# Patient Record
Sex: Male | Born: 1955 | Race: White | Hispanic: No | Marital: Single | Smoking: Never smoker
Health system: Southern US, Community
[De-identification: ages and names within clinical notes are randomized; demographics above are authoritative.]

## PROBLEM LIST (undated history)

## (undated) DIAGNOSIS — I10 Essential (primary) hypertension: Secondary | ICD-10-CM

## (undated) DIAGNOSIS — R188 Other ascites: Secondary | ICD-10-CM

## (undated) DIAGNOSIS — M1990 Unspecified osteoarthritis, unspecified site: Secondary | ICD-10-CM

## (undated) DIAGNOSIS — I8500 Esophageal varices without bleeding: Secondary | ICD-10-CM

## (undated) DIAGNOSIS — IMO0001 Reserved for inherently not codable concepts without codable children: Secondary | ICD-10-CM

## (undated) DIAGNOSIS — Q825 Congenital non-neoplastic nevus: Secondary | ICD-10-CM

## (undated) DIAGNOSIS — F32A Depression, unspecified: Secondary | ICD-10-CM

## (undated) DIAGNOSIS — J189 Pneumonia, unspecified organism: Secondary | ICD-10-CM

## (undated) DIAGNOSIS — I951 Orthostatic hypotension: Secondary | ICD-10-CM

## (undated) DIAGNOSIS — K7460 Unspecified cirrhosis of liver: Secondary | ICD-10-CM

## (undated) DIAGNOSIS — K219 Gastro-esophageal reflux disease without esophagitis: Secondary | ICD-10-CM

## (undated) DIAGNOSIS — F329 Major depressive disorder, single episode, unspecified: Secondary | ICD-10-CM

## (undated) DIAGNOSIS — E871 Hypo-osmolality and hyponatremia: Secondary | ICD-10-CM

## (undated) DIAGNOSIS — G2581 Restless legs syndrome: Secondary | ICD-10-CM

## (undated) DIAGNOSIS — K469 Unspecified abdominal hernia without obstruction or gangrene: Secondary | ICD-10-CM

## (undated) HISTORY — DX: Other ascites: R18.8

## (undated) HISTORY — PX: COLONOSCOPY: SHX174

## (undated) HISTORY — DX: Orthostatic hypotension: I95.1

## (undated) HISTORY — DX: Esophageal varices without bleeding: I85.00

---

## 2012-12-26 ENCOUNTER — Other Ambulatory Visit: Payer: Self-pay | Admitting: Orthopedic Surgery

## 2012-12-26 DIAGNOSIS — M25512 Pain in left shoulder: Secondary | ICD-10-CM

## 2012-12-28 ENCOUNTER — Ambulatory Visit
Admission: RE | Admit: 2012-12-28 | Discharge: 2012-12-28 | Disposition: A | Discharge status: Home / Self Care | Payer: BC Managed Care – PPO | Source: Ambulatory Visit | Attending: Orthopedic Surgery | Admitting: Orthopedic Surgery

## 2012-12-28 ENCOUNTER — Other Ambulatory Visit: Payer: Self-pay | Admitting: Orthopedic Surgery

## 2012-12-28 DIAGNOSIS — M25512 Pain in left shoulder: Secondary | ICD-10-CM

## 2013-01-05 ENCOUNTER — Encounter (HOSPITAL_COMMUNITY): Payer: Self-pay | Admitting: Respiratory Therapy

## 2013-01-15 ENCOUNTER — Encounter (HOSPITAL_COMMUNITY): Payer: Self-pay | Admitting: *Deleted

## 2013-01-15 MED ORDER — CEFAZOLIN SODIUM-DEXTROSE 2-3 GM-% IV SOLR
2.0000 g | INTRAVENOUS | Status: DC
  Filled 2013-01-15: qty 50

## 2013-01-15 NOTE — Progress Notes (Signed)
01/15/13 1100  OBSTRUCTIVE SLEEP APNEA  Have you ever been diagnosed with sleep apnea through a sleep study? No  Do you snore loudly (loud enough to be heard through closed doors)?  1  Do you often feel tired, fatigued, or sleepy during the daytime? 0  Has anyone observed you stop breathing during your sleep? 1  Do you have, or are you being treated for high blood pressure? 1  BMI more than 35 kg/m2? 0  Age over 55 years old? 1  Neck circumference greater than 40 cm/18 inches? 0  Gender: 1  Obstructive Sleep Apnea Score 5

## 2013-01-16 ENCOUNTER — Other Ambulatory Visit (HOSPITAL_COMMUNITY): Payer: BC Managed Care – PPO

## 2013-01-16 ENCOUNTER — Other Ambulatory Visit (HOSPITAL_COMMUNITY): Payer: Self-pay | Admitting: Orthopedic Surgery

## 2013-01-16 ENCOUNTER — Ambulatory Visit (HOSPITAL_COMMUNITY)
Admission: RE | Admit: 2013-01-16 | Discharge: 2013-01-16 | Disposition: A | Discharge status: Home / Self Care | Payer: BC Managed Care – PPO | Source: Ambulatory Visit | Attending: Orthopedic Surgery | Admitting: Orthopedic Surgery

## 2013-01-16 ENCOUNTER — Encounter (HOSPITAL_COMMUNITY)
Admission: RE | Disposition: A | Discharge status: Home / Self Care | Payer: Self-pay | Source: Ambulatory Visit | Attending: Orthopedic Surgery

## 2013-01-16 ENCOUNTER — Encounter (HOSPITAL_COMMUNITY): Payer: Self-pay | Admitting: Surgery

## 2013-01-16 ENCOUNTER — Inpatient Hospital Stay (HOSPITAL_COMMUNITY): Payer: BC Managed Care – PPO

## 2013-01-16 DIAGNOSIS — M129 Arthropathy, unspecified: Secondary | ICD-10-CM | POA: Insufficient documentation

## 2013-01-16 DIAGNOSIS — K649 Unspecified hemorrhoids: Secondary | ICD-10-CM | POA: Insufficient documentation

## 2013-01-16 DIAGNOSIS — Z538 Procedure and treatment not carried out for other reasons: Secondary | ICD-10-CM | POA: Insufficient documentation

## 2013-01-16 DIAGNOSIS — Z01818 Encounter for other preprocedural examination: Secondary | ICD-10-CM | POA: Insufficient documentation

## 2013-01-16 DIAGNOSIS — X58XXXA Exposure to other specified factors, initial encounter: Secondary | ICD-10-CM | POA: Insufficient documentation

## 2013-01-16 DIAGNOSIS — S51809A Unspecified open wound of unspecified forearm, initial encounter: Secondary | ICD-10-CM | POA: Insufficient documentation

## 2013-01-16 DIAGNOSIS — K219 Gastro-esophageal reflux disease without esophagitis: Secondary | ICD-10-CM | POA: Insufficient documentation

## 2013-01-16 DIAGNOSIS — F411 Generalized anxiety disorder: Secondary | ICD-10-CM | POA: Insufficient documentation

## 2013-01-16 DIAGNOSIS — I10 Essential (primary) hypertension: Secondary | ICD-10-CM | POA: Insufficient documentation

## 2013-01-16 DIAGNOSIS — Z79899 Other long term (current) drug therapy: Secondary | ICD-10-CM | POA: Insufficient documentation

## 2013-01-16 DIAGNOSIS — M19019 Primary osteoarthritis, unspecified shoulder: Secondary | ICD-10-CM | POA: Insufficient documentation

## 2013-01-16 HISTORY — DX: Essential (primary) hypertension: I10

## 2013-01-16 HISTORY — DX: Gastro-esophageal reflux disease without esophagitis: K21.9

## 2013-01-16 HISTORY — DX: Unspecified osteoarthritis, unspecified site: M19.90

## 2013-01-16 LAB — CBC WITH DIFFERENTIAL/PLATELET
Basophils Absolute: 0.1 10*3/uL (ref 0.0–0.1)
Eosinophils Absolute: 0.2 10*3/uL (ref 0.0–0.7)
HCT: 43.3 % (ref 39.0–52.0)
Lymphs Abs: 1 10*3/uL (ref 0.7–4.0)
MCH: 32.3 pg (ref 26.0–34.0)
MCHC: 36.1 g/dL — ABNORMAL HIGH (ref 30.0–36.0)
MCV: 86.8 fL (ref 78.0–100.0)
Monocytes Absolute: 0.8 10*3/uL (ref 0.1–1.0)
Neutro Abs: 2.9 10*3/uL (ref 1.7–7.7)
RDW: 12.5 % (ref 11.5–15.5)

## 2013-01-16 LAB — URINALYSIS, ROUTINE W REFLEX MICROSCOPIC
Bilirubin Urine: NEGATIVE
Hgb urine dipstick: NEGATIVE
Ketones, ur: NEGATIVE mg/dL
Nitrite: NEGATIVE
pH: 7.5 (ref 5.0–8.0)

## 2013-01-16 LAB — COMPREHENSIVE METABOLIC PANEL
Albumin: 4.4 g/dL (ref 3.5–5.2)
BUN: 7 mg/dL (ref 6–23)
Creatinine, Ser: 0.81 mg/dL (ref 0.50–1.35)
GFR calc Af Amer: >90 mL/min (ref >90)
Glucose, Bld: 122 mg/dL — ABNORMAL HIGH (ref 70–99)
Total Protein: 8.8 g/dL — ABNORMAL HIGH (ref 6.0–8.3)

## 2013-01-16 LAB — PROTIME-INR
INR: 1.16 (ref 0.00–1.49)
Prothrombin Time: 14.6 seconds (ref 11.6–15.2)

## 2013-01-16 LAB — TYPE AND SCREEN: Antibody Screen: NEGATIVE

## 2013-01-16 LAB — APTT: aPTT: 33 seconds (ref 24–37)

## 2013-01-16 SURGERY — CANCELLED PROCEDURE
Laterality: Left

## 2013-01-16 MED ORDER — MUPIROCIN 2 % EX OINT
TOPICAL_OINTMENT | Freq: Two times a day (BID) | CUTANEOUS | Status: DC
  Administered 2013-01-16: 1 via NASAL
  Filled 2013-01-16: qty 22

## 2013-01-16 MED ORDER — POVIDONE-IODINE 7.5 % EX SOLN: Freq: Once | CUTANEOUS | Status: DC

## 2013-01-16 MED ORDER — FENTANYL CITRATE 0.05 MG/ML IJ SOLN
INTRAMUSCULAR | Status: AC
  Filled 2013-01-16: qty 2

## 2013-01-16 MED ORDER — MIDAZOLAM HCL 2 MG/2ML IJ SOLN
INTRAMUSCULAR | Status: AC
  Filled 2013-01-16: qty 2

## 2013-01-16 SURGICAL SUPPLY — 62 items
BLADE SAW SAG 73X25 THK (BLADE) ×1
BLADE SAW SGTL 73X25 THK (BLADE) ×1 IMPLANT
BLADE SURG 15 STRL LF DISP TIS (BLADE) ×1 IMPLANT
BLADE SURG 15 STRL SS (BLADE) ×1
BOWL SMART MIX CTS (DISPOSABLE) IMPLANT
CHLORAPREP W/TINT 26ML (MISCELLANEOUS) ×2 IMPLANT
CLOTH BEACON ORANGE TIMEOUT ST (SAFETY) ×2 IMPLANT
COVER SURGICAL LIGHT HANDLE (MISCELLANEOUS) ×2 IMPLANT
DRAPE INCISE IOBAN 66X45 STRL (DRAPES) ×2 IMPLANT
DRAPE SURG 17X23 STRL (DRAPES) ×2 IMPLANT
DRAPE U-SHAPE 47X51 STRL (DRAPES) ×2 IMPLANT
DRSG ADAPTIC 3X8 NADH LF (GAUZE/BANDAGES/DRESSINGS) IMPLANT
DRSG MEPILEX BORDER 4X8 (GAUZE/BANDAGES/DRESSINGS) ×2 IMPLANT
DRSG PAD ABDOMINAL 8X10 ST (GAUZE/BANDAGES/DRESSINGS) ×4 IMPLANT
ELECT BLADE 4.0 EZ CLEAN MEGAD (MISCELLANEOUS) ×2
ELECT REM PT RETURN 9FT ADLT (ELECTROSURGICAL) ×2
ELECTRODE BLDE 4.0 EZ CLN MEGD (MISCELLANEOUS) ×1 IMPLANT
ELECTRODE REM PT RTRN 9FT ADLT (ELECTROSURGICAL) ×1 IMPLANT
EVACUATOR 1/8 PVC DRAIN (DRAIN) ×2 IMPLANT
GLOVE BIO SURGEON STRL SZ7 (GLOVE) ×2 IMPLANT
GLOVE BIO SURGEON STRL SZ7.5 (GLOVE) ×2 IMPLANT
GLOVE BIOGEL PI IND STRL 7.0 (GLOVE) ×1 IMPLANT
GLOVE BIOGEL PI IND STRL 8 (GLOVE) ×1 IMPLANT
GLOVE BIOGEL PI INDICATOR 7.0 (GLOVE) ×1
GLOVE BIOGEL PI INDICATOR 8 (GLOVE) ×1
GOWN PREVENTION PLUS LG XLONG (DISPOSABLE) ×2 IMPLANT
GOWN STRL REIN XL XLG (GOWN DISPOSABLE) ×2 IMPLANT
HANDPIECE INTERPULSE COAX TIP (DISPOSABLE) ×1
HEMOSTAT SURGICEL 2X14 (HEMOSTASIS) ×2 IMPLANT
HOOD PEEL AWAY FACE SHEILD DIS (HOOD) ×4 IMPLANT
KIT BASIN OR (CUSTOM PROCEDURE TRAY) ×2 IMPLANT
KIT ROOM TURNOVER OR (KITS) ×2 IMPLANT
MANIFOLD NEPTUNE II (INSTRUMENTS) ×2 IMPLANT
NEEDLE HYPO 25GX1X1/2 BEV (NEEDLE) IMPLANT
NEEDLE MAYO TROCAR (NEEDLE) ×2 IMPLANT
NS IRRIG 1000ML POUR BTL (IV SOLUTION) ×2 IMPLANT
PACK SHOULDER (CUSTOM PROCEDURE TRAY) ×2 IMPLANT
PAD ARMBOARD 7.5X6 YLW CONV (MISCELLANEOUS) ×4 IMPLANT
SET HNDPC FAN SPRY TIP SCT (DISPOSABLE) ×1 IMPLANT
SLING ARM IMMOBILIZER LRG (SOFTGOODS) ×2 IMPLANT
SLING ARM IMMOBILIZER MED (SOFTGOODS) IMPLANT
SMARTMIX MINI TOWER (MISCELLANEOUS) ×2
SPONGE GAUZE 4X4 12PLY (GAUZE/BANDAGES/DRESSINGS) ×2 IMPLANT
SPONGE LAP 18X18 X RAY DECT (DISPOSABLE) ×2 IMPLANT
SPONGE LAP 4X18 X RAY DECT (DISPOSABLE) ×6 IMPLANT
STRIP CLOSURE SKIN 1/2X4 (GAUZE/BANDAGES/DRESSINGS) ×2 IMPLANT
SUCTION FRAZIER TIP 10 FR DISP (SUCTIONS) ×2 IMPLANT
SUPPORT WRAP ARM LG (MISCELLANEOUS) ×2 IMPLANT
SUT ETHIBOND 2 OS 4 DA (SUTURE) ×6 IMPLANT
SUT ETHIBOND NAB CT1 #1 30IN (SUTURE) ×2 IMPLANT
SUT MNCRL AB 4-0 PS2 18 (SUTURE) ×2 IMPLANT
SUT SILK 2 0 TIES 17X18 (SUTURE) ×1
SUT SILK 2-0 18XBRD TIE BLK (SUTURE) ×1 IMPLANT
SUT VIC AB 0 CTB1 27 (SUTURE) ×2 IMPLANT
SUT VIC AB 2-0 CT1 27 (SUTURE) ×1
SUT VIC AB 2-0 CT1 TAPERPNT 27 (SUTURE) ×1 IMPLANT
SYR CONTROL 10ML LL (SYRINGE) ×2 IMPLANT
TOWEL OR 17X24 6PK STRL BLUE (TOWEL DISPOSABLE) ×2 IMPLANT
TOWEL OR 17X26 10 PK STRL BLUE (TOWEL DISPOSABLE) ×2 IMPLANT
TOWER SMARTMIX MINI (MISCELLANEOUS) ×1 IMPLANT
TRAY FOLEY CATH 14FR (SET/KITS/TRAYS/PACK) IMPLANT
WATER STERILE IRR 1000ML POUR (IV SOLUTION) ×2 IMPLANT

## 2013-01-16 NOTE — Progress Notes (Signed)
Surgery cancelled per Dr. Ave Filter due to sore on Left Arm.  Instructions given to pt. (office will call)

## 2013-01-16 NOTE — H&P (Signed)
Devin Noble is an 57 y.o. male.   Chief Complaint: L shoulder pain HPI: endstage L shoulder shoulder OA.  Scraped L forearm on exposed nail last week.  Past Medical History  Diagnosis Date  . Hypertension   . Anxiety     DUE TO SURGERY  . GERD (gastroesophageal reflux disease)   . Hemorrhoid   . Arthritis     Past Surgical History  Procedure Laterality Date  . No past surgeries      History reviewed. No pertinent family history. Social History:  reports that he has never smoked. He does not have any smokeless tobacco history on file. He reports that he drinks about 14.4 ounces of alcohol per week. He reports that he does not use illicit drugs.  Allergies: No Known Allergies  Medications Prior to Admission  Medication Sig Dispense Refill  . lisinopril-hydrochlorothiazide (PRINZIDE,ZESTORETIC) 20-25 MG per tablet Take 1 tablet by mouth daily.      . meloxicam (MOBIC) 7.5 MG tablet Take 7.5 mg by mouth 4 (four) times daily as needed for pain.        Results for orders placed during the hospital encounter of 01/16/13 (from the past 48 hour(s))  APTT     Status: None   Collection Time    01/16/13  8:23 AM      Result Value Range   aPTT 33  24 - 37 seconds  CBC WITH DIFFERENTIAL     Status: Abnormal (Preliminary result)   Collection Time    01/16/13  8:23 AM      Result Value Range   WBC 5.0  4.0 - 10.5 K/uL   RBC 4.99  4.22 - 5.81 MIL/uL   Hemoglobin 15.0  13.0 - 17.0 g/dL   Comment: RESULTS CONFIRMED BY MANUAL DILUTION   HCT 43.3  39.0 - 52.0 %   MCV 86.8  78.0 - 100.0 fL   MCH 32.3  26.0 - 34.0 pg   MCHC 36.1 (*) 30.0 - 36.0 g/dL   Comment: RESULTS CONFIRMED BY MANUAL DILUTION   RDW 12.5  11.5 - 15.5 %   Platelets 181  150 - 400 K/uL   Neutrophils Relative PENDING  43 - 77 %   Neutro Abs PENDING  1.7 - 7.7 K/uL   Band Neutrophils PENDING  0 - 10 %   Lymphocytes Relative PENDING  12 - 46 %   Lymphs Abs PENDING  0.7 - 4.0 K/uL   Monocytes Relative PENDING  3 -  12 %   Monocytes Absolute PENDING  0.1 - 1.0 K/uL   Eosinophils Relative PENDING  0 - 5 %   Eosinophils Absolute PENDING  0.0 - 0.7 K/uL   Basophils Relative PENDING  0 - 1 %   Basophils Absolute PENDING  0.0 - 0.1 K/uL   WBC Morphology PENDING     RBC Morphology PENDING     Smear Review PENDING     nRBC PENDING  0 /100 WBC   Metamyelocytes Relative PENDING     Myelocytes PENDING     Promyelocytes Absolute PENDING     Blasts PENDING    COMPREHENSIVE METABOLIC PANEL     Status: Abnormal   Collection Time    01/16/13  8:23 AM      Result Value Range   Sodium 132 (*) 135 - 145 mEq/L   Potassium 4.1  3.5 - 5.1 mEq/L   Chloride 93 (*) 96 - 112 mEq/L   CO2 26  19 -  32 mEq/L   Glucose, Bld 122 (*) 70 - 99 mg/dL   BUN 7  6 - 23 mg/dL   Creatinine, Ser 2.13  0.50 - 1.35 mg/dL   Calcium 08.6  8.4 - 57.8 mg/dL   Total Protein 8.8 (*) 6.0 - 8.3 g/dL   Albumin 4.4  3.5 - 5.2 g/dL   AST 97 (*) 0 - 37 U/L   ALT 74 (*) 0 - 53 U/L   Alkaline Phosphatase 147 (*) 39 - 117 U/L   Total Bilirubin 1.1  0.3 - 1.2 mg/dL   GFR calc non Af Amer >90  >90 mL/min   GFR calc Af Amer >90  >90 mL/min   Comment:            The eGFR has been calculated     using the CKD EPI equation.     This calculation has not been     validated in all clinical     situations.     eGFR's persistently     <90 mL/min signify     possible Chronic Kidney Disease.  PROTIME-INR     Status: None   Collection Time    01/16/13  8:23 AM      Result Value Range   Prothrombin Time 14.6  11.6 - 15.2 seconds   INR 1.16  0.00 - 1.49  URINALYSIS, ROUTINE W REFLEX MICROSCOPIC     Status: Abnormal   Collection Time    01/16/13  8:31 AM      Result Value Range   Color, Urine YELLOW  YELLOW   APPearance CLEAR  CLEAR   Specific Gravity, Urine 1.012  1.005 - 1.030   pH 7.5  5.0 - 8.0   Glucose, UA NEGATIVE  NEGATIVE mg/dL   Hgb urine dipstick NEGATIVE  NEGATIVE   Bilirubin Urine NEGATIVE  NEGATIVE   Ketones, ur NEGATIVE   NEGATIVE mg/dL   Protein, ur NEGATIVE  NEGATIVE mg/dL   Urobilinogen, UA 1.0  0.0 - 1.0 mg/dL   Nitrite NEGATIVE  NEGATIVE   Leukocytes, UA SMALL (*) NEGATIVE  URINE MICROSCOPIC-ADD ON     Status: None   Collection Time    01/16/13  8:31 AM      Result Value Range   WBC, UA 3-6  <3 WBC/hpf  TYPE AND SCREEN     Status: None   Collection Time    01/16/13  8:35 AM      Result Value Range   ABO/RH(D) B POS     Antibody Screen NEG     Sample Expiration 01/19/2013     No results found.  Review of Systems  Skin:       Scrape from a nail on L forearm.  Mild surrounding erythema.  All other systems reviewed and are negative.    Blood pressure 136/96, pulse 95, temperature 98.2 F (36.8 C), temperature source Oral, resp. rate 18, height 5\' 11"  (1.803 m), weight 101.6 kg (223 lb 15.8 oz), SpO2 95.00%. Physical Exam  Constitutional: He is oriented to person, place, and time. He appears well-developed and well-nourished.  HENT:  Head: Atraumatic.  Cardiovascular: Intact distal pulses.   Respiratory: Effort normal.  Musculoskeletal:  Small eschar on dorsal L forearm, mild surrounding erythema  Neurological: He is alert and oriented to person, place, and time.  Skin: Skin is warm and dry.  Psychiatric: He has a normal mood and affect.     Assessment/Plan L endstage OA Recent skin wound on affected  extremity, likely colonized To minimize risk in this elective surgery, will reschedule after wound has healed.   Mable Paris 01/16/2013, 10:14 AM

## 2013-01-16 NOTE — Anesthesia Preprocedure Evaluation (Addendum)
Anesthesia Evaluation  Patient identified by MRN, date of birth, ID band Patient awake    Reviewed: Allergy & Precautions, H&P , NPO status , Patient's Chart, lab work & pertinent test results, reviewed documented beta blocker date and time   Airway Mallampati: II TM Distance: >3 FB Neck ROM: full    Dental  (+) Teeth Intact and Dental Advisory Given   Pulmonary neg pulmonary ROS,  breath sounds clear to auscultation        Cardiovascular hypertension, negative cardio ROS  Rhythm:regular     Neuro/Psych Anxiety negative neurological ROS     GI/Hepatic Neg liver ROS, GERD-  Medicated and Controlled,  Endo/Other  negative endocrine ROS  Renal/GU negative Renal ROS  negative genitourinary   Musculoskeletal   Abdominal   Peds  Hematology negative hematology ROS (+)   Anesthesia Other Findings See surgeon's H&P   Reproductive/Obstetrics negative OB ROS                          Anesthesia Physical Anesthesia Plan  ASA: II  Anesthesia Plan: General   Post-op Pain Management:    Induction: Intravenous  Airway Management Planned: Oral ETT  Additional Equipment:   Intra-op Plan:   Post-operative Plan: Extubation in OR  Informed Consent: I have reviewed the patients History and Physical, chart, labs and discussed the procedure including the risks, benefits and alternatives for the proposed anesthesia with the patient or authorized representative who has indicated his/her understanding and acceptance.   Dental Advisory Given and Dental advisory given  Plan Discussed with: CRNA, Surgeon and Anesthesiologist  Anesthesia Plan Comments:        Anesthesia Quick Evaluation

## 2013-01-17 ENCOUNTER — Other Ambulatory Visit: Payer: Self-pay | Admitting: Orthopedic Surgery

## 2013-01-23 ENCOUNTER — Encounter (HOSPITAL_COMMUNITY): Payer: Self-pay | Admitting: Pharmacy Technician

## 2013-01-23 ENCOUNTER — Other Ambulatory Visit (HOSPITAL_COMMUNITY): Payer: Self-pay

## 2013-01-24 ENCOUNTER — Other Ambulatory Visit (HOSPITAL_COMMUNITY): Payer: BC Managed Care – PPO

## 2013-01-25 MED ORDER — THROMBIN 20000 UNITS EX SOLR
CUTANEOUS | Status: AC
  Filled 2013-01-25: qty 20000

## 2013-01-25 MED ORDER — BUPIVACAINE-EPINEPHRINE PF 0.25-1:200000 % IJ SOLN
INTRAMUSCULAR | Status: AC
  Filled 2013-01-25: qty 30

## 2013-01-29 ENCOUNTER — Encounter (HOSPITAL_COMMUNITY): Payer: Self-pay | Admitting: *Deleted

## 2013-01-29 MED ORDER — CEFAZOLIN SODIUM-DEXTROSE 2-3 GM-% IV SOLR
2.0000 g | INTRAVENOUS | Status: AC
  Administered 2013-01-30: 2 g via INTRAVENOUS
  Filled 2013-01-29: qty 50

## 2013-01-29 NOTE — Progress Notes (Signed)
01/29/13 0841  OBSTRUCTIVE SLEEP APNEA  Have you ever been diagnosed with sleep apnea through a sleep study? No  Do you snore loudly (loud enough to be heard through closed doors)?  1  Do you often feel tired, fatigued, or sleepy during the daytime? 0  Has anyone observed you stop breathing during your sleep? 1  Do you have, or are you being treated for high blood pressure? 1  BMI more than 35 kg/m2? 0  Age over 56 years old? 1  Neck circumference greater than 40 cm/18 inches? 0  Gender: 1  Obstructive Sleep Apnea Score 5  Score 4 or greater  Results sent to PCP

## 2013-01-29 NOTE — Progress Notes (Signed)
Spoke with pt and informed him of surgery time change.  Instructed to arrive at 0730 per OR request.

## 2013-01-30 ENCOUNTER — Encounter (HOSPITAL_COMMUNITY)
Admission: RE | Disposition: A | Discharge status: Home / Self Care | Payer: Self-pay | Source: Ambulatory Visit | Attending: Orthopedic Surgery

## 2013-01-30 ENCOUNTER — Ambulatory Visit (HOSPITAL_COMMUNITY): Payer: BC Managed Care – PPO | Admitting: Anesthesiology

## 2013-01-30 ENCOUNTER — Inpatient Hospital Stay (HOSPITAL_COMMUNITY): Payer: BC Managed Care – PPO

## 2013-01-30 ENCOUNTER — Encounter (HOSPITAL_COMMUNITY): Payer: Self-pay | Admitting: Anesthesiology

## 2013-01-30 ENCOUNTER — Encounter (HOSPITAL_COMMUNITY): Payer: Self-pay | Admitting: *Deleted

## 2013-01-30 ENCOUNTER — Inpatient Hospital Stay (HOSPITAL_COMMUNITY)
Admission: RE | Admit: 2013-01-30 | Discharge: 2013-01-31 | DRG: 491 | Disposition: A | Discharge status: Home / Self Care | Payer: BC Managed Care – PPO | Source: Ambulatory Visit | Attending: Orthopedic Surgery | Admitting: Orthopedic Surgery

## 2013-01-30 DIAGNOSIS — M19019 Primary osteoarthritis, unspecified shoulder: Principal | ICD-10-CM | POA: Diagnosis present

## 2013-01-30 DIAGNOSIS — Z96612 Presence of left artificial shoulder joint: Secondary | ICD-10-CM

## 2013-01-30 DIAGNOSIS — Z79899 Other long term (current) drug therapy: Secondary | ICD-10-CM

## 2013-01-30 DIAGNOSIS — M19012 Primary osteoarthritis, left shoulder: Secondary | ICD-10-CM | POA: Diagnosis present

## 2013-01-30 DIAGNOSIS — F411 Generalized anxiety disorder: Secondary | ICD-10-CM | POA: Diagnosis present

## 2013-01-30 DIAGNOSIS — I10 Essential (primary) hypertension: Secondary | ICD-10-CM | POA: Diagnosis present

## 2013-01-30 DIAGNOSIS — K219 Gastro-esophageal reflux disease without esophagitis: Secondary | ICD-10-CM | POA: Diagnosis present

## 2013-01-30 HISTORY — PX: TOTAL SHOULDER ARTHROPLASTY: SHX126

## 2013-01-30 HISTORY — DX: Congenital non-neoplastic nevus: Q82.5

## 2013-01-30 LAB — CBC WITH DIFFERENTIAL/PLATELET
Basophils Relative: 2 % — ABNORMAL HIGH (ref 0–1)
Eosinophils Absolute: 0.2 10*3/uL (ref 0.0–0.7)
Eosinophils Relative: 5 % (ref 0–5)
Hemoglobin: 14.6 g/dL (ref 13.0–17.0)
Lymphs Abs: 0.8 10*3/uL (ref 0.7–4.0)
MCH: 32.2 pg (ref 26.0–34.0)
MCHC: 37 g/dL — ABNORMAL HIGH (ref 30.0–36.0)
MCV: 87 fL (ref 78.0–100.0)
Monocytes Absolute: 0.5 10*3/uL (ref 0.1–1.0)
Monocytes Relative: 14 % — ABNORMAL HIGH (ref 3–12)
Neutrophils Relative %: 55 % (ref 43–77)
RBC: 4.54 MIL/uL (ref 4.22–5.81)

## 2013-01-30 LAB — URINALYSIS, ROUTINE W REFLEX MICROSCOPIC
Hgb urine dipstick: NEGATIVE
Nitrite: NEGATIVE
Protein, ur: NEGATIVE mg/dL
Specific Gravity, Urine: 1.007 (ref 1.005–1.030)
Urobilinogen, UA: 1 mg/dL (ref 0.0–1.0)

## 2013-01-30 LAB — COMPREHENSIVE METABOLIC PANEL
Albumin: 4.1 g/dL (ref 3.5–5.2)
Alkaline Phosphatase: 126 U/L — ABNORMAL HIGH (ref 39–117)
BUN: 6 mg/dL (ref 6–23)
Calcium: 9.6 mg/dL (ref 8.4–10.5)
Creatinine, Ser: 0.75 mg/dL (ref 0.50–1.35)
GFR calc Af Amer: >90 mL/min (ref >90)
Glucose, Bld: 97 mg/dL (ref 70–99)
Total Protein: 8.2 g/dL (ref 6.0–8.3)

## 2013-01-30 LAB — PROTIME-INR
INR: 1.08 (ref 0.00–1.49)
Prothrombin Time: 13.9 seconds (ref 11.6–15.2)

## 2013-01-30 LAB — TYPE AND SCREEN

## 2013-01-30 SURGERY — ARTHROPLASTY, SHOULDER, TOTAL
Anesthesia: General | Site: Shoulder | Laterality: Left | Wound class: Clean

## 2013-01-30 MED ORDER — ROCURONIUM BROMIDE 100 MG/10ML IV SOLN
INTRAVENOUS | Status: DC | PRN
  Administered 2013-01-30: 50 mg via INTRAVENOUS
  Administered 2013-01-30: 10 mg via INTRAVENOUS
  Administered 2013-01-30 (×2): 20 mg via INTRAVENOUS

## 2013-01-30 MED ORDER — ONDANSETRON HCL 4 MG/2ML IJ SOLN: 4.0000 mg | Freq: Four times a day (QID) | INTRAMUSCULAR | Status: DC | PRN

## 2013-01-30 MED ORDER — ALUM & MAG HYDROXIDE-SIMETH 200-200-20 MG/5ML PO SUSP: 30.0000 mL | ORAL | Status: DC | PRN

## 2013-01-30 MED ORDER — MENTHOL 3 MG MT LOZG: 1.0000 | LOZENGE | OROMUCOSAL | Status: DC | PRN

## 2013-01-30 MED ORDER — METOCLOPRAMIDE HCL 5 MG/ML IJ SOLN: 10.0000 mg | Freq: Once | INTRAMUSCULAR | Status: DC | PRN

## 2013-01-30 MED ORDER — NEOSTIGMINE METHYLSULFATE 1 MG/ML IJ SOLN
INTRAMUSCULAR | Status: DC | PRN
Start: 2013-01-30 — End: 2013-01-30
  Administered 2013-01-30: 4 mg via INTRAVENOUS

## 2013-01-30 MED ORDER — ONDANSETRON HCL 4 MG/2ML IJ SOLN
INTRAMUSCULAR | Status: DC | PRN
  Administered 2013-01-30: 4 mg via INTRAVENOUS

## 2013-01-30 MED ORDER — LIDOCAINE HCL 4 % MT SOLN
OROMUCOSAL | Status: DC | PRN
  Administered 2013-01-30: 4 mL via TOPICAL

## 2013-01-30 MED ORDER — POVIDONE-IODINE 7.5 % EX SOLN: Freq: Once | CUTANEOUS | Status: DC

## 2013-01-30 MED ORDER — LIDOCAINE HCL (CARDIAC) 20 MG/ML IV SOLN
INTRAVENOUS | Status: DC | PRN
  Administered 2013-01-30: 100 mg via INTRAVENOUS

## 2013-01-30 MED ORDER — LACTATED RINGERS IV SOLN
INTRAVENOUS | Status: DC | PRN
  Administered 2013-01-30 (×2): via INTRAVENOUS

## 2013-01-30 MED ORDER — HYDROMORPHONE HCL PF 1 MG/ML IJ SOLN: 0.2500 mg | INTRAMUSCULAR | Status: DC | PRN

## 2013-01-30 MED ORDER — LISINOPRIL 20 MG PO TABS
20.0000 mg | ORAL_TABLET | Freq: Every day | ORAL | Status: DC
  Filled 2013-01-30 (×2): qty 1

## 2013-01-30 MED ORDER — OXYCODONE-ACETAMINOPHEN 5-325 MG PO TABS: 1.0000 | ORAL_TABLET | ORAL | Status: DC | PRN

## 2013-01-30 MED ORDER — ASPIRIN EC 325 MG PO TBEC
325.0000 mg | DELAYED_RELEASE_TABLET | Freq: Two times a day (BID) | ORAL | Status: DC
  Administered 2013-01-30 – 2013-01-31 (×2): 325 mg via ORAL
  Filled 2013-01-30 (×4): qty 1

## 2013-01-30 MED ORDER — DEXAMETHASONE SODIUM PHOSPHATE 4 MG/ML IJ SOLN
INTRAMUSCULAR | Status: DC | PRN
  Administered 2013-01-30: 10 mg

## 2013-01-30 MED ORDER — HEMOSTATIC AGENTS (NO CHARGE) OPTIME
TOPICAL | Status: DC | PRN
  Administered 2013-01-30: 1 via TOPICAL

## 2013-01-30 MED ORDER — FLEET ENEMA 7-19 GM/118ML RE ENEM: 1.0000 | ENEMA | Freq: Once | RECTAL | Status: AC | PRN

## 2013-01-30 MED ORDER — PROMETHAZINE HCL 25 MG/ML IJ SOLN: 6.2500 mg | INTRAMUSCULAR | Status: DC | PRN

## 2013-01-30 MED ORDER — SODIUM CHLORIDE 0.9 % IR SOLN
Status: DC | PRN
  Administered 2013-01-30: 3000 mL

## 2013-01-30 MED ORDER — LISINOPRIL-HYDROCHLOROTHIAZIDE 20-25 MG PO TABS: 1.0000 | ORAL_TABLET | Freq: Every day | ORAL | Status: DC

## 2013-01-30 MED ORDER — OXYCODONE HCL 5 MG PO TABS: 5.0000 mg | ORAL_TABLET | Freq: Once | ORAL | Status: DC | PRN

## 2013-01-30 MED ORDER — MIDAZOLAM HCL 2 MG/2ML IJ SOLN
INTRAMUSCULAR | Status: AC
  Administered 2013-01-30: 2 mg
  Filled 2013-01-30: qty 2

## 2013-01-30 MED ORDER — HYDROCODONE-ACETAMINOPHEN 5-325 MG PO TABS: 1.0000 | ORAL_TABLET | ORAL | Status: DC | PRN

## 2013-01-30 MED ORDER — SODIUM CHLORIDE 0.9 % IV SOLN
10.0000 mg | INTRAVENOUS | Status: DC | PRN
  Administered 2013-01-30: 40 ug/min via INTRAVENOUS

## 2013-01-30 MED ORDER — METOCLOPRAMIDE HCL 5 MG/ML IJ SOLN: 5.0000 mg | Freq: Three times a day (TID) | INTRAMUSCULAR | Status: DC | PRN

## 2013-01-30 MED ORDER — SODIUM CHLORIDE 0.9 % IV SOLN: INTRAVENOUS | Status: DC

## 2013-01-30 MED ORDER — HYDROCHLOROTHIAZIDE 25 MG PO TABS
25.0000 mg | ORAL_TABLET | Freq: Every day | ORAL | Status: DC
  Filled 2013-01-30 (×2): qty 1

## 2013-01-30 MED ORDER — OXYCODONE HCL 5 MG/5ML PO SOLN: 5.0000 mg | Freq: Once | ORAL | Status: DC | PRN

## 2013-01-30 MED ORDER — FENTANYL CITRATE 0.05 MG/ML IJ SOLN
INTRAMUSCULAR | Status: AC
  Filled 2013-01-30: qty 2

## 2013-01-30 MED ORDER — POLYETHYLENE GLYCOL 3350 17 G PO PACK: 17.0000 g | PACK | Freq: Every day | ORAL | Status: DC | PRN

## 2013-01-30 MED ORDER — ZOLPIDEM TARTRATE 5 MG PO TABS: 5.0000 mg | ORAL_TABLET | Freq: Every evening | ORAL | Status: DC | PRN

## 2013-01-30 MED ORDER — DIPHENHYDRAMINE HCL 12.5 MG/5ML PO ELIX: 12.5000 mg | ORAL_SOLUTION | ORAL | Status: DC | PRN

## 2013-01-30 MED ORDER — BUPIVACAINE-EPINEPHRINE PF 0.5-1:200000 % IJ SOLN
INTRAMUSCULAR | Status: DC | PRN
  Administered 2013-01-30: 150 mg

## 2013-01-30 MED ORDER — BISACODYL 5 MG PO TBEC: 5.0000 mg | DELAYED_RELEASE_TABLET | Freq: Every day | ORAL | Status: DC | PRN

## 2013-01-30 MED ORDER — MIDAZOLAM HCL 5 MG/5ML IJ SOLN
INTRAMUSCULAR | Status: DC | PRN
  Administered 2013-01-30: 2 mg via INTRAVENOUS

## 2013-01-30 MED ORDER — CEFAZOLIN SODIUM-DEXTROSE 2-3 GM-% IV SOLR
2.0000 g | Freq: Four times a day (QID) | INTRAVENOUS | Status: AC
  Administered 2013-01-30 – 2013-01-31 (×3): 2 g via INTRAVENOUS
  Filled 2013-01-30 (×3): qty 50

## 2013-01-30 MED ORDER — ONDANSETRON HCL 4 MG PO TABS: 4.0000 mg | ORAL_TABLET | Freq: Four times a day (QID) | ORAL | Status: DC | PRN

## 2013-01-30 MED ORDER — VECURONIUM BROMIDE 10 MG IV SOLR
INTRAVENOUS | Status: DC | PRN
  Administered 2013-01-30: 1 mg via INTRAVENOUS
  Administered 2013-01-30: 3 mg via INTRAVENOUS

## 2013-01-30 MED ORDER — OXYCODONE HCL 5 MG PO TABS
5.0000 mg | ORAL_TABLET | ORAL | Status: DC | PRN
  Administered 2013-01-30 – 2013-01-31 (×2): 10 mg via ORAL
  Filled 2013-01-30 (×2): qty 2

## 2013-01-30 MED ORDER — MORPHINE SULFATE 2 MG/ML IJ SOLN: 2.0000 mg | INTRAMUSCULAR | Status: DC | PRN

## 2013-01-30 MED ORDER — PHENYLEPHRINE HCL 10 MG/ML IJ SOLN
INTRAMUSCULAR | Status: DC | PRN
  Administered 2013-01-30: 80 ug via INTRAVENOUS
  Administered 2013-01-30 (×2): 40 ug via INTRAVENOUS

## 2013-01-30 MED ORDER — ACETAMINOPHEN 650 MG RE SUPP: 650.0000 mg | Freq: Four times a day (QID) | RECTAL | Status: DC | PRN

## 2013-01-30 MED ORDER — ACETAMINOPHEN 325 MG PO TABS: 650.0000 mg | ORAL_TABLET | Freq: Four times a day (QID) | ORAL | Status: DC | PRN

## 2013-01-30 MED ORDER — GLYCOPYRROLATE 0.2 MG/ML IJ SOLN
INTRAMUSCULAR | Status: DC | PRN
  Administered 2013-01-30: .6 mg via INTRAVENOUS

## 2013-01-30 MED ORDER — METOCLOPRAMIDE HCL 10 MG PO TABS: 5.0000 mg | ORAL_TABLET | Freq: Three times a day (TID) | ORAL | Status: DC | PRN

## 2013-01-30 MED ORDER — DOCUSATE SODIUM 100 MG PO CAPS
100.0000 mg | ORAL_CAPSULE | Freq: Two times a day (BID) | ORAL | Status: DC
  Administered 2013-01-30: 100 mg via ORAL
  Filled 2013-01-30 (×3): qty 1

## 2013-01-30 MED ORDER — ACETAMINOPHEN 10 MG/ML IV SOLN
1000.0000 mg | Freq: Four times a day (QID) | INTRAVENOUS | Status: DC
  Administered 2013-01-30 – 2013-01-31 (×2): 1000 mg via INTRAVENOUS
  Filled 2013-01-30 (×4): qty 100

## 2013-01-30 MED ORDER — PHENOL 1.4 % MT LIQD: 1.0000 | OROMUCOSAL | Status: DC | PRN

## 2013-01-30 MED ORDER — FENTANYL CITRATE 0.05 MG/ML IJ SOLN
50.0000 ug | INTRAMUSCULAR | Status: DC | PRN
  Administered 2013-01-30: 100 ug via INTRAVENOUS

## 2013-01-30 MED ORDER — LACTATED RINGERS IV SOLN
INTRAVENOUS | Status: DC
  Administered 2013-01-30: 50 mL via INTRAVENOUS

## 2013-01-30 MED ORDER — PROPOFOL 10 MG/ML IV BOLUS
INTRAVENOUS | Status: DC | PRN
  Administered 2013-01-30: 330 mg via INTRAVENOUS

## 2013-01-30 MED ORDER — MIDAZOLAM HCL 2 MG/2ML IJ SOLN: 1.0000 mg | INTRAMUSCULAR | Status: DC | PRN

## 2013-01-30 MED ORDER — FENTANYL CITRATE 0.05 MG/ML IJ SOLN
INTRAMUSCULAR | Status: DC | PRN
  Administered 2013-01-30: 100 ug via INTRAVENOUS

## 2013-01-30 MED ORDER — 0.9 % SODIUM CHLORIDE (POUR BTL) OPTIME
TOPICAL | Status: DC | PRN
  Administered 2013-01-30: 1000 mL

## 2013-01-30 SURGICAL SUPPLY — 78 items
ASSEMBLY NECK TAPER FIXED 135 (Orthopedic Implant) ×2 IMPLANT
BIT DRILL 5/64X5 DISP (BIT) ×2 IMPLANT
BLADE SAW SAG 73X25 THK (BLADE) ×1
BLADE SAW SGTL 73X25 THK (BLADE) ×1 IMPLANT
BLADE SURG 15 STRL LF DISP TIS (BLADE) ×1 IMPLANT
BLADE SURG 15 STRL SS (BLADE) ×1
BOWL SMART MIX CTS (DISPOSABLE) IMPLANT
CEMENT BONE DEPUY (Cement) ×2 IMPLANT
CHLORAPREP W/TINT 26ML (MISCELLANEOUS) ×2 IMPLANT
CLOTH BEACON ORANGE TIMEOUT ST (SAFETY) ×2 IMPLANT
COVER SURGICAL LIGHT HANDLE (MISCELLANEOUS) ×2 IMPLANT
DRAPE INCISE IOBAN 66X45 STRL (DRAPES) ×2 IMPLANT
DRAPE SURG 17X23 STRL (DRAPES) ×2 IMPLANT
DRAPE U-SHAPE 47X51 STRL (DRAPES) ×2 IMPLANT
DRSG ADAPTIC 3X8 NADH LF (GAUZE/BANDAGES/DRESSINGS) IMPLANT
DRSG MEPILEX BORDER 4X8 (GAUZE/BANDAGES/DRESSINGS) IMPLANT
DRSG PAD ABDOMINAL 8X10 ST (GAUZE/BANDAGES/DRESSINGS) ×2 IMPLANT
ELECT BLADE 4.0 EZ CLEAN MEGAD (MISCELLANEOUS) ×2
ELECT REM PT RETURN 9FT ADLT (ELECTROSURGICAL) ×2
ELECTRODE BLDE 4.0 EZ CLN MEGD (MISCELLANEOUS) ×1 IMPLANT
ELECTRODE REM PT RTRN 9FT ADLT (ELECTROSURGICAL) ×1 IMPLANT
EVACUATOR 1/8 PVC DRAIN (DRAIN) ×2 IMPLANT
GLENOID ANCHOR PEG CROSSLK 52 (Orthopedic Implant) ×2 IMPLANT
GLOVE BIO SURGEON STRL SZ7 (GLOVE) ×4 IMPLANT
GLOVE BIO SURGEON STRL SZ7.5 (GLOVE) IMPLANT
GLOVE BIO SURGEON STRL SZ8 (GLOVE) ×2 IMPLANT
GLOVE BIOGEL PI IND STRL 7.0 (GLOVE) ×3 IMPLANT
GLOVE BIOGEL PI IND STRL 8 (GLOVE) ×2 IMPLANT
GLOVE BIOGEL PI INDICATOR 7.0 (GLOVE) ×3
GLOVE BIOGEL PI INDICATOR 8 (GLOVE) ×2
GLOVE SURG SS PI 6.5 STRL IVOR (GLOVE) ×4 IMPLANT
GOWN PREVENTION PLUS LG XLONG (DISPOSABLE) ×2 IMPLANT
GOWN STRL REIN 2XL XLG LVL4 (GOWN DISPOSABLE) ×2 IMPLANT
GOWN STRL REIN XL XLG (GOWN DISPOSABLE) ×6 IMPLANT
HANDPIECE INTERPULSE COAX TIP (DISPOSABLE) ×1
HEMOSTAT SURGICEL 2X14 (HEMOSTASIS) ×2 IMPLANT
HOOD PEEL AWAY FACE SHEILD DIS (HOOD) ×6 IMPLANT
KIT BASIN OR (CUSTOM PROCEDURE TRAY) ×2 IMPLANT
KIT ROOM TURNOVER OR (KITS) ×2 IMPLANT
MANIFOLD NEPTUNE II (INSTRUMENTS) ×2 IMPLANT
NEEDLE HYPO 25GX1X1/2 BEV (NEEDLE) IMPLANT
NEEDLE MAYO TROCAR (NEEDLE) ×2 IMPLANT
NS IRRIG 1000ML POUR BTL (IV SOLUTION) ×2 IMPLANT
PACK SHOULDER (CUSTOM PROCEDURE TRAY) ×2 IMPLANT
PAD ARMBOARD 7.5X6 YLW CONV (MISCELLANEOUS) ×4 IMPLANT
PIN METAGLENE 2.5 (PIN) ×2 IMPLANT
RETRIEVER SUT HEWSON (MISCELLANEOUS) ×2 IMPLANT
SET HNDPC FAN SPRY TIP SCT (DISPOSABLE) ×1 IMPLANT
SLEEVE SURGEON STRL (DRAPES) ×2 IMPLANT
SLING ARM FOAM STRAP XLG (SOFTGOODS) ×2 IMPLANT
SLING ARM IMMOBILIZER LRG (SOFTGOODS) IMPLANT
SLING ARM IMMOBILIZER MED (SOFTGOODS) IMPLANT
SMARTMIX MINI TOWER (MISCELLANEOUS) ×2
SPONGE GAUZE 4X4 12PLY (GAUZE/BANDAGES/DRESSINGS) ×2 IMPLANT
SPONGE LAP 18X18 X RAY DECT (DISPOSABLE) ×2 IMPLANT
SPONGE LAP 4X18 X RAY DECT (DISPOSABLE) IMPLANT
STEM GLOBAL AP 12MM (Stem) ×2 IMPLANT
STRIP CLOSURE SKIN 1/2X4 (GAUZE/BANDAGES/DRESSINGS) ×2 IMPLANT
SUCTION FRAZIER TIP 10 FR DISP (SUCTIONS) ×2 IMPLANT
SUPPORT WRAP ARM LG (MISCELLANEOUS) ×2 IMPLANT
SUT ETHIBOND 2 OS 4 DA (SUTURE) IMPLANT
SUT ETHIBOND NAB CT1 #1 30IN (SUTURE) ×4 IMPLANT
SUT FIBERWIRE #2 38 T-5 BLUE (SUTURE) ×6
SUT MNCRL AB 4-0 PS2 18 (SUTURE) ×2 IMPLANT
SUT SILK 2 0 TIES 17X18 (SUTURE)
SUT SILK 2-0 18XBRD TIE BLK (SUTURE) IMPLANT
SUT VIC AB 0 CTB1 27 (SUTURE) ×2 IMPLANT
SUT VIC AB 2-0 CT1 27 (SUTURE) ×2
SUT VIC AB 2-0 CT1 TAPERPNT 27 (SUTURE) ×2 IMPLANT
SUTURE FIBERWR #2 38 T-5 BLUE (SUTURE) ×3 IMPLANT
SYR CONTROL 10ML LL (SYRINGE) IMPLANT
TOWEL OR 17X24 6PK STRL BLUE (TOWEL DISPOSABLE) ×2 IMPLANT
TOWEL OR 17X26 10 PK STRL BLUE (TOWEL DISPOSABLE) ×2 IMPLANT
TOWER SMARTMIX MINI (MISCELLANEOUS) ×1 IMPLANT
TRAY FOLEY CATH 14FR (SET/KITS/TRAYS/PACK) IMPLANT
WATER STERILE IRR 1000ML POUR (IV SOLUTION) ×2 IMPLANT
YANKAUER SUCT BULB TIP NO VENT (SUCTIONS) ×2 IMPLANT
global unite standard humeral head 52mm x 21mm (Orthopedic Implant) ×2 IMPLANT

## 2013-01-30 NOTE — Anesthesia Procedure Notes (Addendum)
Anesthesia Regional Block:  Interscalene brachial plexus block  Pre-Anesthetic Checklist: ,, timeout performed, Correct Patient, Correct Site, Correct Laterality, Correct Procedure, Correct Position, site marked, Risks and benefits discussed,  Surgical consent,  Pre-op evaluation,  At surgeon's request and post-op pain management  Laterality: Left  Prep: chloraprep       Needles:  Injection technique: Single-shot  Needle Type: Echogenic Stimulator Needle     Needle Length: 5cm 5 cm Needle Gauge: 22 and 22 G    Additional Needles:  Procedures: ultrasound guided (picture in chart) and nerve stimulator Interscalene brachial plexus block  Nerve Stimulator or Paresthesia:  Response: bicep contraction, 0.45 mA,   Additional Responses:   Narrative:  Start time: 01/30/2013 9:24 AM End time: 01/30/2013 9:34 AM Injection made incrementally with aspirations every 5 mL.  Performed by: Personally  Anesthesiologist: J. Adonis Huguenin, MD  Additional Notes: Functioning IV was confirmed and monitors applied.  A 50mm 22ga echogenic arrow stimulator was used. Sterile prep and drape,hand hygiene and sterile gloves were used.Ultrasound guidance: relevant anatomy identified, needle position confirmed, local anesthetic spread visualized around nerve(s)., vascular puncture avoided.  Image printed for medical record.  Negative aspiration and negative test dose prior to incremental administration of local anesthetic. The patient tolerated the procedure well.  Interscalene brachial plexus block Procedure Name: Intubation Date/Time: 01/30/2013 10:50 AM Performed by: Rogelia Boga Pre-anesthesia Checklist: Patient identified, Emergency Drugs available, Suction available, Patient being monitored and Timeout performed Patient Re-evaluated:Patient Re-evaluated prior to inductionOxygen Delivery Method: Circle system utilized Preoxygenation: Pre-oxygenation with 100% oxygen Intubation Type: IV  induction Ventilation: Mask ventilation without difficulty and Oral airway inserted - appropriate to patient size Laryngoscope Size: Mac and 4 Grade View: Grade II Tube type: Oral Tube size: 7.5 mm Number of attempts: 1 Airway Equipment and Method: Stylet Placement Confirmation: ETT inserted through vocal cords under direct vision,  positive ETCO2 and breath sounds checked- equal and bilateral Secured at: 23 cm Tube secured with: Tape Dental Injury: Teeth and Oropharynx as per pre-operative assessment

## 2013-01-30 NOTE — H&P (Signed)
Devin Noble is an 57 y.o. male.   Chief Complaint: L shoulder pain and dysfunction HPI: L shoulder endstage arthritis with bone on bone on Xray. Failed conservative treatment of activity modification, NSAIDs, injections.  Pain is constant and limits quality of life and sleep.  Past Medical History  Diagnosis Date  . Anxiety     DUE TO SURGERY  . Hemorrhoid   . Hypertension     takes Lisinopril/HCTZ daily  . Arthritis     takes Mobic prn  . GERD (gastroesophageal reflux disease)     doesn't take any meds    Past Surgical History  Procedure Laterality Date  . No past surgeries      History reviewed. No pertinent family history. Social History:  reports that he has never smoked. He does not have any smokeless tobacco history on file. He reports that he drinks about 14.4 ounces of alcohol per week. He reports that he does not use illicit drugs.  Allergies: No Known Allergies  Medications Prior to Admission  Medication Sig Dispense Refill  . lisinopril-hydrochlorothiazide (PRINZIDE,ZESTORETIC) 20-25 MG per tablet Take 1 tablet by mouth daily.      . meloxicam (MOBIC) 7.5 MG tablet Take 7.5 mg by mouth 4 (four) times daily as needed for pain.        Results for orders placed during the hospital encounter of 01/30/13 (from the past 48 hour(s))  APTT     Status: None   Collection Time    01/30/13  7:15 AM      Result Value Range   aPTT 33  24 - 37 seconds  CBC WITH DIFFERENTIAL     Status: Abnormal   Collection Time    01/30/13  7:15 AM      Result Value Range   WBC 3.6 (*) 4.0 - 10.5 K/uL   RBC 4.54  4.22 - 5.81 MIL/uL   Hemoglobin 14.6  13.0 - 17.0 g/dL   HCT 16.1  09.6 - 04.5 %   MCV 87.0  78.0 - 100.0 fL   MCH 32.2  26.0 - 34.0 pg   MCHC 37.0 (*) 30.0 - 36.0 g/dL   RDW 40.9  81.1 - 91.4 %   Platelets 135 (*) 150 - 400 K/uL   Neutrophils Relative 55  43 - 77 %   Neutro Abs 2.0  1.7 - 7.7 K/uL   Lymphocytes Relative 23  12 - 46 %   Lymphs Abs 0.8  0.7 - 4.0 K/uL    Monocytes Relative 14 (*) 3 - 12 %   Monocytes Absolute 0.5  0.1 - 1.0 K/uL   Eosinophils Relative 5  0 - 5 %   Eosinophils Absolute 0.2  0.0 - 0.7 K/uL   Basophils Relative 2 (*) 0 - 1 %   Basophils Absolute 0.1  0.0 - 0.1 K/uL  COMPREHENSIVE METABOLIC PANEL     Status: Abnormal   Collection Time    01/30/13  7:15 AM      Result Value Range   Sodium 131 (*) 135 - 145 mEq/L   Potassium 3.9  3.5 - 5.1 mEq/L   Chloride 95 (*) 96 - 112 mEq/L   CO2 23  19 - 32 mEq/L   Glucose, Bld 97  70 - 99 mg/dL   BUN 6  6 - 23 mg/dL   Creatinine, Ser 7.82  0.50 - 1.35 mg/dL   Calcium 9.6  8.4 - 95.6 mg/dL   Total Protein 8.2  6.0 -  8.3 g/dL   Albumin 4.1  3.5 - 5.2 g/dL   AST 94 (*) 0 - 37 U/L   ALT 80 (*) 0 - 53 U/L   Alkaline Phosphatase 126 (*) 39 - 117 U/L   Total Bilirubin 0.5  0.3 - 1.2 mg/dL   GFR calc non Af Amer >90  >90 mL/min   GFR calc Af Amer >90  >90 mL/min   Comment:            The eGFR has been calculated     using the CKD EPI equation.     This calculation has not been     validated in all clinical     situations.     eGFR's persistently     <90 mL/min signify     possible Chronic Kidney Disease.  PROTIME-INR     Status: None   Collection Time    01/30/13  7:15 AM      Result Value Range   Prothrombin Time 13.9  11.6 - 15.2 seconds   INR 1.08  0.00 - 1.49  TYPE AND SCREEN     Status: None   Collection Time    01/30/13  7:20 AM      Result Value Range   ABO/RH(D) B POS     Antibody Screen NEG     Sample Expiration 02/02/2013    URINALYSIS, ROUTINE W REFLEX MICROSCOPIC     Status: None   Collection Time    01/30/13  8:45 AM      Result Value Range   Color, Urine YELLOW  YELLOW   APPearance CLEAR  CLEAR   Specific Gravity, Urine 1.007  1.005 - 1.030   pH 6.0  5.0 - 8.0   Glucose, UA NEGATIVE  NEGATIVE mg/dL   Hgb urine dipstick NEGATIVE  NEGATIVE   Bilirubin Urine NEGATIVE  NEGATIVE   Ketones, ur NEGATIVE  NEGATIVE mg/dL   Protein, ur NEGATIVE  NEGATIVE  mg/dL   Urobilinogen, UA 1.0  0.0 - 1.0 mg/dL   Nitrite NEGATIVE  NEGATIVE   Leukocytes, UA NEGATIVE  NEGATIVE   Comment: MICROSCOPIC NOT DONE ON URINES WITH NEGATIVE PROTEIN, BLOOD, LEUKOCYTES, NITRITE, OR GLUCOSE <1000 mg/dL.   No results found.  Review of Systems  Neurological: Negative for focal weakness.  All other systems reviewed and are negative.    Blood pressure 125/76, pulse 89, temperature 97.9 F (36.6 C), temperature source Oral, resp. rate 14, height 5\' 11"  (1.803 m), weight 103.9 kg (229 lb 0.9 oz), SpO2 96.00%. Physical Exam  Constitutional: He is oriented to person, place, and time. He appears well-developed and well-nourished.  HENT:  Head: Atraumatic.  Eyes: EOM are normal.  Cardiovascular: Intact distal pulses.   Respiratory: Effort normal.  Musculoskeletal:       Left shoulder: He exhibits decreased range of motion and pain.  Neurological: He is alert and oriented to person, place, and time.  Skin: Skin is warm and dry.  Psychiatric: He has a normal mood and affect.     Assessment/Plan L shoulder endstage arthritis Plan L total shoulder replacement Risks / benefits of surgery discussed Consent on chart  NPO for OR Preop antibiotics   Devin Noble 01/30/2013, 10:19 AM

## 2013-01-30 NOTE — Anesthesia Postprocedure Evaluation (Signed)
Anesthesia Post Note  Patient: Devin Noble  Procedure(s) Performed: Procedure(s) (LRB): TOTAL SHOULDER ARTHROPLASTY (Left)  Anesthesia type: General  Patient location: PACU  Post pain: Pain level controlled and Adequate analgesia  Post assessment: Post-op Vital signs reviewed, Patient's Cardiovascular Status Stable, Respiratory Function Stable, Patent Airway and Pain level controlled  Last Vitals:  Filed Vitals:   01/30/13 1415  BP: 123/77  Pulse: 106  Temp:   Resp: 18    Post vital signs: Reviewed and stable  Level of consciousness: awake, alert  and oriented  Complications: No apparent anesthesia complications

## 2013-01-30 NOTE — Preoperative (Signed)
Beta Blockers   Reason not to administer Beta Blockers:Not Applicable 

## 2013-01-30 NOTE — Transfer of Care (Signed)
Immediate Anesthesia Transfer of Care Note  Patient: Devin Noble  Procedure(s) Performed: Procedure(s): TOTAL SHOULDER ARTHROPLASTY (Left)  Patient Location: PACU  Anesthesia Type:General  Level of Consciousness: awake, alert , oriented and patient cooperative  Airway & Oxygen Therapy: Patient Spontanous Breathing and Patient connected to nasal cannula oxygen  Post-op Assessment: Report given to PACU RN, Post -op Vital signs reviewed and stable and Patient moving all extremities X 4  Post vital signs: Reviewed and stable  Complications: No apparent anesthesia complications

## 2013-01-30 NOTE — Progress Notes (Signed)
Pt noted to have voided in bed, changed to new, dry bed with assistance

## 2013-01-30 NOTE — Op Note (Signed)
Procedure(s): TOTAL SHOULDER ARTHROPLASTY Procedure Note  Devin Noble male 57 y.o. 01/30/2013  Procedure(s) and Anesthesia Type:    * LEFT TOTAL SHOULDER ARTHROPLASTY - Choice  Surgeon(s) and Role:    * Mable Paris, MD - Primary   Indications:  57 y.o. male  With endstage left shoulder arthritis. Pain and dysfunction interfered with quality of life and nonoperative treatment with activity modification, NSAIDS and injections failed.     Surgeon: Mable Paris   Assistants: Damita Lack PA-C [facility] was present and scrubbed throughout the procedure and was essential in positioning, retraction, exposure, and closure)  Anesthesia: General endotracheal anesthesia with preoperative interscalene block    Procedure Detail  Left TOTAL SHOULDER ARTHROPLASTY  Findings: DePuy size 12 stem porous coated with a 52 x 21 centered head and a 52 anchor peg glenoid. The glenoid was preferentially reamed anteriorly with a 3 mm elevated guide to correct the posterior wear. A lesser tuberosity osteotomy was performed.  Estimated Blood Loss:  300 mL         Drains: 1 medium hemovac  Blood Given: none          Specimens: none        Complications:  * No complications entered in OR log *         Disposition: PACU - hemodynamically stable.         Condition: stable    Procedure:   The patient was identified in the preoperative holding area where I personally marked the operative extremity after verifying with the patient and consent. He  was taken to the operating room where He was transferred to the   operative table.  The patient received an interscalene block in   the holding area by the attending anesthesiologist.  General anesthesia was induced   in the operating room without complication.  The patient did receive IV  Ancef prior to the commencement of the procedure.  The patient was   placed in the beach-chair position with the back raised about 30    degrees.  The nonoperative extremity and head and neck were carefully   positioned and padded protecting against neurovascular compromise.  The   left upper extremity was then prepped and draped in the standard sterile   fashion.    The appropriate operative time-out was performed with   Anesthesia, the perioperative staff, as well as myself and we all agreed   that the left side was the correct operative site.  An approximately   10 cm incision was made from the tip of the coracoid to the center point of the   humerus at the level of the axilla.  Dissection was carried down sharply   through subcutaneous tissues and cephalic vein was identified and taken   laterally with the deltoid.  The pectoralis major was taken medially.  The   upper 1 cm of the pectoralis major was released from its attachment on   the humerus.  The clavipectoral fascia was incised just lateral to the   conjoined tendon.  This incision was carried up to but not into the   coracoacromial ligament.  Digital palpation was used to prove   integrity of the axillary nerve which was protected throughout the   procedure.  Musculocutaneous nerve was not palpated in the operative   field.  Conjoined tendon was then retracted gently medially and the   deltoid laterally.  Anterior circumflex humeral vessels were clamped and   coagulated.  The  soft tissues overlying the biceps was incised and this   incision was carried across the transverse humeral ligament to the base   of the coracoid.  The biceps was tenodesed to the soft tissue just above   pectoralis major and the remaining portion of the biceps superiorly was   excised.  An osteotomy was performed at the lesser tuberosity and the   subscapularis was freed from the underlying capsule.  Capsule was then   released all the way down to the 6 o'clock position of the humeral head.   The humeral head was then delivered with simultaneous adduction,   extension and external  rotation.  All humeral osteophytes were removed   and the anatomic neck of the humerus was marked and cut free hand at   approximately 25 degrees retroversion within about 3 mm of the cuff   reflection posteriorly.  The head size was estimated to be a 52 medium   offset.  At that point, the humeral head was retracted posteriorly with   a Fukuda retractor and the anterior-inferior capsule was excised.   Remaining portion of the capsule was released at the base of the   coracoid.  The remaining biceps anchor and the entire anterior-inferior   labrum was excised.  The posterior labrum was also excised but the   posterior capsule was not released.  The guidepin was placed bicortically with +3 elevated guide.  The reamer was used to ream to concentric bone with punctate bleeding.  This gave an excellent concentric surface.  The center hole was then drilled for an anchor peg glenoid followed by the three peripheral holes and none of the holes   exited the glenoid wall.  I then pulse irrigated these holes and dried   them with Surgicel.  The three peripheral holes were then   pressurized cemented and the anchor peg glenoid was placed and impacted   with an excellent fit.  The glenoid was a 52 component.  The proximal humerus was then again exposed taking care not to displace the glenoid.    The humerus was then sequentially reamed going from 6 to 12 by 2 mm incriments. The 12 mm reamer was found to have appropriate cortical contact.  A   box osteotome was then used and a 12-mm broach.  The broach handle was   removed and the trial head was placed.   Calcar reamer was used.The centered 52 x 21 head fit best.  With the trial implantation of the component, there was   approximately 50% posterior translation with immediate snap back to the   anatomic position.  With forward elevation, there was no tendency   towards posterior subluxation.   The trial was removed and the final implant was prepared on a back  table.  The trial was removed and the final implant was prepared on a back table.   Small holes were drilled on both sides of the lesser tuberosity osteotomy, through which 3 #2 FiberWires were passed. The implant was then placed through the loop of all 3 #2 FiberWires and impacted with an excellent press-fit. This achieved excellent anatomic reconstruction of the proximal humerus.  The joint was then copiously irrigated with pulse lavage.  The subscapularis and   lesser tuberosity osteotomy were then repaired using the 3 #2 fiber wires previously passed.  It was noted that there was a small perforation of the subscapularis which was repaired with #1 Ethibond. One #1 Ethibond was placed at the  rotator interval just above   the lesser tuberosity. After repair of the lesser tuberosity, a medium   Hemovac was placed out anterolaterally and again copious irrigation was   used.   Skin was closed with 2-0 Vicryl sutures in the deep dermal layer and 4-0 Monocryl in a subcuticular  running fashion.  Sterile dressings were then applied including Steri- Strips, 4x4s, ABDs and tape.  The patient was placed in a sling and allowed to awaken from general anesthesia and taken to the recovery room in stable  condition.      POSTOPERATIVE PLAN:  Early passive range of motion will be allowed with the goal of 0 degrees external rotation and a 90 degrees forward elevation.  No internal rotation at this time.  No active motion of the arm until the lesser tuberosity heals.  The patient will likely be kept in the hospital for 1-2 days and then discharged home.

## 2013-01-31 LAB — CBC
HCT: 32.2 % — ABNORMAL LOW (ref 39.0–52.0)
Hemoglobin: 11.6 g/dL — ABNORMAL LOW (ref 13.0–17.0)
MCH: 31.4 pg (ref 26.0–34.0)
MCHC: 36 g/dL (ref 30.0–36.0)

## 2013-01-31 LAB — BASIC METABOLIC PANEL
BUN: 11 mg/dL (ref 6–23)
CO2: 25 mEq/L (ref 19–32)
Calcium: 8.9 mg/dL (ref 8.4–10.5)
GFR calc non Af Amer: >90 mL/min (ref >90)
Glucose, Bld: 124 mg/dL — ABNORMAL HIGH (ref 70–99)

## 2013-01-31 NOTE — Progress Notes (Signed)
PATIENT ID: Devin Noble   1 Day Post-Op Procedure(s) (LRB): TOTAL SHOULDER ARTHROPLASTY (Left)  Subjective: Doing well today. Pain is tolerable 3/10. Reports continued numbness in pointer and long finger. Did not sleep overnight. No other complaints or concerns.   Objective:  Filed Vitals:   01/31/13 0640  BP: 125/72  Pulse: 85  Temp: 98.6 F (37 C)  Resp: 18     AWake, alert orientated L UE dressing c/d/i Wiggles fingers, decreased sensation to light touch pointer and long finger Unable to flex at elbow/activate musmusculocutaneous n. Intact distal pulses Drain removed   Labs:   Recent Labs  01/30/13 0715 01/31/13 0500  HGB 14.6 11.6*   Recent Labs  01/30/13 0715 01/31/13 0500  WBC 3.6* 10.3  RBC 4.54 3.69*  HCT 39.5 32.2*  PLT 135* 139*   Recent Labs  01/30/13 0715 01/31/13 0500  NA 131* 130*  K 3.9 4.0  CL 95* 95*  CO2 23 25  BUN 6 11  CREATININE 0.75 0.92  GLUCOSE 97 124*  CALCIUM 9.6 8.9    Assessment and Plan: 1 day s/p left total shoulder replacement Will work with OT today Residual numbness of second and long finger with difficultly activating biceps, musculocutaneous n. Likely block related  Okay to d/c home today if pain well controlled and understands PT exercises limited to passive 90deg ff and 0 ER Scripts in chart   VTE proph: ASA 325mg  BID, scds

## 2013-01-31 NOTE — Evaluation (Signed)
Occupational Therapy Evaluation Patient Details Name: OVILA LEPAGE MRN: [id number] DOB: [birth year] Today's Date: 01/31/2013 Time: 7829-5621 OT Time Calculation (min): 41 min  OT Assessment / Plan / Recommendation Clinical Impression   57 y.o s/p Lt TSA. Reviewed all information with pt, and he feels comfortable with exercises and ADLs and did not feel need to practice anymore before d/c. Pt at supervision level and able to independently direct caregiver, and still had numbness during session.  Will have a friend staying with him for first two days. Feel pt is safe to d/c home.     OT Assessment  Patient does not need any further OT services    Follow Up Recommendations  No OT follow up;Supervision - Intermittent    Barriers to Discharge      Equipment Recommendations  None recommended by OT    Recommendations for Other Services    Frequency       Precautions / Restrictions Precautions Precautions: Shoulder Type of Shoulder Precautions: Dr. Ave Filter TSA Protocol- FF 90 degrees and ER 0 degrees in supine  Precaution Booklet Issued: Yes (comment) Required Braces or Orthoses: Other Brace/Splint Other Brace/Splint: shoulder sling Restrictions Weight Bearing Restrictions: Yes LUE Weight Bearing: Non weight bearing   Pertinent Vitals/Pain No pain reported.    ADL  Toilet Transfer: Modified independent Toilet Transfer Method: Sit to stand Toilet Transfer Equipment: Regular height toilet ADL Comments: Pt practiced simulated regular height toilet transfer at Mod I. Reviewed shoulder handout with pt and performed exercises and donning/doffing sling.    OT Diagnosis:    OT Problem List:   OT Treatment Interventions:     OT Goals    Visit Information  Last OT Received On: 01/31/13 Assistance Needed: +1    Subjective Data      Prior Functioning     Home Living Lives With: Alone Available Help at Discharge: Friend(s) (staying with him next two days during the  day) Bathroom Shower/Tub: Tub/shower unit;Curtain Firefighter: Standard Prior Function Level of Independence: Independent Communication Communication: No difficulties Dominant Hand: Right         Vision/Perception     Cognition       Extremity/Trunk Assessment Right Upper Extremity Assessment RUE ROM/Strength/Tone: WFL for tasks assessed Left Upper Extremity Assessment LUE ROM/Strength/Tone: Deficits;Due to precautions LUE ROM/Strength/Tone Deficits: TSA LUE Sensation: Deficits LUE Sensation Deficits: still reports numbness in arm and first three digits     Mobility Bed Mobility Bed Mobility: Supine to Sit;Sit to Supine;Sitting - Scoot to Edge of Bed Supine to Sit: 5: Supervision;HOB flat Sitting - Scoot to Edge of Bed: 6: Modified independent (Device/Increase time) Sit to Supine: 5: Supervision;HOB flat Details for Bed Mobility Assistance: Supervision for vc's not to weight bear through LUE.  Transfers Transfers: Sit to Stand;Stand to Sit Sit to Stand: 6: Modified independent (Device/Increase time);From bed;From toilet Stand to Sit: 6: Modified independent (Device/Increase time);To bed;To toilet     Exercise Shoulder Exercises Shoulder Flexion: PROM;AAROM;Left;10 reps;Supine Shoulder External Rotation: PROM;AAROM;Left;10 reps;Supine Elbow Flexion: PROM;Left;10 reps;Standing (pt unable to lift elbow due to numbness) Elbow Extension: PROM;Left;10 reps;Standing Wrist Flexion: AROM;Left;10 reps;Standing Wrist Extension: AROM;Left;10 reps;Standing Digit Composite Flexion: AROM;10 reps;Standing Composite Extension: AROM;10 reps;Standing Neck Flexion: AROM;10 reps;Standing Neck Extension: AROM;10 reps;Standing Neck Lateral Flexion - Right: AROM;10 reps;Standing Neck Lateral Flexion - Left: AROM;10 reps;Standing Donning/doffing shirt without moving shoulder: Supervision/safety;Patient able to independently direct caregiver Method for sponge bathing under operated  UE: Supervision/safety;Patient able to independently direct caregiver Donning/doffing sling/immobilizer: Patient able  to independently direct caregiver;Supervision/safety Correct positioning of sling/immobilizer: Patient able to independently direct caregiver;Supervision/safety ROM for elbow, wrist and digits of operated UE: Minimal assistance (unable to perform elbow flexion due to numbness) Sling wearing schedule (on at all times/off for ADL's): Supervision/safety;Patient able to independently direct caregiver Proper positioning of operated UE when showering: Supervision/safety;Patient able to independently direct caregiver Positioning of UE while sleeping: Supervision/safety;Patient able to independently direct caregiver   Balance     End of Session OT - End of Session Equipment Utilized During Treatment: Gait belt;Other (comment) (shoulder sling) Activity Tolerance: Patient tolerated treatment well Patient left: in bed;with call bell/phone within reach Nurse Communication: Mobility status  GO     Earlie Raveling  OTR/L [phone]  01/31/2013, 10:04 AM

## 2013-02-01 DIAGNOSIS — M19012 Primary osteoarthritis, left shoulder: Secondary | ICD-10-CM | POA: Diagnosis present

## 2013-02-01 NOTE — Discharge Summary (Signed)
Patient ID: Devin Noble MRN: [id number] DOB/AGE: [birth year] 56 y.o.  Admit date: 01/30/2013 Discharge date: 02/01/2013  Admission Diagnoses:  Principal Problem:   Arthritis of shoulder region, left   Discharge Diagnoses:  Same  Past Medical History  Diagnosis Date  . Anxiety     DUE TO SURGERY  . Hemorrhoid   . Hypertension     takes Lisinopril/HCTZ daily  . Arthritis     takes Mobic prn  . GERD (gastroesophageal reflux disease)     doesn't take any meds  . Birthmark of skin     "right eye" (01/30/2013)    Surgeries: Procedure(s): TOTAL SHOULDER ARTHROPLASTY on 01/30/2013   Consultants:    Discharged Condition: Improved  Hospital Course: Devin Noble is an 57 y.o. male who was admitted 01/30/2013 for operative treatment ofArthritis of shoulder region, left. Patient has severe unremitting pain that affects sleep, daily activities, and work/hobbies. After pre-op clearance the patient was taken to the operating room on 01/30/2013 and underwent  Procedure(s): TOTAL SHOULDER ARTHROPLASTY.    Patient was given perioperative antibiotics: Anti-infectives   Start     Dose/Rate Route Frequency Ordered Stop   01/30/13 1830  ceFAZolin (ANCEF) IVPB 2 g/50 mL premix     2 g 100 mL/hr over 30 Minutes Intravenous Every 6 hours 01/30/13 1703 01/31/13 0619   01/30/13 0600  ceFAZolin (ANCEF) IVPB 2 g/50 mL premix     2 g 100 mL/hr over 30 Minutes Intravenous On call to O.R. 01/29/13 1424 01/30/13 1051       Patient was given sequential compression devices, early ambulation, and ASA325mg  BID to prevent DVT.  Patient benefited maximally from hospital stay and there were no complications.    Recent vital signs: No data found.    Recent laboratory studies:  Recent Labs  01/30/13 0715 01/31/13 0500  WBC 3.6* 10.3  HGB 14.6 11.6*  HCT 39.5 32.2*  PLT 135* 139*  NA 131* 130*  K 3.9 4.0  CL 95* 95*  CO2 23 25  BUN 6 11  CREATININE 0.75 0.92  GLUCOSE 97 124*  INR 1.08  --    CALCIUM 9.6 8.9     Discharge Medications:     Medication List    STOP taking these medications       meloxicam 7.5 MG tablet  Commonly known as:  MOBIC      TAKE these medications       lisinopril-hydrochlorothiazide 20-25 MG per tablet  Commonly known as:  PRINZIDE,ZESTORETIC  Take 1 tablet by mouth daily.     oxyCODONE-acetaminophen 5-325 MG per tablet  Commonly known as:  ROXICET  Take 1-2 tablets by mouth every 4 (four) hours as needed for pain.        Diagnostic Studies: Dg Chest 2 View  01/16/2013  *RADIOLOGY REPORT*  Clinical Data: Preoperative respiratory examination for shoulder surgery.  CHEST - 2 VIEW  Comparison: None  Findings: The cardiomediastinal silhouette is unremarkable. The lungs are clear. There is no evidence of focal airspace disease, pulmonary edema, suspicious pulmonary nodule/mass, pleural effusion, or pneumothorax. No acute bony abnormalities are identified. A remote appearing 75% lower thoracic vertebral compression fracture is noted.  IMPRESSION: No evidence of acute abnormality.  Remote appearing lower thoracic vertebral compression fracture.   Original Report Authenticated By: Harmon Pier, M.D.    Dg Shoulder Left Port  01/30/2013  *RADIOLOGY REPORT*  Clinical Data: Postop left total shoulder arthroplasty.  PORTABLE LEFT SHOULDER - 2+ VIEW  Comparison: CT 12/28/2012.  Findings: Single AP view obtained portably at 1444 hours demonstrates interval left total shoulder arthroplasty.  The humeral prosthesis appears well positioned.  A surgical drain is in place.  There is no evidence of acute fracture or dislocation on this single view.  IMPRESSION: No demonstrated complication following left total shoulder arthroplasty.   Original Report Authenticated By: Carey Bullocks, M.D.     Disposition: 01-Home or Self Care      Discharge Orders   Future Orders Complete By Expires     Call MD / Call 911  As directed     Comments:      If you experience chest  pain or shortness of breath, CALL 911 and be transported to the hospital emergency room.  If you develope a fever above 101 F, pus (white drainage) or increased drainage or redness at the wound, or calf pain, call your surgeon's office.    Constipation Prevention  As directed     Comments:      Drink plenty of fluids.  Prune juice may be helpful.  You may use a stool softener, such as Colace (over the counter) 100 mg twice a day.  Use MiraLax (over the counter) for constipation as needed.    Diet - low sodium heart healthy  As directed     Driving restrictions  As directed     Comments:      No driving until cleared by physician.    Increase activity slowly as tolerated  As directed     Lifting restrictions  As directed     Comments:      No lifting until cleared by physician.       Follow-up Information   Follow up with Mable Paris, MD. Schedule an appointment as soon as possible for a visit in 2 weeks.   Contact information:   [address] [place] [address] [phone]        Signed: Jiles Harold 02/01/2013, 9:06 AM

## 2013-02-02 ENCOUNTER — Encounter (HOSPITAL_COMMUNITY): Payer: Self-pay | Admitting: Orthopedic Surgery

## 2013-04-25 ENCOUNTER — Other Ambulatory Visit: Payer: Self-pay | Admitting: Orthopedic Surgery

## 2013-05-15 ENCOUNTER — Encounter (HOSPITAL_COMMUNITY): Payer: Self-pay | Admitting: Pharmacy Technician

## 2013-05-16 ENCOUNTER — Encounter (HOSPITAL_COMMUNITY)
Admission: RE | Admit: 2013-05-16 | Discharge: 2013-05-16 | Disposition: A | Discharge status: Home / Self Care | Payer: BC Managed Care – PPO | Source: Ambulatory Visit | Attending: Orthopedic Surgery | Admitting: Orthopedic Surgery

## 2013-05-16 ENCOUNTER — Encounter (HOSPITAL_COMMUNITY): Payer: Self-pay

## 2013-05-16 DIAGNOSIS — K219 Gastro-esophageal reflux disease without esophagitis: Secondary | ICD-10-CM | POA: Insufficient documentation

## 2013-05-16 DIAGNOSIS — E669 Obesity, unspecified: Secondary | ICD-10-CM | POA: Insufficient documentation

## 2013-05-16 DIAGNOSIS — I10 Essential (primary) hypertension: Secondary | ICD-10-CM | POA: Insufficient documentation

## 2013-05-16 DIAGNOSIS — Z01812 Encounter for preprocedural laboratory examination: Secondary | ICD-10-CM | POA: Insufficient documentation

## 2013-05-16 LAB — CBC WITH DIFFERENTIAL/PLATELET
Eosinophils Absolute: 0.1 10*3/uL (ref 0.0–0.7)
Eosinophils Relative: 2 % (ref 0–5)
HCT: 40.7 % (ref 39.0–52.0)
Hemoglobin: 14.5 g/dL (ref 13.0–17.0)
Lymphocytes Relative: 17 % (ref 12–46)
Lymphs Abs: 0.7 10*3/uL (ref 0.7–4.0)
MCH: 30.5 pg (ref 26.0–34.0)
MCV: 85.7 fL (ref 78.0–100.0)
Monocytes Absolute: 0.6 10*3/uL (ref 0.1–1.0)
Monocytes Relative: 13 % — ABNORMAL HIGH (ref 3–12)
Platelets: 134 10*3/uL — ABNORMAL LOW (ref 150–400)
RBC: 4.75 MIL/uL (ref 4.22–5.81)
WBC: 4.2 10*3/uL (ref 4.0–10.5)

## 2013-05-16 LAB — URINALYSIS, ROUTINE W REFLEX MICROSCOPIC
Bilirubin Urine: NEGATIVE
Glucose, UA: 250 mg/dL — AB
Hgb urine dipstick: NEGATIVE
Protein, ur: NEGATIVE mg/dL
Specific Gravity, Urine: 1.016 (ref 1.005–1.030)

## 2013-05-16 LAB — COMPREHENSIVE METABOLIC PANEL
ALT: 55 U/L — ABNORMAL HIGH (ref 0–53)
BUN: 3 mg/dL — ABNORMAL LOW (ref 6–23)
CO2: 25 mEq/L (ref 19–32)
Calcium: 9.6 mg/dL (ref 8.4–10.5)
GFR calc Af Amer: >90 mL/min (ref >90)
GFR calc non Af Amer: >90 mL/min (ref >90)
Glucose, Bld: 222 mg/dL — ABNORMAL HIGH (ref 70–99)
Sodium: 135 mEq/L (ref 135–145)
Total Protein: 7.7 g/dL (ref 6.0–8.3)

## 2013-05-16 LAB — URINE MICROSCOPIC-ADD ON

## 2013-05-16 LAB — TYPE AND SCREEN: ABO/RH(D): B POS

## 2013-05-16 LAB — PROTIME-INR: Prothrombin Time: 16.6 seconds — ABNORMAL HIGH (ref 11.6–15.2)

## 2013-05-16 NOTE — Progress Notes (Signed)
05/16/13 1100  OBSTRUCTIVE SLEEP APNEA  Have you ever been diagnosed with sleep apnea through a sleep study? No  Do you snore loudly (loud enough to be heard through closed doors)?  1  Do you often feel tired, fatigued, or sleepy during the daytime? 0  Has anyone observed you stop breathing during your sleep? 1  Do you have, or are you being treated for high blood pressure? 1  BMI more than 35 kg/m2? 0  Age over 56 years old? 1  Neck circumference greater than 40 cm/18 inches? 0  Gender: 1  Obstructive Sleep Apnea Score 5  Score 4 or greater  Results sent to PCP

## 2013-05-16 NOTE — Pre-Procedure Instructions (Signed)
Devin Noble  05/16/2013   Your procedure is scheduled on:  August 28  Report to [facility] at 05:30 AM.  Call this number if you have problems the morning of surgery: [phone]   Remember:   Do not eat food or drink liquids after midnight.   Take these medicines the morning of surgery with A SIP OF WATER: None   Do not take Aspirin, Aleve, Naproxen, Advil, Ibuprofen, Vitamin, Herbs, or Supplements starting today 8/20  Do not wear jewelry, make-up or nail polish.  Do not wear lotions, powders, or perfumes. You may wear deodorant.  Do not shave 48 hours prior to surgery. Men may shave face and neck.  Do not bring valuables to the hospital.  [facility] is not responsible                   for any belongings or valuables.  Contacts, dentures or bridgework may not be worn into surgery.  Leave suitcase in the car. After surgery it may be brought to your room.  For patients admitted to the hospital, checkout time is 11:00 AM the day of  discharge.   Special Instructions: Shower using CHG 2 nights before surgery and the night before surgery.  If you shower the day of surgery use CHG.  Use special wash - you have one bottle of CHG for all showers.  You should use approximately 1/3 of the bottle for each shower.   Please read over the following fact sheets that you were given: Pain Booklet, Coughing and Deep Breathing, Blood Transfusion Information and Surgical Site Infection Prevention

## 2013-05-16 NOTE — Progress Notes (Signed)
Spoke with Matt at Dr. Veda Canning office who will send note regarding temp, UA and CMET results

## 2013-05-17 NOTE — Progress Notes (Addendum)
Anesthesia Chart Review:  Patient is a 57 year old male scheduled for right total shoulder replacement on 05/24/13 by Dr .Ave Filter.  History includes obesity, non-smoker, HTN, GERD, left total shoulder on 01/30/13.  PCP is listed as Dr. Windle Guard.  EKG on 01/16/13 showed NSR.  CXR on 01/16/13 showed: No evidence of acute abnormality. Remote appearing lower thoracic vertebral compression fracture.  Preoperative labs noted.  Non-fasting glucose 222 (no history of diabetes),  AST 112/ALT 55.  Total bilirubin is 1.4.  He does drink ETOH.  No documented history of hepatitis. Patient apparently had a low grade temp during their PAT appointment. ( Abnormal labs and temp already called to Dr. Veda Canning office by the PAT RN yesterday.)  I did not see patient at his PAT appointment.  I spoke with Albin Felling at Dr. Veda Canning office today.  I have recommended that they get input from Dr. Hennie Duos prior to surgery since patient has had a further increase in his AST and bilirubin, low grade fever, and newly elevated glucose > 200.  Will follow-up input from Dr. Hennie Duos office once available.  For now, will order a CBG and HFP on the day of surgery.  Velna Ochs [facility]/Anesthesiology Phone [phone] 05/17/2013 4:50 PM  Addendum: 05/23/2013 3:57 PM Patient was seen by Dr. Jeannetta Nap today for a preoperative clearance.  He reviewed labs done at PAT.  Glucose was 93 today when repeated.  CXR and EKG were also repeated at his office.  Records faxed where light in color and difficult to read, but I did confirm with Dr. Hennie Duos office that note does state "clear for surgery pending ortho eval eschar."  Patient is seeing Dr. Ave Filter today to evaluate the right arm eschar to ensure he does not think the eschar will interfere with surgery from his standpoint.  Dr. Hennie Duos plans to follow-up LFTs in one month.

## 2013-05-23 MED ORDER — CEFAZOLIN SODIUM-DEXTROSE 2-3 GM-% IV SOLR
2.0000 g | INTRAVENOUS | Status: AC
  Administered 2013-05-24: 2 g via INTRAVENOUS
  Filled 2013-05-23: qty 50

## 2013-05-24 ENCOUNTER — Encounter (HOSPITAL_COMMUNITY): Payer: Self-pay | Admitting: Vascular Surgery

## 2013-05-24 ENCOUNTER — Encounter (HOSPITAL_COMMUNITY): Payer: Self-pay | Admitting: *Deleted

## 2013-05-24 ENCOUNTER — Ambulatory Visit (HOSPITAL_COMMUNITY): Payer: BC Managed Care – PPO

## 2013-05-24 ENCOUNTER — Encounter (HOSPITAL_COMMUNITY)
Admission: RE | Disposition: A | Discharge status: Home / Self Care | Payer: Self-pay | Source: Ambulatory Visit | Attending: Orthopedic Surgery

## 2013-05-24 ENCOUNTER — Ambulatory Visit (HOSPITAL_COMMUNITY): Payer: BC Managed Care – PPO | Admitting: Anesthesiology

## 2013-05-24 ENCOUNTER — Inpatient Hospital Stay (HOSPITAL_COMMUNITY)
Admission: RE | Admit: 2013-05-24 | Discharge: 2013-05-25 | DRG: 491 | Disposition: A | Discharge status: Home / Self Care | Payer: BC Managed Care – PPO | Source: Ambulatory Visit | Attending: Orthopedic Surgery | Admitting: Orthopedic Surgery

## 2013-05-24 DIAGNOSIS — M19011 Primary osteoarthritis, right shoulder: Secondary | ICD-10-CM | POA: Diagnosis present

## 2013-05-24 DIAGNOSIS — I10 Essential (primary) hypertension: Secondary | ICD-10-CM | POA: Diagnosis present

## 2013-05-24 DIAGNOSIS — Z96619 Presence of unspecified artificial shoulder joint: Secondary | ICD-10-CM

## 2013-05-24 DIAGNOSIS — Z885 Allergy status to narcotic agent status: Secondary | ICD-10-CM

## 2013-05-24 DIAGNOSIS — K219 Gastro-esophageal reflux disease without esophagitis: Secondary | ICD-10-CM | POA: Diagnosis present

## 2013-05-24 DIAGNOSIS — M19019 Primary osteoarthritis, unspecified shoulder: Principal | ICD-10-CM | POA: Diagnosis present

## 2013-05-24 DIAGNOSIS — R7309 Other abnormal glucose: Secondary | ICD-10-CM | POA: Diagnosis present

## 2013-05-24 HISTORY — PX: TOTAL SHOULDER ARTHROPLASTY: SHX126

## 2013-05-24 LAB — GLUCOSE, CAPILLARY: Glucose-Capillary: 129 mg/dL — ABNORMAL HIGH (ref 70–99)

## 2013-05-24 SURGERY — ARTHROPLASTY, SHOULDER, TOTAL
Anesthesia: Regional | Site: Shoulder | Laterality: Right | Wound class: Clean

## 2013-05-24 MED ORDER — BISACODYL 5 MG PO TBEC: 5.0000 mg | DELAYED_RELEASE_TABLET | Freq: Every day | ORAL | Status: DC | PRN

## 2013-05-24 MED ORDER — OXYCODONE-ACETAMINOPHEN 5-325 MG PO TABS
1.0000 | ORAL_TABLET | ORAL | Status: DC | PRN
  Administered 2013-05-24 – 2013-05-25 (×3): 2 via ORAL
  Filled 2013-05-24 (×3): qty 2

## 2013-05-24 MED ORDER — BUPIVACAINE-EPINEPHRINE PF 0.5-1:200000 % IJ SOLN
INTRAMUSCULAR | Status: DC | PRN
  Administered 2013-05-24: 30 mL

## 2013-05-24 MED ORDER — ACETAMINOPHEN 650 MG RE SUPP: 650.0000 mg | Freq: Four times a day (QID) | RECTAL | Status: DC | PRN

## 2013-05-24 MED ORDER — ONDANSETRON HCL 4 MG/2ML IJ SOLN
INTRAMUSCULAR | Status: DC | PRN
  Administered 2013-05-24: 4 mg via INTRAVENOUS

## 2013-05-24 MED ORDER — POVIDONE-IODINE 7.5 % EX SOLN
Freq: Once | CUTANEOUS | Status: DC
Start: 2013-05-24 — End: 2013-05-24

## 2013-05-24 MED ORDER — ROCURONIUM BROMIDE 100 MG/10ML IV SOLN
INTRAVENOUS | Status: DC | PRN
  Administered 2013-05-24: 10 mg via INTRAVENOUS
  Administered 2013-05-24: 20 mg via INTRAVENOUS
  Administered 2013-05-24: 50 mg via INTRAVENOUS

## 2013-05-24 MED ORDER — OXYCODONE HCL 5 MG/5ML PO SOLN: 5.0000 mg | Freq: Once | ORAL | Status: DC | PRN

## 2013-05-24 MED ORDER — ASPIRIN EC 325 MG PO TBEC
325.0000 mg | DELAYED_RELEASE_TABLET | Freq: Two times a day (BID) | ORAL | Status: DC
  Administered 2013-05-24 – 2013-05-25 (×2): 325 mg via ORAL
  Filled 2013-05-24 (×4): qty 1

## 2013-05-24 MED ORDER — DIPHENHYDRAMINE HCL 12.5 MG/5ML PO ELIX: 12.5000 mg | ORAL_SOLUTION | ORAL | Status: DC | PRN

## 2013-05-24 MED ORDER — OXYCODONE-ACETAMINOPHEN 5-325 MG PO TABS: 1.0000 | ORAL_TABLET | ORAL | Status: DC | PRN

## 2013-05-24 MED ORDER — MIDAZOLAM HCL 5 MG/5ML IJ SOLN
INTRAMUSCULAR | Status: DC | PRN
  Administered 2013-05-24: 2 mg via INTRAVENOUS

## 2013-05-24 MED ORDER — METOCLOPRAMIDE HCL 10 MG PO TABS: 5.0000 mg | ORAL_TABLET | Freq: Three times a day (TID) | ORAL | Status: DC | PRN

## 2013-05-24 MED ORDER — LISINOPRIL-HYDROCHLOROTHIAZIDE 20-25 MG PO TABS: 1.0000 | ORAL_TABLET | Freq: Every day | ORAL | Status: DC

## 2013-05-24 MED ORDER — SODIUM CHLORIDE 0.9 % IV SOLN
INTRAVENOUS | Status: DC
  Administered 2013-05-24: 15:00:00 via INTRAVENOUS

## 2013-05-24 MED ORDER — CEFAZOLIN SODIUM-DEXTROSE 2-3 GM-% IV SOLR
2.0000 g | Freq: Four times a day (QID) | INTRAVENOUS | Status: AC
  Administered 2013-05-24 – 2013-05-25 (×3): 2 g via INTRAVENOUS
  Filled 2013-05-24 (×3): qty 50

## 2013-05-24 MED ORDER — ALUMINUM HYDROXIDE GEL 320 MG/5ML PO SUSP
15.0000 mL | ORAL | Status: DC | PRN
  Filled 2013-05-24: qty 30

## 2013-05-24 MED ORDER — MENTHOL 3 MG MT LOZG: 1.0000 | LOZENGE | OROMUCOSAL | Status: DC | PRN

## 2013-05-24 MED ORDER — ONDANSETRON HCL 4 MG/2ML IJ SOLN: 4.0000 mg | Freq: Four times a day (QID) | INTRAMUSCULAR | Status: DC | PRN

## 2013-05-24 MED ORDER — MORPHINE SULFATE 2 MG/ML IJ SOLN: 1.0000 mg | INTRAMUSCULAR | Status: DC | PRN

## 2013-05-24 MED ORDER — LISINOPRIL 20 MG PO TABS
20.0000 mg | ORAL_TABLET | Freq: Every day | ORAL | Status: DC
  Administered 2013-05-24: 20 mg via ORAL
  Filled 2013-05-24 (×2): qty 1

## 2013-05-24 MED ORDER — FENTANYL CITRATE 0.05 MG/ML IJ SOLN
50.0000 ug | INTRAMUSCULAR | Status: DC | PRN
  Administered 2013-05-24: 100 ug via INTRAVENOUS
  Filled 2013-05-24: qty 2

## 2013-05-24 MED ORDER — NEOSTIGMINE METHYLSULFATE 1 MG/ML IJ SOLN
INTRAMUSCULAR | Status: DC | PRN
  Administered 2013-05-24: 3 mg via INTRAVENOUS

## 2013-05-24 MED ORDER — OXYCODONE HCL 5 MG PO TABS
5.0000 mg | ORAL_TABLET | ORAL | Status: DC | PRN
  Administered 2013-05-25: 10 mg via ORAL
  Filled 2013-05-24: qty 2

## 2013-05-24 MED ORDER — OXYCODONE HCL 5 MG PO TABS: 5.0000 mg | ORAL_TABLET | Freq: Once | ORAL | Status: DC | PRN

## 2013-05-24 MED ORDER — DOCUSATE SODIUM 100 MG PO CAPS
100.0000 mg | ORAL_CAPSULE | Freq: Two times a day (BID) | ORAL | Status: DC
  Administered 2013-05-24 (×2): 100 mg via ORAL
  Filled 2013-05-24 (×3): qty 1

## 2013-05-24 MED ORDER — PHENOL 1.4 % MT LIQD: 1.0000 | OROMUCOSAL | Status: DC | PRN

## 2013-05-24 MED ORDER — HYDROCHLOROTHIAZIDE 25 MG PO TABS
25.0000 mg | ORAL_TABLET | Freq: Every day | ORAL | Status: DC
  Administered 2013-05-24: 25 mg via ORAL
  Filled 2013-05-24 (×2): qty 1

## 2013-05-24 MED ORDER — LACTATED RINGERS IV SOLN
INTRAVENOUS | Status: DC | PRN
  Administered 2013-05-24 (×3): via INTRAVENOUS

## 2013-05-24 MED ORDER — HYDROMORPHONE HCL PF 1 MG/ML IJ SOLN: 0.2500 mg | INTRAMUSCULAR | Status: DC | PRN

## 2013-05-24 MED ORDER — FLEET ENEMA 7-19 GM/118ML RE ENEM: 1.0000 | ENEMA | Freq: Once | RECTAL | Status: AC | PRN

## 2013-05-24 MED ORDER — GLYCOPYRROLATE 0.2 MG/ML IJ SOLN
INTRAMUSCULAR | Status: DC | PRN
  Administered 2013-05-24: 0.2 mg via INTRAVENOUS
  Administered 2013-05-24: 0.4 mg via INTRAVENOUS

## 2013-05-24 MED ORDER — ACETAMINOPHEN 325 MG PO TABS: 650.0000 mg | ORAL_TABLET | Freq: Four times a day (QID) | ORAL | Status: DC | PRN

## 2013-05-24 MED ORDER — ONDANSETRON HCL 4 MG PO TABS
4.0000 mg | ORAL_TABLET | Freq: Four times a day (QID) | ORAL | Status: DC | PRN
  Administered 2013-05-24: 4 mg via ORAL
  Filled 2013-05-24: qty 1

## 2013-05-24 MED ORDER — MIDAZOLAM HCL 2 MG/2ML IJ SOLN
1.0000 mg | INTRAMUSCULAR | Status: DC | PRN
  Administered 2013-05-24: 2 mg via INTRAVENOUS
  Filled 2013-05-24: qty 2

## 2013-05-24 MED ORDER — SODIUM CHLORIDE 0.9 % IR SOLN
Status: DC | PRN
  Administered 2013-05-24: 1000 mL

## 2013-05-24 MED ORDER — LIDOCAINE HCL (CARDIAC) 20 MG/ML IV SOLN
INTRAVENOUS | Status: DC | PRN
  Administered 2013-05-24: 70 mg via INTRAVENOUS

## 2013-05-24 MED ORDER — POLYETHYLENE GLYCOL 3350 17 G PO PACK: 17.0000 g | PACK | Freq: Every day | ORAL | Status: DC | PRN

## 2013-05-24 MED ORDER — PHENYLEPHRINE HCL 10 MG/ML IJ SOLN
INTRAMUSCULAR | Status: DC | PRN
  Administered 2013-05-24 (×6): 80 ug via INTRAVENOUS
  Administered 2013-05-24: 40 ug via INTRAVENOUS
  Administered 2013-05-24: 80 ug via INTRAVENOUS

## 2013-05-24 MED ORDER — PROPOFOL 10 MG/ML IV BOLUS
INTRAVENOUS | Status: DC | PRN
  Administered 2013-05-24: 200 mg via INTRAVENOUS

## 2013-05-24 MED ORDER — METOCLOPRAMIDE HCL 5 MG/ML IJ SOLN: 5.0000 mg | Freq: Three times a day (TID) | INTRAMUSCULAR | Status: DC | PRN

## 2013-05-24 MED ORDER — EPHEDRINE SULFATE 50 MG/ML IJ SOLN
INTRAMUSCULAR | Status: DC | PRN
  Administered 2013-05-24 (×2): 10 mg via INTRAVENOUS

## 2013-05-24 MED ORDER — HYDROCODONE-ACETAMINOPHEN 5-325 MG PO TABS: 1.0000 | ORAL_TABLET | ORAL | Status: DC | PRN

## 2013-05-24 MED ORDER — FENTANYL CITRATE 0.05 MG/ML IJ SOLN
INTRAMUSCULAR | Status: DC | PRN
  Administered 2013-05-24 (×2): 100 ug via INTRAVENOUS
  Administered 2013-05-24 (×2): 50 ug via INTRAVENOUS

## 2013-05-24 MED ORDER — ZOLPIDEM TARTRATE 5 MG PO TABS: 5.0000 mg | ORAL_TABLET | Freq: Every evening | ORAL | Status: DC | PRN

## 2013-05-24 SURGICAL SUPPLY — 73 items
ASSEMBLY NECK TAPER FIXED 135 (Orthopedic Implant) ×2 IMPLANT
BIT DRILL 5/64X5 DISP (BIT) IMPLANT
BLADE SAW SAG 73X25 THK (BLADE) ×1
BLADE SAW SGTL 73X25 THK (BLADE) ×1 IMPLANT
BLADE SURG 15 STRL LF DISP TIS (BLADE) ×1 IMPLANT
BLADE SURG 15 STRL SS (BLADE) ×1
BOWL SMART MIX CTS (DISPOSABLE) IMPLANT
CEMENT BONE DEPUY (Cement) ×2 IMPLANT
CHLORAPREP W/TINT 26ML (MISCELLANEOUS) ×4 IMPLANT
CLOSURE STERI-STRIP 1/4X4 (GAUZE/BANDAGES/DRESSINGS) ×2 IMPLANT
CLOTH BEACON ORANGE TIMEOUT ST (SAFETY) ×2 IMPLANT
COVER SURGICAL LIGHT HANDLE (MISCELLANEOUS) ×2 IMPLANT
DRAPE INCISE IOBAN 66X45 STRL (DRAPES) ×4 IMPLANT
DRAPE SURG 17X23 STRL (DRAPES) ×2 IMPLANT
DRAPE U-SHAPE 47X51 STRL (DRAPES) ×2 IMPLANT
DRSG ADAPTIC 3X8 NADH LF (GAUZE/BANDAGES/DRESSINGS) IMPLANT
DRSG MEPILEX BORDER 4X12 (GAUZE/BANDAGES/DRESSINGS) ×2 IMPLANT
DRSG MEPILEX BORDER 4X8 (GAUZE/BANDAGES/DRESSINGS) ×2 IMPLANT
DRSG PAD ABDOMINAL 8X10 ST (GAUZE/BANDAGES/DRESSINGS) IMPLANT
ELECT BLADE 4.0 EZ CLEAN MEGAD (MISCELLANEOUS)
ELECT REM PT RETURN 9FT ADLT (ELECTROSURGICAL) ×2
ELECTRODE BLDE 4.0 EZ CLN MEGD (MISCELLANEOUS) IMPLANT
ELECTRODE REM PT RTRN 9FT ADLT (ELECTROSURGICAL) ×1 IMPLANT
EVACUATOR 1/8 PVC DRAIN (DRAIN) ×2 IMPLANT
GLENOID ANCHOR PEG CROSSLK 52 (Orthopedic Implant) ×2 IMPLANT
GLOVE BIO SURGEON STRL SZ7 (GLOVE) ×2 IMPLANT
GLOVE BIO SURGEON STRL SZ7.5 (GLOVE) ×2 IMPLANT
GLOVE BIOGEL PI IND STRL 7.0 (GLOVE) ×1 IMPLANT
GLOVE BIOGEL PI IND STRL 8 (GLOVE) ×1 IMPLANT
GLOVE BIOGEL PI INDICATOR 7.0 (GLOVE) ×1
GLOVE BIOGEL PI INDICATOR 8 (GLOVE) ×1
GOWN PREVENTION PLUS LG XLONG (DISPOSABLE) ×2 IMPLANT
GOWN STRL REIN XL XLG (GOWN DISPOSABLE) ×2 IMPLANT
HANDPIECE INTERPULSE COAX TIP (DISPOSABLE) ×1
HEAD ECCENTRIC HUMERAL SZ52X21 (Head) ×2 IMPLANT
HEMOSTAT SURGICEL 2X14 (HEMOSTASIS) IMPLANT
HOOD PEEL AWAY FACE SHEILD DIS (HOOD) ×4 IMPLANT
KIT BASIN OR (CUSTOM PROCEDURE TRAY) ×2 IMPLANT
KIT ROOM TURNOVER OR (KITS) ×2 IMPLANT
MANIFOLD NEPTUNE II (INSTRUMENTS) ×2 IMPLANT
NEEDLE HYPO 25GX1X1/2 BEV (NEEDLE) IMPLANT
NEEDLE MAYO TROCAR (NEEDLE) ×2 IMPLANT
NS IRRIG 1000ML POUR BTL (IV SOLUTION) ×2 IMPLANT
PACK SHOULDER (CUSTOM PROCEDURE TRAY) ×2 IMPLANT
PAD ARMBOARD 7.5X6 YLW CONV (MISCELLANEOUS) ×4 IMPLANT
RETRIEVER SUT HEWSON (MISCELLANEOUS) ×2 IMPLANT
SET HNDPC FAN SPRY TIP SCT (DISPOSABLE) ×1 IMPLANT
SLING ARM IMMOBILIZER LRG (SOFTGOODS) ×2 IMPLANT
SLING ARM IMMOBILIZER MED (SOFTGOODS) IMPLANT
SMARTMIX MINI TOWER (MISCELLANEOUS) ×2
SPONGE GAUZE 4X4 12PLY (GAUZE/BANDAGES/DRESSINGS) ×2 IMPLANT
SPONGE LAP 18X18 X RAY DECT (DISPOSABLE) ×2 IMPLANT
SPONGE LAP 4X18 X RAY DECT (DISPOSABLE) ×6 IMPLANT
STEM GLOBAL AP 12MM (Stem) ×2 IMPLANT
STRIP CLOSURE SKIN 1/2X4 (GAUZE/BANDAGES/DRESSINGS) ×2 IMPLANT
SUCTION FRAZIER TIP 10 FR DISP (SUCTIONS) ×2 IMPLANT
SUPPORT WRAP ARM LG (MISCELLANEOUS) ×2 IMPLANT
SUT ETHIBOND 2 OS 4 DA (SUTURE) IMPLANT
SUT ETHIBOND NAB CT1 #1 30IN (SUTURE) ×2 IMPLANT
SUT FIBERWIRE #2 38 T-5 BLUE (SUTURE) ×6
SUT MNCRL AB 4-0 PS2 18 (SUTURE) ×2 IMPLANT
SUT SILK 2 0 TIES 17X18 (SUTURE)
SUT SILK 2-0 18XBRD TIE BLK (SUTURE) IMPLANT
SUT VIC AB 0 CTB1 27 (SUTURE) IMPLANT
SUT VIC AB 2-0 CT1 27 (SUTURE) ×1
SUT VIC AB 2-0 CT1 TAPERPNT 27 (SUTURE) ×1 IMPLANT
SUTURE FIBERWR #2 38 T-5 BLUE (SUTURE) ×3 IMPLANT
SYR CONTROL 10ML LL (SYRINGE) IMPLANT
TOWEL OR 17X24 6PK STRL BLUE (TOWEL DISPOSABLE) ×2 IMPLANT
TOWEL OR 17X26 10 PK STRL BLUE (TOWEL DISPOSABLE) ×2 IMPLANT
TOWER SMARTMIX MINI (MISCELLANEOUS) ×1 IMPLANT
TRAY FOLEY CATH 16FRSI W/METER (SET/KITS/TRAYS/PACK) IMPLANT
WATER STERILE IRR 1000ML POUR (IV SOLUTION) ×2 IMPLANT

## 2013-05-24 NOTE — Anesthesia Postprocedure Evaluation (Signed)
Anesthesia Post Note  Patient: Devin Noble  Procedure(s) Performed: Procedure(s) (LRB): RIGHT TOTAL SHOULDER ARTHROPLASTY (Right)  Anesthesia type: General  Patient location: PACU  Post pain: Pain level controlled and Adequate analgesia  Post assessment: Post-op Vital signs reviewed, Patient's Cardiovascular Status Stable, Respiratory Function Stable, Patent Airway and Pain level controlled  Last Vitals:  Filed Vitals:   05/24/13 1200  BP: 122/77  Pulse: 95  Temp:   Resp: 13    Post vital signs: Reviewed and stable  Level of consciousness: awake, alert  and oriented  Complications: No apparent anesthesia complications

## 2013-05-24 NOTE — Transfer of Care (Signed)
Immediate Anesthesia Transfer of Care Note  Patient: Devin Noble  Procedure(s) Performed: Procedure(s) with comments: RIGHT TOTAL SHOULDER ARTHROPLASTY (Right) - Right total shoulder replacement  Patient Location: PACU  Anesthesia Type:General  Level of Consciousness: awake and alert   Airway & Oxygen Therapy: Patient Spontanous Breathing and Patient connected to nasal cannula oxygen  Post-op Assessment: Report given to PACU RN and Post -op Vital signs reviewed and stable  Post vital signs: Reviewed and stable  Complications: No apparent anesthesia complications

## 2013-05-24 NOTE — Anesthesia Preprocedure Evaluation (Signed)
Anesthesia Evaluation  Patient identified by MRN, date of birth, ID band Patient awake    Reviewed: Allergy & Precautions, H&P , NPO status , Patient's Chart, lab work & pertinent test results  Airway Mallampati: II  Neck ROM: full    Dental   Pulmonary          Cardiovascular hypertension,     Neuro/Psych    GI/Hepatic GERD-  ,  Endo/Other    Renal/GU      Musculoskeletal  (+) Arthritis -,   Abdominal   Peds  Hematology   Anesthesia Other Findings   Reproductive/Obstetrics                           Anesthesia Physical Anesthesia Plan  ASA: II  Anesthesia Plan: General and Regional   Post-op Pain Management: MAC Combined w/ Regional for Post-op pain   Induction: Intravenous  Airway Management Planned: Oral ETT  Additional Equipment:   Intra-op Plan:   Post-operative Plan: Extubation in OR  Informed Consent: I have reviewed the patients History and Physical, chart, labs and discussed the procedure including the risks, benefits and alternatives for the proposed anesthesia with the patient or authorized representative who has indicated his/her understanding and acceptance.     Plan Discussed with: CRNA, Anesthesiologist and Surgeon  Anesthesia Plan Comments:         Anesthesia Quick Evaluation

## 2013-05-24 NOTE — Anesthesia Procedure Notes (Signed)
Anesthesia Regional Block:  Interscalene brachial plexus block  Pre-Anesthetic Checklist: ,, timeout performed, Correct Patient, Correct Site, Correct Laterality, Correct Procedure, Correct Position, site marked, Risks and benefits discussed,  Surgical consent,  Pre-op evaluation,  At surgeon's request and post-op pain management  Laterality: Right  Prep: chloraprep       Needles:  Injection technique: Single-shot  Needle Type: Echogenic Stimulator Needle     Needle Length: 5cm 5 cm Needle Gauge: 22 and 22 G    Additional Needles:  Procedures: ultrasound guided (picture in chart) and nerve stimulator Interscalene brachial plexus block  Nerve Stimulator or Paresthesia:  Response: biceps flexion, 0.45 mA,   Additional Responses:   Narrative:  Start time: 05/24/2013 7:10 AM End time: 05/24/2013 7:22 AM Injection made incrementally with aspirations every 5 mL.  Performed by: Personally  Anesthesiologist: Dr Chaney Malling  Additional Notes: Functioning IV was confirmed and monitors were applied.  A 50mm 22ga Arrow echogenic stimulator needle was used. Sterile prep and drape,hand hygiene and sterile gloves were used.  Negative aspiration and negative test dose prior to incremental administration of local anesthetic. The patient tolerated the procedure well.  Ultrasound guidance: relevent anatomy identified, needle position confirmed, local anesthetic spread visualized around nerve(s), vascular puncture avoided.  Image printed for medical record.   Interscalene brachial plexus block

## 2013-05-24 NOTE — H&P (Signed)
Devin Noble is an 57 y.o. male.   Chief Complaint: R shoulder pain and dysfunction HPI: R shoulder arthritis, bone on bone, failed conservative treatment.  Recently had L total shoulder and did very well with that.  Pain interferes with sleep, work and quality of life.   Past Medical History  Diagnosis Date  . Hypertension     takes Lisinopril/HCTZ daily  . Birthmark of skin     "right eye" (01/30/2013)  . GERD (gastroesophageal reflux disease)     seldom  . Arthritis     Past Surgical History  Procedure Laterality Date  . 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 Colonoscopy  ~ 2009  . Total shoulder arthroplasty Left 01/30/2013    Procedure: TOTAL SHOULDER ARTHROPLASTY;  Surgeon: Mable Paris, MD;  Location: [facility] OR;  Service: Orthopedics;  Laterality: Left;    History reviewed. No pertinent family history. Social History:  reports that he has never smoked. He has never used smokeless tobacco. He reports that he drinks about 14.4 ounces of alcohol per week. He reports that he does not use illicit drugs.  Allergies:  Allergies  Allergen Reactions  . Tramadol Hives    Medications Prior to Admission  Medication Sig Dispense Refill  . lisinopril-hydrochlorothiazide (PRINZIDE,ZESTORETIC) 20-25 MG per tablet Take 1 tablet by mouth daily.        Results for orders placed during the hospital encounter of 05/24/13 (from the past 48 hour(s))  GLUCOSE, CAPILLARY     Status: Abnormal   Collection Time    05/24/13  6:18 AM      Result Value Range   Glucose-Capillary 129 (*) 70 - 99 mg/dL   No results found.  Review of Systems  All other systems reviewed and are negative.    There were no vitals taken for this visit. Physical Exam  Constitutional: He is oriented to person, place, and time. He appears well-developed and well-nourished.  HENT:  Head: Atraumatic.  Eyes: EOM are normal.  Cardiovascular: Intact distal pulses.   Respiratory: Effort normal.   Musculoskeletal:       Right shoulder: He exhibits decreased range of motion, crepitus and pain.  Neurological: He is alert and oriented to person, place, and time.  Skin: Skin is warm and dry.  Psychiatric: He has a normal mood and affect.     Assessment/Plan R endstage glenohumeral arthritis Plan R shoulder TSA Risks / benefits of surgery discussed Consent on chart  NPO for OR Preop antibiotics   Larell Baney WILLIAM 05/24/2013, 7:19 AM

## 2013-05-24 NOTE — Preoperative (Signed)
Beta Blockers   Reason not to administer Beta Blockers:Not Applicable 

## 2013-05-24 NOTE — Op Note (Signed)
Procedure(s): RIGHT TOTAL SHOULDER ARTHROPLASTY Procedure Note  STEPHFON BOVEY male 57 y.o. 05/24/2013  Procedure(s) and Anesthesia Type:    * RIGHT TOTAL SHOULDER ARTHROPLASTY - Choice  Surgeon(s) and Role:    * Mable Paris, MD - Primary   Indications:  57 y.o. male  With endstage right shoulder arthritis. Pain and dysfunction interfered with quality of life and nonoperative treatment with activity modification, NSAIDS and injections failed.     Surgeon: Mable Paris   Assistants: Damita Lack PA-C [facility] was present and scrubbed throughout the procedure and was essential in positioning, retraction, exposure, and closure)  Anesthesia: General endotracheal anesthesia with preoperative interscalene block     Procedure Detail  RIGHT TOTAL SHOULDER ARTHROPLASTY  Findings: DePuy size 12 proximally porous coated press-fit stem with a 52 x 21 standard head, 52 anchor peg glenoid cemented. A lesser tuberosity osteotomy was performed and repaired at the end of the case. Excellent stability was noted. Excellent bone quality was noted.  Estimated Blood Loss:  200 mL         Drains: 1 medium hemovac  Blood Given: none          Specimens: none        Complications:  * No complications entered in OR log *         Disposition: PACU - hemodynamically stable.         Condition: stable    Procedure:   The patient was identified in the preoperative holding area where I personally marked the operative extremity after verifying with the patient and consent. He  was taken to the operating room where He was transferred to the   operative table.  The patient received an interscalene block in   the holding area by the attending anesthesiologist.  General anesthesia was induced   in the operating room without complication.  The patient did receive IV  Ancef prior to the commencement of the procedure.  The patient was   placed in the beach-chair position  with the back raised about 30   degrees.  The nonoperative extremity and head and neck were carefully   positioned and padded protecting against neurovascular compromise.  The   left upper extremity was then prepped and draped in the standard sterile   fashion.    The appropriate operative time-out was performed with   Anesthesia, the perioperative staff, as well as myself and we all agreed   that the right side was the correct operative site.  An approximately   10 cm incision was made from the tip of the coracoid to the center point of the   humerus at the level of the axilla.  Dissection was carried down sharply   through subcutaneous tissues and cephalic vein was identified and taken   laterally with the deltoid.  The pectoralis major was taken medially.  The   upper 1 cm of the pectoralis major was released from its attachment on   the humerus.  The clavipectoral fascia was incised just lateral to the   conjoined tendon.  This incision was carried up to but not into the   coracoacromial ligament.  Digital palpation was used to prove   integrity of the axillary nerve which was protected throughout the   procedure.  Musculocutaneous nerve was not palpated in the operative   field.  Conjoined tendon was then retracted gently medially and the   deltoid laterally.  Anterior circumflex humeral vessels were clamped and  coagulated.  The soft tissues overlying the biceps was incised and this   incision was carried across the transverse humeral ligament to the base   of the coracoid.  The biceps was tenodesed to the soft tissue just above   pectoralis major and the remaining portion of the biceps superiorly was   excised.  An osteotomy was performed at the lesser tuberosity and the   subscapularis was freed from the underlying capsule.  Capsule was then   released all the way down to the 6 o'clock position of the humeral head.   The humeral head was then delivered with simultaneous adduction,    extension and external rotation.  All humeral osteophytes were removed   and the anatomic neck of the humerus was marked and cut free hand at   approximately 25 degrees retroversion within about 3 mm of the cuff   reflection posteriorly.  The head size was estimated to be a 52 medium   offset.  At that point, the humeral head was retracted posteriorly with   a Fukuda retractor and the anterior-inferior capsule was excised.   Remaining portion of the capsule was released at the base of the   coracoid.  The remaining biceps anchor and the entire anterior-inferior   labrum was excised.  The posterior labrum was also excised but the   posterior capsule was not released.  The guidepin was placed bicortically with +3 elevated guide.  The reamer was used to ream to concentric bone with punctate bleeding.  This gave an excellent concentric surface.  The center hole was then drilled for an anchor peg glenoid followed by the three peripheral holes and none of the holes   exited the glenoid wall.  I then pulse irrigated these holes and dried   them with Surgicel.  The three peripheral holes were then   pressurized cemented and the anchor peg glenoid was placed and impacted   with an excellent fit.  The glenoid was a 52 component.  The proximal humerus was then again exposed taking care not to displace the glenoid.    The humerus was then sequentially reamed going from 6 to 12 by 2 mm incriments. The 12 mm reamer was found to have appropriate cortical contact.  A   box osteotome was then used and a 12-mm broach.  The broach handle was   removed and the trial head was placed.   Calcar reamer was used.The standard 52 x 21 head fit best.  With the trial implantation of the component, there was   approximately 50% posterior translation with immediate snap back to the   anatomic position.  With forward elevation, there was no tendency   towards posterior subluxation.   The trial was removed and the final  implant was prepared on a back table.  The trial was removed and the final implant was prepared on a back table.   Small holes were drilled on both sides of the lesser tuberosity osteotomy, through which 3 #2 FiberWires were passed. The implant was then placed through the loop of all 3 #2 FiberWires and impacted with an excellent press-fit. This achieved excellent anatomic reconstruction of the proximal humerus.  The joint was then copiously irrigated with pulse lavage.  The subscapularis and   lesser tuberosity osteotomy were then repaired using the 3 #2 fiber wires previously passed.   One #1 Ethibond was placed at the rotator interval just above   the lesser tuberosity. After repair of the lesser  tuberosity, a medium   Hemovac was placed out anterolaterally and again copious irrigation was   used.   Skin was closed with 2-0 Vicryl sutures in the deep dermal layer and 4-0 Monocryl in a subcuticular  running fashion.  Sterile dressings were then applied including Steri- Strips, 4x4s, ABDs and tape.  The patient was placed in a sling and allowed to awaken from general anesthesia and taken to the recovery room in stable  condition.      POSTOPERATIVE PLAN:  Early passive range of motion will be allowed with the goal of 40 degrees external rotation and 140 degrees forward elevation.  No internal rotation at this time.  No active motion of the arm until the lesser tuberosity heals.  The patient will likely be kept in the hospital for 1-2 days and then discharged home.

## 2013-05-24 NOTE — Progress Notes (Signed)
Utilization Review Completed.Arth Nicastro T8/28/2014  

## 2013-05-25 ENCOUNTER — Encounter (HOSPITAL_COMMUNITY): Payer: Self-pay | Admitting: Orthopedic Surgery

## 2013-05-25 DIAGNOSIS — M19011 Primary osteoarthritis, right shoulder: Secondary | ICD-10-CM | POA: Diagnosis present

## 2013-05-25 LAB — BASIC METABOLIC PANEL
BUN: 6 mg/dL (ref 6–23)
CO2: 25 mEq/L (ref 19–32)
Chloride: 96 mEq/L (ref 96–112)
Creatinine, Ser: 0.74 mg/dL (ref 0.50–1.35)
Glucose, Bld: 117 mg/dL — ABNORMAL HIGH (ref 70–99)

## 2013-05-25 LAB — CBC
HCT: 34 % — ABNORMAL LOW (ref 39.0–52.0)
Hemoglobin: 11.5 g/dL — ABNORMAL LOW (ref 13.0–17.0)
MCV: 88.5 fL (ref 78.0–100.0)
RBC: 3.84 MIL/uL — ABNORMAL LOW (ref 4.22–5.81)
WBC: 7.1 10*3/uL (ref 4.0–10.5)

## 2013-05-25 NOTE — Discharge Summary (Signed)
Patient ID: Devin Noble MRN: [id number] DOB/AGE: August 06, 1956 57 y.o.  Admit date: 06/09/2013 Discharge date: 05/25/2013  Admission Diagnoses:  Principal Problem:   Arthritis of shoulder region, right   Discharge Diagnoses:  Same  Past Medical History  Diagnosis Date  . Hypertension     takes Lisinopril/HCTZ daily  . Birthmark of skin     "right eye" (01/30/2013)  . GERD (gastroesophageal reflux disease)     seldom  . Arthritis     Surgeries: Procedure(s): RIGHT TOTAL SHOULDER ARTHROPLASTY on 06/09/2013   Consultants:    Discharged Condition: Improved  Hospital Course: Devin Noble is an 57 y.o. male who was admitted 06/09/2013 for operative treatment ofArthritis of shoulder region, right. Patient has severe unremitting pain that affects sleep, daily activities, and work/hobbies. After pre-op clearance the patient was taken to the operating room on Jun 09, 2013 and underwent  Procedure(s): RIGHT TOTAL SHOULDER ARTHROPLASTY.    Patient was given perioperative antibiotics: Anti-infectives   Start     Dose/Rate Route Frequency Ordered Stop   09-Jun-2013 1515  ceFAZolin (ANCEF) IVPB 2 g/50 mL premix     2 g 100 mL/hr over 30 Minutes Intravenous Every 6 hours 06/09/13 1430 05/25/13 0502   Jun 09, 2013 0600  ceFAZolin (ANCEF) IVPB 2 g/50 mL premix     2 g 100 mL/hr over 30 Minutes Intravenous On call to O.R. 05/23/13 1435 Jun 09, 2013 0738       Patient was given sequential compression devices, early ambulation, and ASA 325mg  BID to prevent DVT.  Patient benefited maximally from hospital stay and there were no complications.    Recent vital signs: Patient Vitals for the past 24 hrs:  BP Temp Pulse Resp SpO2  05/25/13 0502 119/81 mmHg 99 F (37.2 C) 92 18 96 %  05/25/13 0143 113/66 mmHg 98.9 F (37.2 C) 96 18 95 %  06/09/2013 1432 143/82 mmHg 98.6 F (37 C) 104 18 96 %  06/09/2013 1411 130/87 mmHg - 109 17 95 %  2013-06-09 1410 - 98.2 F (36.8 C) - - -  June 09, 2013 1329 124/85 mmHg -  100 17 96 %  06-09-13 1314 124/85 mmHg - 96 13 95 %  2013-06-09 1310 - 97.8 F (36.6 C) - - -  2013-06-09 1259 124/85 mmHg - 94 15 95 %  09-Jun-2013 1244 133/88 mmHg - 111 14 96 %     Recent laboratory studies:  Recent Labs  05/25/13 0620  WBC 7.1  HGB 11.5*  HCT 34.0*  PLT 118*  NA 134*  K 3.1*  CL 96  CO2 25  BUN 6  CREATININE 0.74  GLUCOSE 117*  CALCIUM 8.3*     Discharge Medications:     Medication List         lisinopril-hydrochlorothiazide 20-25 MG per tablet  Commonly known as:  PRINZIDE,ZESTORETIC  Take 1 tablet by mouth daily.     oxyCODONE-acetaminophen 5-325 MG per tablet  Commonly known as:  ROXICET  Take 1-2 tablets by mouth every 4 (four) hours as needed for pain.        Diagnostic Studies: Dg Shoulder Right Port  06/09/13   *RADIOLOGY REPORT*  Clinical Data: Post right shoulder arthroplasty  PORTABLE RIGHT SHOULDER - 2+ VIEW  Comparison: 01/16/2013  Findings: Single portable view of the right shoulder submitted. There is a proximal right humeral prosthesis in anatomic alignment in right glenohumeral joint.  Postsurgical changes are noted with a surgical drain.  IMPRESSION: Right shoulder prosthesis in anatomic alignment.  Postsurgical  changes are noted.   Original Report Authenticated By: Natasha Mead, M.D.    Disposition: 01-Home or Self Care      Discharge Orders   Future Orders Complete By Expires   Call MD / Call 911  As directed    Comments:     If you experience chest pain or shortness of breath, CALL 911 and be transported to the hospital emergency room.  If you develope a fever above 101 F, pus (white drainage) or increased drainage or redness at the wound, or calf pain, call your surgeon's office.   Constipation Prevention  As directed    Comments:     Drink plenty of fluids.  Prune juice may be helpful.  You may use a stool softener, such as Colace (over the counter) 100 mg twice a day.  Use MiraLax (over the counter) for constipation as  needed.   Diet - low sodium heart healthy  As directed    Driving restrictions  As directed    Comments:     No driving while in sling or taking narcotic pain medications.   Increase activity slowly as tolerated  As directed    Lifting restrictions  As directed    Comments:     No lifting until cleared by physician.      Follow-up Information   Follow up with Mable Paris, MD. Schedule an appointment as soon as possible for a visit in 2 weeks.   Specialty:  Orthopedic Surgery   Contact information:   [address] [place] [address] [phone]        Signed: Jiles Noble 05/25/2013, 12:41 PM

## 2013-05-25 NOTE — Evaluation (Signed)
Occupational Therapy Evaluation and Discharge Summary Patient Details Name: Devin Noble MRN: [id number] DOB: [birth year] Today's Date: 05/25/2013 Time: 4098-1191 OT Time Calculation (min): 29 min  OT Assessment / Plan / Recommendation History of present illness Pt admitted for R total shoulder arthorplasty.  Pt just had L shoulder done recently so is very familiar with routine.   Clinical Impression   Pt seen for above diagnosis.  All education complete and pt ready for d/c.    OT Assessment  Progress rehab of shoulder as ordered by MD at follow-up appointment    Follow Up Recommendations  No OT follow up    Barriers to Discharge      Equipment Recommendations  None recommended by OT    Recommendations for Other Services    Frequency       Precautions / Restrictions Precautions Precautions: Shoulder Type of Shoulder Precautions: ER limited to 40 degrees, FF limited to 140.  Shoulder Interventions: Shoulder sling/immobilizer;Off for dressing/bathing/exercises Precaution Booklet Issued: Yes (comment) Precaution Comments: Pt is very active with this shoulder.  Reviewed with him several times to wear sling except when bathing/exercising and not to over do it. Required Braces or Orthoses: Sling   Pertinent Vitals/Pain Pt c/o 6/10 pain in R shoulder.    ADL  Eating/Feeding: Performed;Independent Where Assessed - Eating/Feeding: Chair Grooming: Performed;Independent Where Assessed - Grooming: Unsupported standing Upper Body Bathing: Performed;Independent Where Assessed - Upper Body Bathing: Unsupported sitting Lower Body Bathing: Performed;Independent Where Assessed - Lower Body Bathing: Unsupported sit to stand Upper Body Dressing: Performed;Independent Where Assessed - Upper Body Dressing: Unsupported sitting Lower Body Dressing: Performed;Independent Where Assessed - Lower Body Dressing: Unsupported sit to stand Toilet Transfer: Performed;Independent Toilet Transfer  Method: Sit to Barista: Regular height toilet Toileting - Clothing Manipulation and Hygiene: Performed;Independent Where Assessed - Toileting Clothing Manipulation and Hygiene: Standing Transfers/Ambulation Related to ADLs: Pt I with all ambulation ADL Comments: Pt does well with all adls.  All of the techniques are very familiar due to recent L shoulder surgery.    OT Diagnosis:    OT Problem List:   OT Treatment Interventions:     OT Goals(Current goals can be found in the care plan section) Acute Rehab OT Goals Patient Stated Goal: to get out of here  Visit Information  Last OT Received On: 05/25/13 Assistance Needed: +1 History of Present Illness: Pt admitted for R total shoulder arthorplasty.  Pt just had L shoulder done recently so is very familiar with routine.       Prior Functioning     Home Living Family/patient expects to be discharged to:: Private residence Living Arrangements: Alone Available Help at Discharge: Friend(s) Type of Home: House Home Layout: One level Home Equipment: None Prior Function Level of Independence: Independent Communication Communication: No difficulties Dominant Hand: Right         Vision/Perception     Cognition  Cognition Arousal/Alertness: Awake/alert Behavior During Therapy: WFL for tasks assessed/performed Overall Cognitive Status: Within Functional Limits for tasks assessed    Extremity/Trunk Assessment Upper Extremity Assessment Upper Extremity Assessment: RUE deficits/detail RUE Deficits / Details: Pt limited to 80 degrees of FF and 20 degrees of ER RUE: Unable to fully assess due to pain;Unable to fully assess due to immobilization RUE Coordination: decreased gross motor Lower Extremity Assessment Lower Extremity Assessment: Overall WFL for tasks assessed Cervical / Trunk Assessment Cervical / Trunk Assessment: Normal     Mobility Bed Mobility Bed Mobility: Supine to Sit Supine  to  Sit: 7: Independent Transfers Transfers: Sit to Stand;Stand to Sit Sit to Stand: 7: Independent Stand to Sit: 7: Independent Details for Transfer Assistance: no assist.l     Exercise Shoulder Exercises Shoulder Flexion: AAROM;10 reps;Right;Supine;Limitations (limited to 140.  went to 80) Shoulder External Rotation: AAROM;10 reps;Right;Supine (w dowel.) Elbow Flexion: AROM;10 reps;Right;Seated Elbow Extension: AROM;10 reps;Right;Seated Wrist Flexion: AROM;Right;10 reps;Seated Wrist Extension: AROM;Right;10 reps;Seated Digit Composite Flexion: AROM;Right;10 reps;Seated Composite Extension: AROM;10 reps;Right;Standing Donning/doffing shirt without moving shoulder: Independent Method for sponge bathing under operated UE: Independent Donning/doffing sling/immobilizer: Independent Correct positioning of sling/immobilizer: Independent Pendulum exercises (written home exercise program): Independent ROM for elbow, wrist and digits of operated UE: Independent Sling wearing schedule (on at all times/off for ADL's): Independent Proper positioning of operated UE when showering: Independent Dressing change: Independent Positioning of UE while sleeping: Independent   Balance Balance Balance Assessed: Yes Dynamic Standing Balance Dynamic Standing - Balance Support: No upper extremity supported Dynamic Standing - Level of Assistance: 7: Independent   End of Session OT - End of Session Activity Tolerance: Patient tolerated treatment well Patient left: in chair;with call bell/phone within reach;with nursing/sitter in room Nurse Communication: Other (comment) (ready for d/c)  GO     Devin Noble 05/25/2013, 10:08 AM [phone]

## 2013-05-25 NOTE — Progress Notes (Signed)
PATIENT ID: Devin Noble   1 Day Post-Op Procedure(s) (LRB): RIGHT TOTAL SHOULDER ARTHROPLASTY (Right)  Subjective: Patient is doing well today. 6/10 soreness in right shoulder, due to another dose of percocet, tolerating pain well with oral pain rx. Ready to go home today  Objective:  Filed Vitals:   05/25/13 0502  BP: 119/81  Pulse: 92  Temp: 99 F (37.2 C)  Resp: 18     Awake, alert, orientated R UE dressing with small dried blood, no surrounding erythema, warmth. Moderate swelling  Drain removed today  Wiggles fingers, distally nvi   Labs:  No results found for this basename: HGB,  in the last 72 hoursNo results found for this basename: WBC, RBC, HCT, PLT,  in the last 72 hoursNo results found for this basename: NA, K, CL, CO2, BUN, CREATININE, GLUCOSE, CALCIUM,  in the last 72 hours  Assessment and Plan: 1 day s/p total shoulder replacement Will work with PT today on PROM in FF limited to 140 deg, ER to 40 deg Pain well controlled with po rx Can d/c home today Will follow up with Dr. Ave Filter in two weeks Percocet for home pain control, script in chart  VTE proph: SCDs, ASA 325mg  BID

## 2013-05-25 NOTE — Plan of Care (Signed)
Problem: Consults Goal: Diagnosis- Total Joint Replacement Outcome: Completed/Met Date Met:  05/25/13 Hemiarthroplasty  Dx  Right Total Shoulder Arthroplasty

## 2014-10-10 ENCOUNTER — Encounter (HOSPITAL_COMMUNITY): Payer: Self-pay | Admitting: Orthopedic Surgery

## 2014-12-13 ENCOUNTER — Ambulatory Visit (INDEPENDENT_AMBULATORY_CARE_PROVIDER_SITE_OTHER): Payer: Self-pay | Admitting: Internal Medicine

## 2014-12-13 ENCOUNTER — Other Ambulatory Visit: Payer: Self-pay | Admitting: Physician Assistant

## 2014-12-13 ENCOUNTER — Ambulatory Visit (INDEPENDENT_AMBULATORY_CARE_PROVIDER_SITE_OTHER): Payer: Self-pay

## 2014-12-13 VITALS — BP 160/86 | HR 106 | Temp 97.9°F | Resp 20 | Ht 70.5 in | Wt 232.6 lb

## 2014-12-13 DIAGNOSIS — F1099 Alcohol use, unspecified with unspecified alcohol-induced disorder: Secondary | ICD-10-CM

## 2014-12-13 DIAGNOSIS — R1901 Right upper quadrant abdominal swelling, mass and lump: Secondary | ICD-10-CM

## 2014-12-13 DIAGNOSIS — R1902 Left upper quadrant abdominal swelling, mass and lump: Secondary | ICD-10-CM

## 2014-12-13 DIAGNOSIS — K7011 Alcoholic hepatitis with ascites: Secondary | ICD-10-CM

## 2014-12-13 DIAGNOSIS — R7401 Elevation of levels of liver transaminase levels: Secondary | ICD-10-CM

## 2014-12-13 DIAGNOSIS — R740 Nonspecific elevation of levels of transaminase and lactic acid dehydrogenase [LDH]: Secondary | ICD-10-CM

## 2014-12-13 DIAGNOSIS — Z789 Other specified health status: Secondary | ICD-10-CM

## 2014-12-13 DIAGNOSIS — D696 Thrombocytopenia, unspecified: Secondary | ICD-10-CM

## 2014-12-13 LAB — COMPREHENSIVE METABOLIC PANEL
ALT: 38 U/L (ref 0–53)
AST: 88 U/L — ABNORMAL HIGH (ref 0–37)
Albumin: 3.5 g/dL (ref 3.5–5.2)
Alkaline Phosphatase: 105 U/L (ref 39–117)
BILIRUBIN TOTAL: 2.7 mg/dL — AB (ref 0.2–1.2)
BUN: 5 mg/dL — AB (ref 6–23)
CALCIUM: 8.7 mg/dL (ref 8.4–10.5)
CHLORIDE: 105 meq/L (ref 96–112)
CO2: 22 meq/L (ref 19–32)
CREATININE: 0.72 mg/dL (ref 0.50–1.35)
GLUCOSE: 90 mg/dL (ref 70–99)
Potassium: 3.9 mEq/L (ref 3.5–5.3)
Sodium: 139 mEq/L (ref 135–145)
Total Protein: 7.7 g/dL (ref 6.0–8.3)

## 2014-12-13 LAB — POCT CBC
GRANULOCYTE PERCENT: 68 % (ref 37–80)
HCT, POC: 33.8 % — AB (ref 43.5–53.7)
HEMOGLOBIN: 10.5 g/dL — AB (ref 14.1–18.1)
LYMPH, POC: 0.6 (ref 0.6–3.4)
MCH, POC: 26.7 pg — AB (ref 27–31.2)
MCHC: 31.1 g/dL — AB (ref 31.8–35.4)
MCV: 86 fL (ref 80–97)
MID (cbc): 0.5 (ref 0–0.9)
MPV: 6.8 fL (ref 0–99.8)
POC GRANULOCYTE: 2.2 (ref 2–6.9)
POC LYMPH PERCENT: 18.2 %L (ref 10–50)
POC MID %: 13.8 % — AB (ref 0–12)
Platelet Count, POC: 106 10*3/uL — AB (ref 142–424)
RBC: 3.93 M/uL — AB (ref 4.69–6.13)
RDW, POC: 17.5 %
WBC: 3.3 10*3/uL — AB (ref 4.6–10.2)

## 2014-12-13 LAB — VITAMIN B12: VITAMIN B 12: 672 pg/mL (ref 211–911)

## 2014-12-13 LAB — RETICULOCYTES
ABS RETIC: 69.8 10*3/uL (ref 19.0–186.0)
RBC.: 3.88 MIL/uL — ABNORMAL LOW (ref 4.22–5.81)
Retic Ct Pct: 1.8 % (ref 0.4–2.3)

## 2014-12-13 LAB — LIPASE: LIPASE: 29 U/L (ref 0–75)

## 2014-12-13 LAB — FOLATE: FOLATE: 8.7 ng/mL

## 2014-12-13 MED ORDER — CHLORDIAZEPOXIDE HCL 25 MG PO CAPS: ORAL_CAPSULE | ORAL | Status: AC

## 2014-12-13 NOTE — Progress Notes (Signed)
12/13/2014 at 2:48 PM  Devin Noble / DOB: 23-Dec-1955 / MRN: [id number]  The patient has Arthritis of shoulder region, left and Arthritis of shoulder region, right on his problem list.  SUBJECTIVE  Chief compalaint: Hernia   History of present illness: Devin Noble is 59 y.o. well appearing male presenting for a focal abdominal mass that started about 6 months ago.  He reports it started hurting about three weeks ago. The pain sharp and severe at times, and he felt as if he may need to call 911 roughly 3 nights ago 2/2 to the pain.  He thinks his blood pressure going up makes it worse, and does not know of anything that makes it better. He has tried Aleve and [employer] without relief of his symptoms.  He denies nausea, emesis, GERD like symptoms, and his bowels are moving normally.     He has not taken his BP medicine today.  He drinks 7 to 8 beers nightly. He also complains of generalized abdominal swelling that has been occuring over the last to to three months.  He denies orthopnea, SOB, and DOE.     He  has a past medical history of Hypertension; Birthmark of skin; GERD (gastroesophageal reflux disease); and Arthritis.    He has a current medication list which includes the following prescription(s): lisinopril-hydrochlorothiazide.  Devin Noble is allergic to tramadol. He  reports that he has never smoked. He has never used smokeless tobacco. He reports that he drinks about 14.4 oz of alcohol per week. He reports that he does not use illicit drugs. He  reports that he does not currently engage in sexual activity. 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  His family history is not on file.  ROS  Per HPI  OBJECTIVE  His  height is 5' 10.5" (1.791 m) and  weight is 232 lb 9.6 oz (105.507 kg). His oral temperature is 97.9 F (36.6 C). His blood pressure is 160/86 and his pulse is 106. His respiration is 20 and oxygen saturation is 98%.  The patient's body mass index is 32.89 kg/(m^2).  Physical Exam  Constitutional: He is oriented to person, place, and time.  Cardiovascular: Normal rate, regular rhythm, normal heart sounds and intact distal pulses.   Respiratory: Effort normal and breath sounds normal.  GI: Soft. Bowel sounds are normal. He exhibits distension. He exhibits no mass. There is no tenderness. There is no rigidity, no rebound, no guarding, no tenderness at McBurney's point and negative Murphy's sign.    Neurological: He is alert and oriented to person, place, and time.  Skin: Skin is warm and dry.  Psychiatric: He has a normal mood and affect. His behavior is normal. Thought content normal.   [facility] reading (PRIMARY) by  Dr. Perrin Maltese: Ground glass appearance throughout abdomen, otherwise unremarkable.     Results for orders placed or performed in visit on 12/13/14 (from the past 24 hour(s))  Comprehensive metabolic panel     Status: Abnormal   Collection Time: 12/13/14  9:16 AM  Result Value Ref Range   Sodium 139 135 - 145 mEq/L   Potassium 3.9 3.5 - 5.3 mEq/L   Chloride 105 96 - 112 mEq/L   CO2 22 19 - 32 mEq/L   Glucose, Bld 90 70 - 99 mg/dL   BUN 5 (L) 6 -  23 mg/dL   Creat 0.450.72 4.090.50 - 8.111.35 mg/dL   Total Bilirubin 2.7 (H) 0.2 - 1.2 mg/dL   Alkaline Phosphatase 105 39 - 117 U/L   AST 88 (H) 0 - 37 U/L   ALT 38 0 - 53 U/L   Total Protein 7.7 6.0 - 8.3 g/dL   Albumin 3.5 3.5 - 5.2 g/dL   Calcium 8.7 8.4 - 91.410.5 mg/dL   Narrative   Performed at:  [facility]                [address], [address]                [place], [place] [address]  Lipase     Status: None   Collection Time: 12/13/14  9:16 AM  Result Value Ref Range   Lipase 29 0 - 75 U/L   Narrative   Performed at:  [facility]                 [address], [address]                [place], [place] [address]  POCT CBC     Status: Abnormal   Collection Time: 12/13/14  9:21 AM  Result Value Ref Range   WBC 3.3 (A) 4.6 - 10.2 K/uL   Lymph, poc 0.6 0.6 - 3.4   POC LYMPH PERCENT 18.2 10 - 50 %L   MID (cbc) 0.5 0 - 0.9   POC MID % 13.8 (A) 0 - 12 %M   POC Granulocyte 2.2 2 - 6.9   Granulocyte percent 68.0 37 - 80 %G   RBC 3.93 (A) 4.69 - 6.13 M/uL   Hemoglobin 10.5 (A) 14.1 - 18.1 g/dL   HCT, POC 69.633.8 (A) 29.543.5 - 53.7 %   MCV 86.0 80 - 97 fL   MCH, POC 26.7 (A) 27 - 31.2 pg   MCHC 31.1 (A) 31.8 - 35.4 g/dL   RDW, POC 28.417.5 %   Platelet Count, POC 106 (A) 142 - 424 K/uL   MPV 6.8 0 - 99.8 fL  Vitamin B12     Status: None   Collection Time: 12/13/14 10:57 AM  Result Value Ref Range   Vitamin B-12 672 211 - 911 pg/mL   Narrative   Performed at:  [facility]                [address], [address]                [place], [place] [address]  IBC Panel     Status: None (Preliminary result)   Collection Time: 12/13/14 10:57 AM  Result Value Ref Range   UIBC  125 - 400 ug/dL   TIBC  272215 - 536435 ug/dL   %SAT  20 - 55 %   Narrative   Performed at:  [facility]                [address], [address]                [place], [place] [address]  Reticulocytes     Status: Abnormal   Collection Time: 12/13/14 10:57 AM  Result Value Ref Range   Retic Ct Pct 1.8 0.4 - 2.3 %   RBC. 3.88 (L) 4.22 - 5.81 MIL/uL   ABS Retic 69.8 19.0 - 186.0 K/uL   Narrative   Performed at:  [facility]  [address]                [place], [place] [address]  Folate     Status: None   Collection Time: 12/13/14 10:57 AM  Result Value Ref Range   Folate 8.7 ng/mL   Narrative   Performed at:  [facility]                [address]                [place], [place] [address]    ASSESSMENT & PLAN  Devin Noble was seen today for hernia.   Diagnoses and all orders for this  visit:  Abdominal mass, left upper quadrant:  Most likely a hernia.  Will refer to surgery for evaluation and management. The mass can be reduced and does not appear strangulated at this time.  Advised that he seek emergency care if develops intractable pain.   Orders: -     POCT CBC -     DG Abd Acute W/Chest; Future -     AMB surgical consult  Heavy drinker: Labs reveal a pancytopenia, AST elevated out of proportion to ALT, Band bilirubin elevated.  This is likely causing the patient's abdominal swelling, which has possibly made his hernia pain increase over the last three weeks.  Fortunately the patient is amenable to alcohol cessation at this time.  I have called in a tapered Librium dose and patient is willing to follow through with this.  Patient also amenable to seeing GI for further workup and management of today's findings.   Orders: -     Comprehensive metabolic panel -     Lipase -     AMB GI -     Librium taper  Alcoholic Hepatitis with ascites: Managed with problem two. Patient afebrile and anicteric at this time.    Hyperbilirubinemia: Managed with problem two.  Thrombocytopenia: Managed with problem two.     The patient was advised to call or come back to clinic if he does not see an improvement in symptoms, or worsens with the above plan.   Deliah Boston, MHS, PA-C [facility] 12/13/2014 2:48 PM

## 2014-12-15 LAB — IBC PANEL
%SAT: 13 % — AB (ref 20–55)
TIBC: 364 ug/dL (ref 215–435)
UIBC: 318 ug/dL (ref 125–400)

## 2014-12-15 LAB — IRON: Iron: 46 ug/dL (ref 42–165)

## 2014-12-16 ENCOUNTER — Telehealth: Payer: Self-pay

## 2014-12-16 LAB — HEPATITIS B SURFACE ANTIBODY, QUANTITATIVE: HEPATITIS B-POST: 1.1 m[IU]/mL

## 2014-12-16 LAB — HEPATITIS C ANTIBODY: HCV AB: NEGATIVE

## 2014-12-16 LAB — HEPATITIS B SURFACE ANTIGEN: HEP B S AG: NEGATIVE

## 2014-12-16 NOTE — Telephone Encounter (Signed)
Spoke with patient.  Advised that given the condition of his liver that go directly to the ED at [facility] for further evaluation and workup.  He is amenable to this and I have spoken to Irving BurtonEmily, the charge nurse at [facility] and she is aware that he is on his way.  Deliah BostonMichael Clark, MS, PA-C   5:35 PM, 12/16/2014

## 2014-12-16 NOTE — Telephone Encounter (Signed)
Please advise 

## 2014-12-16 NOTE — Progress Notes (Signed)
Reviewed in detail with Deliah BostonMichael Clark PAc

## 2014-12-16 NOTE — Telephone Encounter (Signed)
I contacted patient to give him information for a referral to [facility] which is scheduled for 12/26/14 @ 9 am. This was  GI first available appointment. Patient wanted to let Deliah BostonMichael Clark PA know he has seen some blood in his stool which started yesterday. I advised patient if it continues you he should definitely come in and see someone at our clinic. Patients call back number is [phone]

## 2014-12-26 ENCOUNTER — Encounter: Payer: Self-pay | Admitting: Gastroenterology

## 2014-12-26 ENCOUNTER — Ambulatory Visit (INDEPENDENT_AMBULATORY_CARE_PROVIDER_SITE_OTHER): Payer: Self-pay | Admitting: Gastroenterology

## 2014-12-26 ENCOUNTER — Other Ambulatory Visit: Payer: Self-pay

## 2014-12-26 VITALS — BP 126/70 | HR 100 | Ht 71.0 in | Wt 249.0 lb

## 2014-12-26 DIAGNOSIS — K625 Hemorrhage of anus and rectum: Secondary | ICD-10-CM | POA: Insufficient documentation

## 2014-12-26 DIAGNOSIS — D649 Anemia, unspecified: Secondary | ICD-10-CM

## 2014-12-26 DIAGNOSIS — F1010 Alcohol abuse, uncomplicated: Secondary | ICD-10-CM | POA: Insufficient documentation

## 2014-12-26 DIAGNOSIS — R945 Abnormal results of liver function studies: Principal | ICD-10-CM

## 2014-12-26 DIAGNOSIS — R109 Unspecified abdominal pain: Secondary | ICD-10-CM

## 2014-12-26 DIAGNOSIS — R7989 Other specified abnormal findings of blood chemistry: Secondary | ICD-10-CM

## 2014-12-26 NOTE — Patient Instructions (Signed)
You have been scheduled for an endoscopy and colonoscopy. Please follow the written instructions given to you at your visit today. Please pick up your prep supplies at the pharmacy within the next 1-3 days. If you use inhalers (even only as needed), please bring them with you on the day of your procedure. Your physician has requested that you go to www.startemmi.com and enter the access code given to you at your visit today. This web site gives a general overview about your procedure. However, you should still follow specific instructions given to you by our office regarding your preparation for the procedure.  You have been scheduled for an abdominal ultrasound at [facility] (1st floor of hospital) on Thursday, 01/02/15 at 8:30 am. Please arrive 15 minutes prior to your appointment for registration. Make certain not to have anything to eat or drink 6 hours prior to your appointment. Should you need to reschedule your appointment, please contact radiology at [phone]. This test typically takes about 30 minutes to perform.  Your physician has requested that you go to the basement for the following lab work before leaving today: CBC, Hepatic function, Ferritin, IgA, TtG, PT, AFP, Alpha 1 antitrypsin, mitochondrial antibody, ana, anti smooth muscle antibody, ceruloplasmin, Hep A antibody

## 2014-12-26 NOTE — Progress Notes (Signed)
12/26/2014 Devin Noble [id number] 1955/09/29   HISTORY OF PRESENT ILLNESS:  This is a 59 year old male who is new to our practice and was referred here by Dr. Perrin Maltese and the PA, Deliah Boston, for evaluation of elevated LFT's and abdominal pain/swelling.  Patient admits to excessive ETOH use; says that he drinks at least 12 beers per day and sometimes more.  Says it is his "hobby".  Has been noticing some weight gain/swelling of his abdomen and has some mid-abdominal discomfort.  Has lower extremity swelling as well.  Lipase was normal.  BMP was normal.  LFT's were elevated with total bili 2.7, ALP 105, AST 88, ALT 38.  Last for comparison were one year ago with total bili 1.4, ALP 99, AST 112, and ALT 55.  Hepatitis B surface Ag and surface Ab were both negative.  Hep C Ab negative.  Folate and Vitamin B12 both WNL's.  Iron was normal, but borderline low, TIBC normal at 364, and % sat was low at 13.  No ferritin was drawn.  CBC shows pancytopenia with WBC 3.3, Hgb 10.5 grams, and platelets 106 just 2 weeks ago.  Denies any yellowing of his skin or eyes.  No confusion.  No nausea, vomiting, or black stools.    He does admit to a couple of episodes of bright red rectal bleeding last week, which has since resolved.  Says that he had a colonoscopy over 10 years ago.     Past Medical History  Diagnosis Date  . Hypertension     takes Lisinopril/HCTZ daily  . Birthmark of skin     "right eye" (01/30/2013)  . GERD (gastroesophageal reflux disease)     seldom  . Arthritis    Past Surgical History  Procedure Laterality Date  . 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 Colonoscopy  ~ 2009  . Total shoulder arthroplasty Left 01/30/2013    Procedure: TOTAL SHOULDER ARTHROPLASTY;  Surgeon: Mable Paris, MD;  Location: [facility] OR;  Service: Orthopedics;  Laterality: Left;  . Total shoulder arthroplasty Right 05/24/2013    Dr Ave Filter  . Total shoulder arthroplasty Right 05/24/2013   Procedure: RIGHT TOTAL SHOULDER ARTHROPLASTY;  Surgeon: Mable Paris, MD;  Location: [facility] OR;  Service: Orthopedics;  Laterality: Right;  Right total shoulder replacement    reports that he has never smoked. He has never used smokeless tobacco. He reports that he drinks about 14.4 oz of alcohol per week. He reports that he does not use illicit drugs. family history includes Gallbladder disease in his mother. There is no history of Colon cancer, Colon polyps, Kidney disease, Diabetes, Esophageal cancer, or Heart disease. Allergies  Allergen Reactions  . Tramadol Hives      Outpatient Encounter Prescriptions as of 12/26/2014  Medication Sig  . lisinopril-hydrochlorothiazide (PRINZIDE,ZESTORETIC) 20-25 MG per tablet Take 1 tablet by mouth daily.     REVIEW OF SYSTEMS  : All other systems reviewed and negative except where noted in the History of Present Illness.   PHYSICAL EXAM: BP 126/70 mmHg  Pulse 100  Ht  (1.803 m)  Wt 249 lb (112.946 kg)  BMI 34.74 kg/m2 General: Well developed white male in no acute distress Head: Normocephalic and atraumatic Eyes:  Sclerae anicteric, conjunctiva pink.  Right eye redness. Ears: Normal auditory acuity Lungs: Clear throughout to auscultation Heart: Regular rate and rhythm Abdomen: Soft, distended with what I suspect is some ascites fluid.  BS present.  Mild mid-abdominal  TTP. Musculoskeletal: Symmetrical with no gross deformities  Skin: No lesions on visible extremities Extremities:  2-3+ pre-tibial pitting edema in B/L LE's. Neurological: Alert oriented x 4, grossly non-focal Psychological:  Alert and cooperative. 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  Platelets are low.   Has lower extremity swelling and I suspect that he has some ascites by exam.  Hepatitis B and C are negative.  Will check remaining liver  serologies to rule out other causes of liver disease.  Check PT/INR and repeat LFT's as well as an AFP since high suspicon for cirrhosis.  Abdominal ultrasound also ordered.  He will likely need some diuretics.  Hep B Ab is negative and if Hep A negative as well then will need vaccinations for both of these.  I have advised him that he absolutely needs to discontinue all ETOH use. -Rectal bleeding:  Has colonoscopy over 10 years ago according to his report.  Had some bright red bleeding last week.  Hgb 10.5 grams two weeks ago.  Will recheck that today as well.  Will schedule colonoscopy with Dr. Juanda ChanceBrodie. -Epigastric/mid-abdominal pain:  Will schedule EGD as well, which will serve as evaluation for this pain, but also to screening for esophageal varices and evaluate the anemia. -Anemia:  Normocytic.  Vitamin B12 and folate are normal.  Iron levels borderline low, but no ferritin checked.  May be anemia of chronic disease, but will be evaluated with EGD anyway for other reasons as stated above.  *Addendum, apparently patient went to the lab to have lab studies drawn and then refused to have them drawn after asking what the cost would be for each study.  CC:  Ofilia Neaslark, Michael L, PA-C

## 2014-12-26 NOTE — Progress Notes (Signed)
Reviewed. ?Thiamine  IM or po. Please add ammonia, b-met to watch for hypokalemia. I soul add Ativan to help with alcohol withdrawal/risk of seizure.

## 2015-01-01 ENCOUNTER — Telehealth: Payer: Self-pay | Admitting: Gastroenterology

## 2015-01-01 NOTE — Telephone Encounter (Signed)
Patient given radiology number.

## 2015-01-02 ENCOUNTER — Ambulatory Visit (HOSPITAL_COMMUNITY): Payer: Self-pay

## 2015-01-04 ENCOUNTER — Emergency Department (HOSPITAL_COMMUNITY): Payer: Self-pay

## 2015-01-04 ENCOUNTER — Encounter (HOSPITAL_COMMUNITY): Payer: Self-pay | Admitting: Family Medicine

## 2015-01-04 ENCOUNTER — Emergency Department (HOSPITAL_COMMUNITY)
Admission: EM | Admit: 2015-01-04 | Discharge: 2015-01-05 | Disposition: A | Discharge status: Home / Self Care | Payer: Self-pay | Attending: Emergency Medicine | Admitting: Emergency Medicine

## 2015-01-04 DIAGNOSIS — Z8719 Personal history of other diseases of the digestive system: Secondary | ICD-10-CM | POA: Insufficient documentation

## 2015-01-04 DIAGNOSIS — Z8739 Personal history of other diseases of the musculoskeletal system and connective tissue: Secondary | ICD-10-CM | POA: Insufficient documentation

## 2015-01-04 DIAGNOSIS — R0602 Shortness of breath: Secondary | ICD-10-CM | POA: Insufficient documentation

## 2015-01-04 DIAGNOSIS — I10 Essential (primary) hypertension: Secondary | ICD-10-CM | POA: Insufficient documentation

## 2015-01-04 DIAGNOSIS — R188 Other ascites: Secondary | ICD-10-CM | POA: Insufficient documentation

## 2015-01-04 DIAGNOSIS — Z79899 Other long term (current) drug therapy: Secondary | ICD-10-CM | POA: Insufficient documentation

## 2015-01-04 DIAGNOSIS — M7989 Other specified soft tissue disorders: Secondary | ICD-10-CM | POA: Insufficient documentation

## 2015-01-04 LAB — CBC WITH DIFFERENTIAL/PLATELET
Basophils Absolute: 0.2 10*3/uL — ABNORMAL HIGH (ref 0.0–0.1)
Basophils Relative: 3 % — ABNORMAL HIGH (ref 0–1)
EOS PCT: 12 % — AB (ref 0–5)
Eosinophils Absolute: 0.9 10*3/uL — ABNORMAL HIGH (ref 0.0–0.7)
HEMATOCRIT: 29.9 % — AB (ref 39.0–52.0)
Hemoglobin: 9.9 g/dL — ABNORMAL LOW (ref 13.0–17.0)
LYMPHS ABS: 1.6 10*3/uL (ref 0.7–4.0)
Lymphocytes Relative: 22 % (ref 12–46)
MCH: 26.4 pg (ref 26.0–34.0)
MCHC: 33.1 g/dL (ref 30.0–36.0)
MCV: 79.7 fL (ref 78.0–100.0)
MONO ABS: 1.2 10*3/uL — AB (ref 0.1–1.0)
Monocytes Relative: 17 % — ABNORMAL HIGH (ref 3–12)
Neutro Abs: 3.3 10*3/uL (ref 1.7–7.7)
Neutrophils Relative %: 46 % (ref 43–77)
PLATELETS: 169 10*3/uL (ref 150–400)
RBC: 3.75 MIL/uL — ABNORMAL LOW (ref 4.22–5.81)
RDW: 18.2 % — AB (ref 11.5–15.5)
WBC: 7.2 10*3/uL (ref 4.0–10.5)

## 2015-01-04 LAB — POC OCCULT BLOOD, ED: Fecal Occult Bld: NEGATIVE

## 2015-01-04 LAB — BRAIN NATRIURETIC PEPTIDE: B NATRIURETIC PEPTIDE 5: 45.6 pg/mL (ref 0.0–100.0)

## 2015-01-04 LAB — COMPREHENSIVE METABOLIC PANEL
ALK PHOS: 161 U/L — AB (ref 39–117)
ALT: 28 U/L (ref 0–53)
AST: 78 U/L — ABNORMAL HIGH (ref 0–37)
Albumin: 2.7 g/dL — ABNORMAL LOW (ref 3.5–5.2)
Anion gap: 12 (ref 5–15)
BUN: <5 mg/dL — ABNORMAL LOW (ref 6–23)
CALCIUM: 8 mg/dL — AB (ref 8.4–10.5)
CO2: 23 mmol/L (ref 19–32)
CREATININE: 0.69 mg/dL (ref 0.50–1.35)
Chloride: 97 mmol/L (ref 96–112)
GFR calc Af Amer: >90 mL/min (ref >90)
GLUCOSE: 100 mg/dL — AB (ref 70–99)
Potassium: 3.3 mmol/L — ABNORMAL LOW (ref 3.5–5.1)
Sodium: 132 mmol/L — ABNORMAL LOW (ref 135–145)
Total Bilirubin: 2.3 mg/dL — ABNORMAL HIGH (ref 0.3–1.2)
Total Protein: 7.1 g/dL (ref 6.0–8.3)

## 2015-01-04 LAB — LIPASE, BLOOD: LIPASE: 39 U/L (ref 11–59)

## 2015-01-04 LAB — I-STAT TROPONIN, ED: Troponin i, poc: 0 ng/mL (ref 0.00–0.08)

## 2015-01-04 LAB — PROTIME-INR
INR: 1.7 — ABNORMAL HIGH (ref 0.00–1.49)
Prothrombin Time: 20.1 seconds — ABNORMAL HIGH (ref 11.6–15.2)

## 2015-01-04 MED ORDER — LIDOCAINE-EPINEPHRINE (PF) 1 %-1:200000 IJ SOLN
20.0000 mL | Freq: Once | INTRAMUSCULAR | Status: DC
  Filled 2015-01-04: qty 20

## 2015-01-04 MED ORDER — IOHEXOL 300 MG/ML  SOLN
25.0000 mL | Freq: Once | INTRAMUSCULAR | Status: AC | PRN
  Administered 2015-01-04: 25 mL via ORAL

## 2015-01-04 MED ORDER — LIDOCAINE-EPINEPHRINE (PF) 2 %-1:200000 IJ SOLN
20.0000 mL | Freq: Once | INTRAMUSCULAR | Status: DC
  Filled 2015-01-04: qty 20

## 2015-01-04 MED ORDER — IOHEXOL 300 MG/ML  SOLN
100.0000 mL | Freq: Once | INTRAMUSCULAR | Status: AC | PRN
  Administered 2015-01-04: 100 mL via INTRAVENOUS

## 2015-01-04 NOTE — ED Provider Notes (Signed)
CSN: [id number]     Arrival date & time 01/04/15  1754 History   First MD Initiated Contact with Patient 01/04/15 1942     Chief Complaint  Patient presents with  . Abdominal Pain     (Consider location/radiation/quality/duration/timing/severity/associated sxs/prior Treatment) Patient is a 59 y.o. male presenting with abdominal pain. The history is provided by the patient.  Abdominal Pain Pain location:  Generalized Pain quality: bloating and fullness   Pain radiates to:  Does not radiate Pain severity:  Mild Onset quality:  Gradual Duration:  1 week Timing:  Constant Progression:  Worsening Chronicity:  New Context comment:  Etoh abuse Relieved by:  Nothing Worsened by:  Nothing tried Ineffective treatments:  None tried Associated symptoms: cough and shortness of breath   Associated symptoms: no chest pain, no diarrhea, no dysuria, no fever, no hematuria, no nausea and no vomiting     Past Medical History  Diagnosis Date  . Hypertension     takes Lisinopril/HCTZ daily  . Birthmark of skin     "right eye" (01/30/2013)  . GERD (gastroesophageal reflux disease)     seldom  . Arthritis    Past Surgical History  Procedure Laterality Date  . 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 Colonoscopy  ~ 2009  . Total shoulder arthroplasty Left 01/30/2013    Procedure: TOTAL SHOULDER ARTHROPLASTY;  Surgeon: Mable Paris, MD;  Location: [facility] OR;  Service: Orthopedics;  Laterality: Left;  . Total shoulder arthroplasty Right 05/24/2013    Dr Ave Filter  . Total shoulder arthroplasty Right 05/24/2013    Procedure: RIGHT TOTAL SHOULDER ARTHROPLASTY;  Surgeon: Mable Paris, MD;  Location: [facility] OR;  Service: Orthopedics;  Laterality: Right;  Right total shoulder replacement   Family History  Problem Relation Age of Onset  . Colon cancer Neg Hx   . Colon polyps Neg Hx   . Kidney disease Neg Hx   . Diabetes Neg Hx   . Gallbladder disease Mother   . Esophageal cancer  Neg Hx   . Heart disease Neg Hx    History  Substance Use Topics  . Smoking status: Never Smoker   . Smokeless tobacco: Never Used  . Alcohol Use: 14.4 oz/week    24 Cans of beer per week     Comment: daily beer    Review of Systems  Constitutional: Negative for fever.  HENT: Negative for drooling and rhinorrhea.   Eyes: Negative for pain.  Respiratory: Positive for cough and shortness of breath.   Cardiovascular: Positive for leg swelling. Negative for chest pain.  Gastrointestinal: Negative for nausea, vomiting, abdominal pain and diarrhea.  Genitourinary: Negative for dysuria and hematuria.       Blood in stool 2 weeks ago  Musculoskeletal: Negative for gait problem and neck pain.  Skin: Negative for color change.  Neurological: Negative for numbness and headaches.  Hematological: Negative for adenopathy.  Psychiatric/Behavioral: Negative for behavioral problems.  All other systems reviewed and are negative.     Allergies  Tramadol  Home Medications   Prior to Admission medications   Medication Sig Start Date End Date Taking? Authorizing Provider  lisinopril-hydrochlorothiazide (PRINZIDE,ZESTORETIC) 20-25 MG per tablet Take 1 tablet by mouth daily.    Historical Provider, MD   BP 130/83 mmHg  Pulse 105  Temp(Src) 98.6 F (37 C) (Oral)  Resp 18  SpO2 97% Physical Exam  Constitutional: He is oriented to person, place, and time. He appears well-developed and well-nourished.  HENT:  Head:  Normocephalic and atraumatic.  Right Ear: External ear normal.  Left Ear: External ear normal.  Nose: Nose normal.  Mouth/Throat: Oropharynx is clear and moist. No oropharyngeal exudate.  Discoloration of the right lateral upper and lower eyelid with mild ectropion.  Eyes: Conjunctivae and EOM are normal. Pupils are equal, round, and reactive to light.  Neck: Normal range of motion. Neck supple.  Cardiovascular: Regular rhythm, normal heart sounds and intact distal pulses.   Exam reveals no gallop and no friction rub.   No murmur heard. Pulmonary/Chest: Effort normal and breath sounds normal. No respiratory distress. He has no wheezes.  Abdominal: Soft. Bowel sounds are normal. He exhibits distension (moderate distension of abdomen). There is no tenderness. There is no rebound and no guarding.  Musculoskeletal: Normal range of motion. He exhibits edema (mild to moderate pitting edema in bilateral lower extremities extending up to the knees.). He exhibits no tenderness.  Neurological: He is alert and oriented to person, place, and time.  Skin: Skin is warm and dry.  Psychiatric: He has a normal mood and affect. His behavior is normal.  Nursing note and vitals reviewed.   ED Course  PARACENTESIS Date/Time: 01/05/2015 12:36 PM Performed by: Purvis Sheffield Authorized by: Purvis Sheffield Consent: Verbal consent obtained. Written consent obtained. Risks and benefits: risks, benefits and alternatives were discussed Consent given by: patient Patient understanding: patient states understanding of the procedure being performed Patient consent: the patient's understanding of the procedure matches consent given Procedure consent: procedure consent matches procedure scheduled Relevant documents: relevant documents present and verified Test results: test results available and properly labeled Site marked: the operative site was marked Imaging studies: imaging studies available Required items: required blood products, implants, devices, and special equipment available Patient identity confirmed: verbally with patient, arm band, provided demographic data and hospital-assigned identification number Time out: Immediately prior to procedure a "time out" was called to verify the correct patient, procedure, equipment, support staff and site/side marked as required. Initial or subsequent exam: initial Procedure purpose: diagnostic and therapeutic Indications: abdominal  discomfort secondary to ascites and new onset ascites Local anesthetic: lidocaine 1% with epinephrine Anesthetic total: 2 ml Patient sedated: no Preparation: Patient was prepped and draped in the usual sterile fashion. Needle gauge: 18 Ultrasound guidance: [place] used to find site on abdomen with adequate fluid. Puncture site: left lower quadrant Fluid removed: 1700(ml) Fluid appearance: serous Dressing: pressure dressing Patient tolerance: Patient tolerated the procedure well with no immediate complications Comments: Dermabond used to help stop mild drainage of ascites from puncture site.    (including critical care time) Labs Review Labs Reviewed  CBC WITH DIFFERENTIAL/PLATELET - Abnormal; Notable for the following:    RBC 3.75 (*)    Hemoglobin 9.9 (*)    HCT 29.9 (*)    RDW 18.2 (*)    Monocytes Relative 17 (*)    Eosinophils Relative 12 (*)    Basophils Relative 3 (*)    Monocytes Absolute 1.2 (*)    Eosinophils Absolute 0.9 (*)    Basophils Absolute 0.2 (*)    All other components within normal limits  COMPREHENSIVE METABOLIC PANEL - Abnormal; Notable for the following:    Sodium 132 (*)    Potassium 3.3 (*)    Glucose, Bld 100 (*)    BUN <5 (*)    Calcium 8.0 (*)    Albumin 2.7 (*)    AST 78 (*)    Alkaline Phosphatase 161 (*)    Total Bilirubin 2.3 (*)  All other components within normal limits  LIPASE, BLOOD  BRAIN NATRIURETIC PEPTIDE  PROTIME-INR  OCCULT BLOOD X 1 CARD TO LAB, STOOL  I-STAT TROPOININ, ED    Imaging Review Dg Chest 2 View  01/04/2015   CLINICAL DATA:  Shortness of breath and edema throughout the legs.  EXAM: CHEST  2 VIEW  COMPARISON:  12/13/2014  FINDINGS: Normal heart size and pulmonary vascularity. Slight atelectasis in the lung bases. No focal consolidation or airspace disease. No blunting of costophrenic angles. No pneumothorax. Mediastinal contours appear intact. Postoperative changes in both shoulders. Mild degenerative changes in the  spine. Compression of T11 vertebra, unchanged since prior study 01/16/2013.  IMPRESSION: Mild atelectasis in the lung bases. No focal consolidation or airspace disease.   Electronically Signed   By: Burman NievesWilliam  Stevens M.D.   On: 01/04/2015 23:48   Ct Abdomen Pelvis W Contrast  01/04/2015   CLINICAL DATA:  Short of breath  EXAM: CT ABDOMEN AND PELVIS WITH CONTRAST  TECHNIQUE: Multidetector CT imaging of the abdomen and pelvis was performed using the standard protocol following bolus administration of intravenous contrast.  CONTRAST:  25mL OMNIPAQUE IOHEXOL 300 MG/ML SOLN, 100mL OMNIPAQUE IOHEXOL 300 MG/ML SOLN  COMPARISON:  None.  FINDINGS: Lower chest:  No pleural effusion.  The lung bases are clear.  Hepatobiliary: Diffusely abnormal appearance of the liver compatible with advanced cirrhosis with innumerable small regenerating nodules. There is recanalization of the umbilical vein. The gallbladder appears normal. There is no biliary dilatation.  Pancreas: Negative  Spleen: The spleen is enlarged measuring 16.2 cm in length.  Adrenals/Urinary Tract: Normal adrenal glands. The kidneys are both unremarkable. The urinary bladder appears normal.  Stomach/Bowel: The stomach and the small bowel loops have a normal course and caliber. No obstruction. Normal appearance of the colon  Vascular/Lymphatic: Calcified atherosclerotic disease involves the abdominal aorta. No aneurysm. Esophageal varices identified. Numerous small retroperitoneal and mesenteric lymph nodes are identified. No adenopathy. No pelvic or inguinal adenopathy.  Reproductive: Prostate gland and seminal vesicles appear normal.  Other: Moderate to marked amount of upper abdominal ascites identified.  Musculoskeletal: There is degenerative disc disease identified within the lumbar spine. Most advanced at the L5-S1 level. There is a compression fracture involving the T11 vertebra. Loss of 80% of the vertebral body height is noted. This is age indeterminate.   IMPRESSION: 1. Morphologic features of liver compatible with advanced cirrhosis. 2. Stigmata of portal venous hypertension including esophageal varices, splenomegaly and marked abdominal ascites. 3. Atherosclerotic disease. 4. Age-indeterminate T11 compression fracture 5. Degenerative disc disease.   Electronically Signed   By: Signa Kellaylor  Stroud M.D.   On: 01/04/2015 21:43     EKG Interpretation   Date/Time:  Saturday January 04 2015 21:17:33 EDT Ventricular Rate:  106 PR Interval:  125 QRS Duration: 101 QT Interval:  365 QTC Calculation: 485 R Axis:   -10 Text Interpretation:  Sinus tachycardia No significant change since last  tracing Confirmed by Dayanira Giovannetti  MD, Kathia Covington ([id number]) on 01/04/2015 9:24:55 PM      MDM   Final diagnoses:  SOB (shortness of breath)  Ascites    8:20 PM 59 y.o. male with history of EtOH abuse who presents with abdominal distention and pain. He states he has had abdominal pain over the last 4-6 months. He notes acute worsening with distention over the last week. He states that he drinks about 16 beers per day. He denies any fevers or vomiting. He does note that he saw some bright red  blood in his stool about 2 weeks ago. He has not seen it since. He is afebrile and mildly tachycardic here. Vital signs otherwise unremarkable. Screening lab work is as far noncontributory. Will get CT of abdomen. He declined any pain medicine.  CT reviewed, showing adv cirrhosis and ascites. Age indet T57 comp fx c/w old injury per pt. Given new onset ascites will perform paracentesis.   Removed 1700 cc serous/straw appearing fluid w/ some symptomatic relief. Fluid stopped draining easily at this level so I decided to stop.   Care transferred to Dr. Ranae Palms to f/u on ascitic fluid analysis. Low suspicion for sbp. Plan for pt to follow up at wellness center as he is uninsured. I did go ahead and give him gi f/u as well. Will start on lasix for the time being to help w/ LE edema. He is  feeling better on exam and would like to go home. Return precautions for return of sob, worsening abd pain/distension, fever, bloody stools/emesis. He understands. Pt mildly tachycardic during his stay but well appearing. I do not think IVF will benefit him given his current fluid overload in his LE's.       Purvis Sheffield, MD 01/05/15 1243

## 2015-01-04 NOTE — ED Notes (Addendum)
Pt here for abd pain and swelling x a few weeks and getting worse. sts some diarrhea. sts he has been seen at [facility] for the same. sts drinking beer helps the pain.

## 2015-01-04 NOTE — ED Notes (Addendum)
Pt stated that his gums have been bleeding recently, "like it's always trickling out", "been bleeding pretty freely for a while". "I wake up and my pillow is red from where I have been sleeping".  Pt states that he has been "losing control of bowels, I just walked in the door and messed in my pants". Pt states that this is unusual for him.

## 2015-01-04 NOTE — ED Notes (Addendum)
Pt stated that about 1 week ago he passed "about a pint of blood" in his stool. Pt states that he has not had any other abnormal bleeding since then.

## 2015-01-04 NOTE — ED Notes (Signed)
Dr Harrison at bedside

## 2015-01-05 LAB — GRAM STAIN: Special Requests: NORMAL

## 2015-01-05 LAB — ALBUMIN, FLUID (OTHER)

## 2015-01-05 LAB — BODY FLUID CELL COUNT WITH DIFFERENTIAL
Lymphs, Fluid: 15 %
Monocyte-Macrophage-Serous Fluid: 71 % (ref 50–90)
Neutrophil Count, Fluid: 14 % (ref 0–25)
WBC FLUID: 219 uL (ref 0–1000)

## 2015-01-05 LAB — PROTEIN, BODY FLUID

## 2015-01-05 LAB — GLUCOSE, PERITONEAL FLUID: Glucose, Peritoneal Fluid: 98 mg/dL

## 2015-01-05 LAB — LACTATE DEHYDROGENASE, PLEURAL OR PERITONEAL FLUID: LD, Fluid: 39 U/L — ABNORMAL HIGH (ref 3–23)

## 2015-01-05 MED ORDER — OXYCODONE HCL 5 MG PO TABS: 5.0000 mg | ORAL_TABLET | Freq: Four times a day (QID) | ORAL | Status: DC | PRN

## 2015-01-05 MED ORDER — FUROSEMIDE 20 MG PO TABS: 20.0000 mg | ORAL_TABLET | Freq: Every day | ORAL | Status: DC

## 2015-01-05 NOTE — Discharge Instructions (Signed)
Ascites °Ascites is a gathering of fluid in the belly (abdomen). This is most often caused by liver disease. It may also be caused by a number of other less common problems. It causes a ballooning out (distension) of the abdomen. °CAUSES  °Scarring of the liver (cirrhosis) is the most common cause of ascites. Other causes include: °· Infection or inflammation in the abdomen. °· Cancer in the abdomen. °· Heart failure. °· Certain forms of kidney failure (nephritic syndrome). °· Inflammation of the pancreas. °· Clots in the veins of the liver. °SYMPTOMS  °In the early stages of ascites, you may not have any symptoms. The main symptom of ascites is a sense of abdominal bloating. This is due to the presence of fluid. This may also cause an increase in abdominal or waist size. People with this condition can develop swelling in the legs, and men can develop a swollen scrotum. When there is a lot of fluid, it may be hard to breath. Stretching of the abdomen by fluid can be painful. °DIAGNOSIS  °Certain features of your medical history, such as a history of liver disease and of an enlarging abdomen, can suggest the presence of ascites. The diagnosis of ascites can be made on physical exam by your caregiver. An abdominal ultrasound examination can confirm that ascites is present, and estimate the amount of fluid. °Once ascites is confirmed, it is important to determine its cause. Again, a history of one of the conditions listed in "CAUSES" provides a strong clue. A physical exam is important, and blood and X-ray tests may be needed. During a procedure called paracentesis, a sample of fluid is removed from the abdomen. This can determine certain key features about the fluid, such as whether or not infection or cancer is present. Your caregiver will determine if a paracentesis is necessary. They will describe the procedure to you. °PREVENTION  °Ascites is a complication of other conditions. Therefore to prevent ascites, you  must seek treatment for any significant health conditions you have. Once ascites is present, careful attention to fluid and salt intake may help prevent it from getting worse. If you have ascites, you should not drink alcohol. °PROGNOSIS  °The prognosis of ascites depends on the underlying disease. If the disease is reversible, such as with certain infections or with heart failure, then ascites may improve or disappear. When ascites is caused by cirrhosis, then it indicates that the liver disease has worsened, and further evaluation and treatment of the liver disease is needed. If your ascites is caused by cancer, then the success or failure of the cancer treatment will determine whether your ascites will improve or worsen. °RISKS AND COMPLICATIONS  °Ascites is likely to worsen if it is not properly diagnosed and treated. A large amount of ascites can cause pain and difficulty breathing. The main complication, besides worsening, is infection (called spontaneous bacterial peritonitis). This requires prompt treatment. °TREATMENT  °The treatment of ascites depends on its cause. When liver disease is your cause, medical management using water pills (diuretics) and decreasing salt intake is often effective. Ascites due to peritoneal inflammation or malignancy (cancer) alone does not respond to salt restriction and diuretics. Hospitalization is sometimes required. °If the treatment of ascites cannot be managed with medications, a number of other treatments are available. Your caregivers will help you decide which will work best for you. Some of these are: °· Removal of fluid from the abdomen (paracentesis). °· Fluid from the abdomen is passed into a vein (peritoneovenous shunting). °·   Liver transplantation. °· Transjugular intrahepatic portosystemic stent shunt. °HOME CARE INSTRUCTIONS  °It is important to monitor body weight and the intake and output of fluids. Weigh yourself at the same time every day. Record your  weights. Fluid restriction may be necessary. It is also important to know your salt intake. The more salt you take in, the more fluid you will retain. Ninety percent of people with ascites respond to this approach. °· Follow any directions for medicines carefully. °· Follow up with your caregiver, as directed. °· Report any changes in your health, especially any new or worsening symptoms. °· If your ascites is from liver disease, avoid alcohol and other substances toxic to the liver. °SEEK MEDICAL CARE IF:  °· Your weight increases more than a few pounds in a few days. °· Your abdominal or waist size increases. °· You develop swelling in your legs. °· You had swelling and it worsens. °SEEK IMMEDIATE MEDICAL CARE IF:  °· You develop a fever. °· You develop new abdominal pain. °· You develop difficulty breathing. °· You develop confusion. °· You have bleeding from the mouth, stomach, or rectum. °MAKE SURE YOU:  °· Understand these instructions. °· Will watch your condition. °· Will get help right away if you are not doing well or get worse. °Document Released: 09/13/2005 Document Revised: 12/06/2011 Document Reviewed: 04/14/2007 °[employer]® Patient Information ©2015 [employer]. This information is not intended to replace advice given to you by your health care provider. Make sure you discuss any questions you have with your health care provider. ° °

## 2015-01-05 NOTE — ED Provider Notes (Signed)
Patient's PMD and in account for his ascitic fluid is 30. Not consistent with spontaneous speculum peritonitis. Abdomen is soft and nontender. She will be discharged home to follow-up with [facility]. . Return precautions given.  Loren Raceravid Glennis Montenegro, MD 01/05/15 [phone]

## 2015-01-05 NOTE — ED Notes (Signed)
Dr Harrison at bedside

## 2015-01-05 NOTE — ED Notes (Signed)
Dr Yelverton at bedside.  

## 2015-01-07 LAB — PATHOLOGIST SMEAR REVIEW

## 2015-01-08 LAB — BODY FLUID CULTURE
CULTURE: NO GROWTH
SPECIAL REQUESTS: NORMAL

## 2015-01-08 NOTE — Progress Notes (Signed)
Reviewed. Pt ? Refused to have blood tests drawn.

## 2015-01-10 ENCOUNTER — Ambulatory Visit (HOSPITAL_COMMUNITY): Payer: Self-pay

## 2015-01-29 ENCOUNTER — Encounter: Payer: Self-pay | Admitting: Internal Medicine

## 2015-08-20 DIAGNOSIS — B86 Scabies: Secondary | ICD-10-CM | POA: Diagnosis not present

## 2015-09-28 DIAGNOSIS — J189 Pneumonia, unspecified organism: Secondary | ICD-10-CM

## 2015-09-28 HISTORY — DX: Pneumonia, unspecified organism: J18.9

## 2015-09-30 ENCOUNTER — Encounter (HOSPITAL_COMMUNITY): Payer: Self-pay | Admitting: Emergency Medicine

## 2015-09-30 ENCOUNTER — Emergency Department (HOSPITAL_COMMUNITY): Payer: Medicare Other

## 2015-09-30 ENCOUNTER — Inpatient Hospital Stay (HOSPITAL_COMMUNITY)
Admission: EM | Admit: 2015-09-30 | Discharge: 2015-10-03 | DRG: 193 | Disposition: A | Discharge status: Home / Self Care | Payer: Medicare Other | Attending: Internal Medicine | Admitting: Internal Medicine

## 2015-09-30 DIAGNOSIS — K429 Umbilical hernia without obstruction or gangrene: Secondary | ICD-10-CM | POA: Diagnosis present

## 2015-09-30 DIAGNOSIS — D689 Coagulation defect, unspecified: Secondary | ICD-10-CM | POA: Diagnosis present

## 2015-09-30 DIAGNOSIS — Z79899 Other long term (current) drug therapy: Secondary | ICD-10-CM

## 2015-09-30 DIAGNOSIS — J189 Pneumonia, unspecified organism: Secondary | ICD-10-CM | POA: Diagnosis not present

## 2015-09-30 DIAGNOSIS — Z96611 Presence of right artificial shoulder joint: Secondary | ICD-10-CM | POA: Diagnosis present

## 2015-09-30 DIAGNOSIS — Z96612 Presence of left artificial shoulder joint: Secondary | ICD-10-CM | POA: Diagnosis present

## 2015-09-30 DIAGNOSIS — K7031 Alcoholic cirrhosis of liver with ascites: Secondary | ICD-10-CM | POA: Diagnosis present

## 2015-09-30 DIAGNOSIS — R188 Other ascites: Secondary | ICD-10-CM | POA: Diagnosis not present

## 2015-09-30 DIAGNOSIS — Z6830 Body mass index (BMI) 30.0-30.9, adult: Secondary | ICD-10-CM | POA: Diagnosis not present

## 2015-09-30 DIAGNOSIS — I10 Essential (primary) hypertension: Secondary | ICD-10-CM | POA: Diagnosis present

## 2015-09-30 DIAGNOSIS — J181 Lobar pneumonia, unspecified organism: Secondary | ICD-10-CM

## 2015-09-30 DIAGNOSIS — D696 Thrombocytopenia, unspecified: Secondary | ICD-10-CM | POA: Diagnosis not present

## 2015-09-30 DIAGNOSIS — D500 Iron deficiency anemia secondary to blood loss (chronic): Secondary | ICD-10-CM | POA: Diagnosis not present

## 2015-09-30 DIAGNOSIS — K766 Portal hypertension: Secondary | ICD-10-CM | POA: Diagnosis present

## 2015-09-30 DIAGNOSIS — Z23 Encounter for immunization: Secondary | ICD-10-CM

## 2015-09-30 DIAGNOSIS — R0602 Shortness of breath: Secondary | ICD-10-CM | POA: Diagnosis present

## 2015-09-30 DIAGNOSIS — E871 Hypo-osmolality and hyponatremia: Secondary | ICD-10-CM | POA: Diagnosis present

## 2015-09-30 DIAGNOSIS — E43 Unspecified severe protein-calorie malnutrition: Secondary | ICD-10-CM | POA: Diagnosis present

## 2015-09-30 DIAGNOSIS — K7460 Unspecified cirrhosis of liver: Secondary | ICD-10-CM | POA: Diagnosis present

## 2015-09-30 HISTORY — DX: Restless legs syndrome: G25.81

## 2015-09-30 HISTORY — DX: Unspecified cirrhosis of liver: K74.60

## 2015-09-30 HISTORY — DX: Unspecified abdominal hernia without obstruction or gangrene: K46.9

## 2015-09-30 LAB — COMPREHENSIVE METABOLIC PANEL
ALK PHOS: 140 U/L — AB (ref 38–126)
ALT: 29 U/L (ref 17–63)
ANION GAP: 7 (ref 5–15)
AST: 41 U/L (ref 15–41)
Albumin: 2.9 g/dL — ABNORMAL LOW (ref 3.5–5.0)
BILIRUBIN TOTAL: 2.5 mg/dL — AB (ref 0.3–1.2)
BUN: 9 mg/dL (ref 6–20)
CALCIUM: 8.4 mg/dL — AB (ref 8.9–10.3)
CO2: 21 mmol/L — ABNORMAL LOW (ref 22–32)
Chloride: 102 mmol/L (ref 101–111)
Creatinine, Ser: 0.62 mg/dL (ref 0.61–1.24)
GFR calc non Af Amer: >60 mL/min (ref >60)
Glucose, Bld: 107 mg/dL — ABNORMAL HIGH (ref 65–99)
Potassium: 4 mmol/L (ref 3.5–5.1)
Sodium: 130 mmol/L — ABNORMAL LOW (ref 135–145)
Total Protein: 7.4 g/dL (ref 6.5–8.1)

## 2015-09-30 LAB — CBC
HCT: 30.9 % — ABNORMAL LOW (ref 39.0–52.0)
HEMOGLOBIN: 9.8 g/dL — AB (ref 13.0–17.0)
MCH: 23.8 pg — ABNORMAL LOW (ref 26.0–34.0)
MCHC: 31.7 g/dL (ref 30.0–36.0)
MCV: 75 fL — ABNORMAL LOW (ref 78.0–100.0)
PLATELETS: 258 10*3/uL (ref 150–400)
RBC: 4.12 MIL/uL — AB (ref 4.22–5.81)
RDW: 17.7 % — ABNORMAL HIGH (ref 11.5–15.5)
WBC: 3.9 10*3/uL — ABNORMAL LOW (ref 4.0–10.5)

## 2015-09-30 LAB — LIPASE, BLOOD: Lipase: 30 U/L (ref 11–51)

## 2015-09-30 MED ORDER — DEXTROSE 5 % IV SOLN
1.0000 g | INTRAVENOUS | Status: DC
  Administered 2015-10-01 – 2015-10-02 (×2): 1 g via INTRAVENOUS
  Filled 2015-09-30 (×2): qty 10

## 2015-09-30 MED ORDER — NAPROXEN 250 MG PO TABS
250.0000 mg | ORAL_TABLET | Freq: Every evening | ORAL | Status: DC | PRN
  Administered 2015-10-01: 250 mg via ORAL
  Filled 2015-09-30 (×3): qty 1

## 2015-09-30 MED ORDER — PNEUMOCOCCAL VAC POLYVALENT 25 MCG/0.5ML IJ INJ
0.5000 mL | INJECTION | INTRAMUSCULAR | Status: AC
  Administered 2015-10-01: 0.5 mL via INTRAMUSCULAR
  Filled 2015-09-30 (×2): qty 0.5

## 2015-09-30 MED ORDER — HEPARIN SODIUM (PORCINE) 5000 UNIT/ML IJ SOLN
5000.0000 [IU] | Freq: Three times a day (TID) | INTRAMUSCULAR | Status: DC
  Administered 2015-09-30 – 2015-10-02 (×6): 5000 [IU] via SUBCUTANEOUS
  Filled 2015-09-30 (×8): qty 1

## 2015-09-30 MED ORDER — HYDROXYZINE HCL 25 MG PO TABS
25.0000 mg | ORAL_TABLET | Freq: Every day | ORAL | Status: DC
  Administered 2015-09-30 – 2015-10-02 (×3): 50 mg via ORAL
  Filled 2015-09-30 (×3): qty 2

## 2015-09-30 MED ORDER — AZITHROMYCIN 250 MG PO TABS
500.0000 mg | ORAL_TABLET | ORAL | Status: AC
  Administered 2015-10-01 – 2015-10-02 (×2): 500 mg via ORAL
  Filled 2015-09-30 (×2): qty 2

## 2015-09-30 MED ORDER — DEXTROSE 5 % IV SOLN
500.0000 mg | Freq: Once | INTRAVENOUS | Status: AC
  Administered 2015-09-30: 500 mg via INTRAVENOUS
  Filled 2015-09-30: qty 500

## 2015-09-30 MED ORDER — FUROSEMIDE 20 MG PO TABS
20.0000 mg | ORAL_TABLET | Freq: Every day | ORAL | Status: DC
  Administered 2015-09-30 – 2015-10-02 (×3): 20 mg via ORAL
  Filled 2015-09-30 (×3): qty 1

## 2015-09-30 MED ORDER — NAPROXEN SOD-DIPHENHYDRAMINE 220-25 MG PO TABS: ORAL_TABLET | Freq: Every evening | ORAL | Status: DC | PRN

## 2015-09-30 MED ORDER — ONDANSETRON HCL 4 MG/2ML IJ SOLN: 4.0000 mg | Freq: Four times a day (QID) | INTRAMUSCULAR | Status: DC | PRN

## 2015-09-30 MED ORDER — MORPHINE SULFATE (PF) 2 MG/ML IV SOLN
2.0000 mg | INTRAVENOUS | Status: DC | PRN
  Administered 2015-09-30 – 2015-10-02 (×2): 2 mg via INTRAVENOUS
  Filled 2015-09-30 (×2): qty 1

## 2015-09-30 MED ORDER — OXYCODONE HCL 5 MG PO TABS: 5.0000 mg | ORAL_TABLET | Freq: Four times a day (QID) | ORAL | Status: DC | PRN

## 2015-09-30 MED ORDER — HYDROXYZINE PAMOATE 25 MG PO CAPS: 25.0000 mg | ORAL_CAPSULE | Freq: Every day | ORAL | Status: DC

## 2015-09-30 MED ORDER — DIPHENHYDRAMINE HCL 25 MG PO CAPS
25.0000 mg | ORAL_CAPSULE | Freq: Every evening | ORAL | Status: DC | PRN
  Administered 2015-10-01: 25 mg via ORAL
  Filled 2015-09-30: qty 1

## 2015-09-30 MED ORDER — CEFTRIAXONE SODIUM 1 G IJ SOLR
1.0000 g | Freq: Once | INTRAMUSCULAR | Status: AC
  Administered 2015-09-30: 1 g via INTRAVENOUS
  Filled 2015-09-30: qty 10

## 2015-09-30 NOTE — ED Notes (Signed)
Pt aware of urine sample. Urinal in hand 

## 2015-09-30 NOTE — H&P (Signed)
[facility] History and Physical  Devin Noble ZOX:[id number] DOB: 07/16/1956 DOA: 09/30/2015  Referring physician: EDP PCP: Kaleen Mask, MD   Chief Complaint: SOB   HPI: Devin Noble is a 60 y.o. male h/o EtOH cirrhosis of the liver with ascites last paracentesis was in April of last year.  Patient presents to the ED with c/o 5 weeks worsening abdominal distention and 1 week history of worsening SOB, cough.  No fever or chills.  Nothing makes symptoms better or worse.  Review of Systems: Systems reviewed.  As above, otherwise negative  Past Medical History  Diagnosis Date  . Hypertension     takes Lisinopril/HCTZ daily  . Birthmark of skin     "right eye" (01/30/2013)  . GERD (gastroesophageal reflux disease)     seldom  . Arthritis   . Cirrhosis (HCC)   . Abdominal hernia    Past Surgical History  Procedure Laterality Date  . 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 Colonoscopy  ~ 2009  . Total shoulder arthroplasty Left 01/30/2013    Procedure: TOTAL SHOULDER ARTHROPLASTY;  Surgeon: Mable Paris, MD;  Location: [facility] OR;  Service: Orthopedics;  Laterality: Left;  . Total shoulder arthroplasty Right 05/24/2013    Dr Ave Filter  . Total shoulder arthroplasty Right 05/24/2013    Procedure: RIGHT TOTAL SHOULDER ARTHROPLASTY;  Surgeon: Mable Paris, MD;  Location: [facility] OR;  Service: Orthopedics;  Laterality: Right;  Right total shoulder replacement   Social History:  reports that he has never smoked. He has never used smokeless tobacco. He reports that he drinks about 14.4 oz of alcohol per week. He reports that he does not use illicit drugs.  Allergies  Allergen Reactions  . Tramadol Hives    Family History  Problem Relation Age of Onset  . Colon cancer Neg Hx   . Colon polyps Neg Hx   . Kidney disease Neg Hx   . Diabetes Neg Hx   . Gallbladder disease Mother   . Esophageal cancer Neg Hx   . Heart disease Neg Hx      Prior to  Admission medications   Medication Sig Start Date End Date Taking? Authorizing Provider  furosemide (LASIX) 20 MG tablet Take 1 tablet (20 mg total) by mouth daily. 01/05/15  Yes Purvis Sheffield, MD  hydrOXYzine (VISTARIL) 25 MG capsule TAKE 1 TO 2 CAPSULES BY MOUTH AT BEDTIME (MAY CAUSE DROWSINESS) 08/20/15  Yes Historical Provider, MD  Naproxen Sod-Diphenhydramine (ALEVE PM PO) Take 2 tablets by mouth at bedtime as needed (sleep).    Historical Provider, MD   Physical Exam: Filed Vitals:   09/30/15 1318 09/30/15 1710  BP: 122/93 142/108  Pulse: 111 109  Temp: 97.9 F (36.6 C)   Resp: 18 18    BP 142/108 mmHg  Pulse 109  Temp(Src) 97.9 F (36.6 C) (Oral)  Resp 18  SpO2 99%  General Appearance:    Alert, oriented, no distress, appears stated age  Head:    Normocephalic, atraumatic  Eyes:    PERRL, EOMI, sclera non-icteric        Nose:   Nares without drainage or epistaxis. Mucosa, turbinates normal  Throat:   Moist mucous membranes. Oropharynx without erythema or exudate.  Neck:   Supple. No carotid bruits.  No thyromegaly.  No lymphadenopathy.   Back:     No CVA tenderness, no spinal tenderness  Lungs:     Diminished over left base.  Chest wall:    No  tenderness to palpitation  Heart:    Regular rate and rhythm without murmurs, gallops, rubs  Abdomen:     Soft, non-tender, Distended, tense with ascites, normal bowel sounds, no organomegaly  Genitalia:    deferred  Rectal:    deferred  Extremities:   No clubbing, cyanosis or edema.  Pulses:   2+ and symmetric all extremities  Skin:   Skin color, texture, turgor normal, no rashes or lesions  Lymph nodes:   Cervical, supraclavicular, and axillary nodes normal  Neurologic:   CNII-XII intact. Normal strength, sensation and reflexes      throughout    Labs on Admission:  Basic Metabolic Panel:  Recent Labs Lab 09/30/15 1350  NA 130*  K 4.0  CL 102  CO2 21*  GLUCOSE 107*  BUN 9  CREATININE 0.62  CALCIUM 8.4*    Liver Function Tests:  Recent Labs Lab 09/30/15 1350  AST 41  ALT 29  ALKPHOS 140*  BILITOT 2.5*  PROT 7.4  ALBUMIN 2.9*    Recent Labs Lab 09/30/15 1350  LIPASE 30   No results for input(s): AMMONIA in the last 168 hours. CBC:  Recent Labs Lab 09/30/15 1350  WBC 3.9*  HGB 9.8*  HCT 30.9*  MCV 75.0*  PLT 258   Cardiac Enzymes: No results for input(s): CKTOTAL, CKMB, CKMBINDEX, TROPONINI in the last 168 hours.  BNP (last 3 results) No results for input(s): PROBNP in the last 8760 hours. CBG: No results for input(s): GLUCAP in the last 168 hours.  Radiological Exams on Admission: Dg Chest 2 View  09/30/2015  CLINICAL DATA:  Chest pain and shortness of breath for 2 days. EXAM: CHEST  2 VIEW COMPARISON:  January 04, 2015 FINDINGS: There is extensive left lower lobe airspace consolidation with effusion on the left. The lungs elsewhere clear. Heart size and pulmonary vascularity are normal. No adenopathy. Patient is status post total shoulder replacements bilaterally. There is marked collapse of a lower thoracic vertebral body, stable. IMPRESSION: Left lower lobe airspace consolidation consistent with pneumonia. Left pleural effusion. Lungs elsewhere clear. Cardiac silhouette within normal limits. Followup PA and lateral chest radiographs recommended in 3-4 weeks following trial of antibiotic therapy to ensure resolution and exclude underlying malignancy. Electronically Signed   By: Bretta BangWilliam  Woodruff III M.D.   On: 09/30/2015 13:35    EKG: Independently reviewed.  Assessment/Plan Principal Problem:   CAP (community acquired pneumonia) Active Problems:   Cirrhosis of liver with ascites (HCC)   Left lower lobe pneumonia   1. CAP - 1. PNA pathway 2. Rocephin and azithromycin 3. Cultures pending 2. Ascites - 1. Needs paracentesis 2. Ordered IR paracentesis with cultures and cell studies, not all studies ordered since this is very likely related to his known EtOH  cirrhosis.    Code Status: Full  Family Communication: No family in room Disposition Plan: Admit to inpatient   Time spent: 70 min  GARDNER, JARED M. [facility] Pager [phone]  If 7AM-7PM, please contact the day team taking care of the patient [email or web address] Password [email or web address] 09/30/2015, 6:36 PM

## 2015-09-30 NOTE — ED Notes (Signed)
Dr. Julian ReilGardner notified regarding no blood cultures before antibiotics started.

## 2015-09-30 NOTE — ED Notes (Signed)
20 min timer called and nurse said she would call back but pt should go to floor at 19;03

## 2015-09-30 NOTE — ED Provider Notes (Signed)
CSN: [id number]     Arrival date & time 09/30/15  1307 History   First MD Initiated Contact with Patient 09/30/15 1643     Chief Complaint  Patient presents with  . Abdominal Pain  . Shortness of Breath  . Dizziness     (Consider location/radiation/quality/duration/timing/severity/associated sxs/prior Treatment) HPI Comments: Patient with history of cirrhosis, has developed increasing abdominal fluid retention since around Thanksgiving.  Increasing abdominal pain with the increase in distention.    Patient is a 60 y.o. male presenting with abdominal pain, shortness of breath, and dizziness. The history is provided by the patient and medical records. No language interpreter was used.  Abdominal Pain Pain location:  Generalized Pain quality: fullness and pressure   Pain severity:  Moderate Onset quality:  Gradual Duration:  5 weeks Progression:  Worsening Context: alcohol use   Associated symptoms: cough and shortness of breath   Associated symptoms: no fever   Shortness of breath:    Severity:  Moderate   Onset quality:  Gradual   Duration:  1 week   Timing:  Constant   Progression:  Worsening Risk factors: alcohol abuse   Shortness of Breath Associated symptoms: abdominal pain and cough   Associated symptoms: no fever   Dizziness Associated symptoms: shortness of breath     Past Medical History  Diagnosis Date  . Hypertension     takes Lisinopril/HCTZ daily  . Birthmark of skin     "right eye" (01/30/2013)  . GERD (gastroesophageal reflux disease)     seldom  . Arthritis   . Cirrhosis (HCC)   . Abdominal hernia    Past Surgical History  Procedure Laterality Date  . 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 Colonoscopy  ~ 2009  . Total shoulder arthroplasty Left 01/30/2013    Procedure: TOTAL SHOULDER ARTHROPLASTY;  Surgeon: Mable Paris, MD;  Location: [facility] OR;  Service: Orthopedics;  Laterality: Left;  . Total shoulder arthroplasty Right 05/24/2013   Dr Ave Filter  . Total shoulder arthroplasty Right 05/24/2013    Procedure: RIGHT TOTAL SHOULDER ARTHROPLASTY;  Surgeon: Mable Paris, MD;  Location: [facility] OR;  Service: Orthopedics;  Laterality: Right;  Right total shoulder replacement   Family History  Problem Relation Age of Onset  . Colon cancer Neg Hx   . Colon polyps Neg Hx   . Kidney disease Neg Hx   . Diabetes Neg Hx   . Gallbladder disease Mother   . Esophageal cancer Neg Hx   . Heart disease Neg Hx    Social History  Substance Use Topics  . Smoking status: Never Smoker   . Smokeless tobacco: Never Used  . Alcohol Use: 14.4 oz/week    24 Cans of beer per week     Comment: daily beer    Review of Systems  Constitutional: Negative for fever.  Respiratory: Positive for cough and shortness of breath.   Gastrointestinal: Positive for abdominal pain.  Neurological: Positive for dizziness.  All other systems reviewed and are negative.     Allergies  Tramadol  Home Medications   Prior to Admission medications   Medication Sig Start Date End Date Taking? Authorizing Provider  furosemide (LASIX) 20 MG tablet Take 1 tablet (20 mg total) by mouth daily. 01/05/15  Yes Purvis Sheffield, MD  hydrOXYzine (VISTARIL) 25 MG capsule TAKE 1 TO 2 CAPSULES BY MOUTH AT BEDTIME (MAY CAUSE DROWSINESS) 08/20/15  Yes Historical Provider, MD  Naproxen Sod-Diphenhydramine (ALEVE PM PO) Take 2 tablets by mouth  at bedtime as needed (sleep).    Historical Provider, MD  oxyCODONE (ROXICODONE) 5 MG immediate release tablet Take 1 tablet (5 mg total) by mouth every 6 (six) hours as needed for moderate pain or severe pain. Patient not taking: Reported on 09/30/2015 01/05/15   Purvis SheffieldForrest Harrison, MD   BP 122/93 mmHg  Pulse 111  Temp(Src) 97.9 F (36.6 C) (Oral)  Resp 18  SpO2 98% Physical Exam  Constitutional: He is oriented to person, place, and time.  Eyes: EOM are normal.  Neck: Neck supple.  Cardiovascular: Intact distal pulses.   Tachycardia present.   Pulmonary/Chest: He is in respiratory distress. He has decreased breath sounds in the left upper field, the left middle field and the left lower field.  Abdominal: He exhibits distension. There is tenderness.  Musculoskeletal: He exhibits no edema or tenderness.  Lymphadenopathy:    He has cervical adenopathy.  Neurological: He is alert and oriented to person, place, and time.  Skin: Skin is warm and dry.  Psychiatric: He has a normal mood and affect.  Nursing note and vitals reviewed.   ED Course  Procedures (including critical care time) Labs Review Labs Reviewed  COMPREHENSIVE METABOLIC PANEL - Abnormal; Notable for the following:    Sodium 130 (*)    CO2 21 (*)    Glucose, Bld 107 (*)    Calcium 8.4 (*)    Albumin 2.9 (*)    Alkaline Phosphatase 140 (*)    Total Bilirubin 2.5 (*)    All other components within normal limits  CBC - Abnormal; Notable for the following:    WBC 3.9 (*)    RBC 4.12 (*)    Hemoglobin 9.8 (*)    HCT 30.9 (*)    MCV 75.0 (*)    MCH 23.8 (*)    RDW 17.7 (*)    All other components within normal limits  LIPASE, BLOOD  URINALYSIS, ROUTINE W REFLEX MICROSCOPIC (NOT [facility])    Imaging Review Dg Chest 2 View  09/30/2015  CLINICAL DATA:  Chest pain and shortness of breath for 2 days. EXAM: CHEST  2 VIEW COMPARISON:  January 04, 2015 FINDINGS: There is extensive left lower lobe airspace consolidation with effusion on the left. The lungs elsewhere clear. Heart size and pulmonary vascularity are normal. No adenopathy. Patient is status post total shoulder replacements bilaterally. There is marked collapse of a lower thoracic vertebral body, stable. IMPRESSION: Left lower lobe airspace consolidation consistent with pneumonia. Left pleural effusion. Lungs elsewhere clear. Cardiac silhouette within normal limits. Followup PA and lateral chest radiographs recommended in 3-4 weeks following trial of antibiotic therapy to ensure resolution and  exclude underlying malignancy. Electronically Signed   By: Bretta BangWilliam  Woodruff III M.D.   On: 09/30/2015 13:35   I have personally reviewed and evaluated these images and lab results as part of my medical decision-making.   EKG Interpretation None     Patient with increasing abdominal distention and pain since around Thanksgiving.  History of cirrhosis. Shortness of breath.  CXR reveals left airspace consolidation and pleural effusion.  Afebrile. Not hypotensive. Tachycardia at 110. CAP antibiotics initiated in ED.   Lab and radiology results reviewed, shared with patient. Discussed with Dr. Deretha EmoryZackowski. Will request admission to medicine. MDM   Final diagnoses:  None    Community acquired pneumonia. Ascites.    Felicie Mornav[place] Gan, NP 10/01/15 [phone]

## 2015-09-30 NOTE — ED Provider Notes (Signed)
Medical screening examination/treatment/procedure(s) were conducted as a shared visit with non-physician practitioner(s) and myself.  I personally evaluated the patient during the encounter.   EKG Interpretation None      Results for orders placed or performed during the hospital encounter of 09/30/15  Lipase, blood  Result Value Ref Range   Lipase 30 11 - 51 U/L  Comprehensive metabolic panel  Result Value Ref Range   Sodium 130 (L) 135 - 145 mmol/L   Potassium 4.0 3.5 - 5.1 mmol/L   Chloride 102 101 - 111 mmol/L   CO2 21 (L) 22 - 32 mmol/L   Glucose, Bld 107 (H) 65 - 99 mg/dL   BUN 9 6 - 20 mg/dL   Creatinine, Ser 1.610.62 0.61 - 1.24 mg/dL   Calcium 8.4 (L) 8.9 - 10.3 mg/dL   Total Protein 7.4 6.5 - 8.1 g/dL   Albumin 2.9 (L) 3.5 - 5.0 g/dL   AST 41 15 - 41 U/L   ALT 29 17 - 63 U/L   Alkaline Phosphatase 140 (H) 38 - 126 U/L   Total Bilirubin 2.5 (H) 0.3 - 1.2 mg/dL   GFR calc non Af Amer >60 >60 mL/min   GFR calc Af Amer >60 >60 mL/min   Anion gap 7 5 - 15  CBC  Result Value Ref Range   WBC 3.9 (L) 4.0 - 10.5 K/uL   RBC 4.12 (L) 4.22 - 5.81 MIL/uL   Hemoglobin 9.8 (L) 13.0 - 17.0 g/dL   HCT 09.630.9 (L) 04.539.0 - 40.952.0 %   MCV 75.0 (L) 78.0 - 100.0 fL   MCH 23.8 (L) 26.0 - 34.0 pg   MCHC 31.7 30.0 - 36.0 g/dL   RDW 81.117.7 (H) 91.411.5 - 78.215.5 %   Platelets 258 150 - 400 K/uL   Dg Chest 2 View  09/30/2015  CLINICAL DATA:  Chest pain and shortness of breath for 2 days. EXAM: CHEST  2 VIEW COMPARISON:  January 04, 2015 FINDINGS: There is extensive left lower lobe airspace consolidation with effusion on the left. The lungs elsewhere clear. Heart size and pulmonary vascularity are normal. No adenopathy. Patient is status post total shoulder replacements bilaterally. There is marked collapse of a lower thoracic vertebral body, stable. IMPRESSION: Left lower lobe airspace consolidation consistent with pneumonia. Left pleural effusion. Lungs elsewhere clear. Cardiac silhouette within normal limits.  Followup PA and lateral chest radiographs recommended in 3-4 weeks following trial of antibiotic therapy to ensure resolution and exclude underlying malignancy. Electronically Signed   By: Bretta BangWilliam  Woodruff III M.D.   On: 09/30/2015 13:35   Patient chest x-ray consistent with a left-sided pneumonia. Patient will require admission for this. Patient in addition has significant ascites and would probably benefit from having that drained during the hospitalization. Patient has had the ascites drained in the past about a year ago. Patient oxygen saturations are fine. No significant hypoxia but patient has significant shortness of breath. Patient started on community-acquired pneumonia antibiotics appropriately. Will require admission.  On exam patient's alert and cooperative and follows commands. Decreased breath sounds on the left side. Slightly tachycardic. Oxygen saturation was 97%.  Vanetta MuldersScott Leanna Hamid, MD 09/30/15 [phone]

## 2015-09-30 NOTE — ED Notes (Signed)
20 min timer called to nurse but she will call back , pt should go up at 19:02

## 2015-09-30 NOTE — ED Notes (Signed)
UNABLE TO COLLECT LABS AT THIS TIME PATIENT IS IN XRAY 

## 2015-09-30 NOTE — ED Notes (Signed)
Pt given meal tray.

## 2015-09-30 NOTE — ED Notes (Addendum)
Per EMS, coming from home, hx of cirrhosis, abd appears distended, complaining of pain from that as well as SOB. Pain 6/10, also complaining of dizziness, pt appears pale in triage. States his abdomin was last drained sometime last May

## 2015-09-30 NOTE — ED Notes (Signed)
Bed: [id number] Expected date:  Expected time:  Means of arrival:  Comments: Pt in rm

## 2015-09-30 NOTE — Progress Notes (Signed)
Patient initially listed as not having insurance.  EDCM spoke to patient at bedside.  Patient reports he has [employer] insurance.  Patient reports he is no longer seeing Dr. Lillia MountainWilkins, and does nto want to go back to the Urgent Care on [address].  [facility] who accept Medicare insurance within a 5 mile radius of patient's zip code [phone].  Patient thankful for resources.  No further EDCM need at this time.

## 2015-10-01 ENCOUNTER — Inpatient Hospital Stay (HOSPITAL_COMMUNITY): Payer: Medicare Other

## 2015-10-01 DIAGNOSIS — E43 Unspecified severe protein-calorie malnutrition: Secondary | ICD-10-CM | POA: Diagnosis present

## 2015-10-01 DIAGNOSIS — J189 Pneumonia, unspecified organism: Secondary | ICD-10-CM | POA: Diagnosis not present

## 2015-10-01 LAB — URINALYSIS, ROUTINE W REFLEX MICROSCOPIC
Bilirubin Urine: NEGATIVE
Glucose, UA: NEGATIVE mg/dL
HGB URINE DIPSTICK: NEGATIVE
Ketones, ur: NEGATIVE mg/dL
LEUKOCYTES UA: NEGATIVE
NITRITE: NEGATIVE
PROTEIN: NEGATIVE mg/dL
SPECIFIC GRAVITY, URINE: 1.011 (ref 1.005–1.030)
pH: 6 (ref 5.0–8.0)

## 2015-10-01 LAB — BODY FLUID CELL COUNT WITH DIFFERENTIAL
EOS FL: 0 %
LYMPHS FL: 44 %
MONOCYTE-MACROPHAGE-SEROUS FLUID: 55 % (ref 50–90)
NEUTROPHIL FLUID: 1 % (ref 0–25)
WBC FLUID: 76 uL (ref 0–1000)

## 2015-10-01 LAB — GRAM STAIN

## 2015-10-01 LAB — PATHOLOGIST SMEAR REVIEW

## 2015-10-01 LAB — STREP PNEUMONIAE URINARY ANTIGEN: STREP PNEUMO URINARY ANTIGEN: NEGATIVE

## 2015-10-01 LAB — HIV ANTIBODY (ROUTINE TESTING W REFLEX): HIV Screen 4th Generation wRfx: NONREACTIVE

## 2015-10-01 MED ORDER — PANTOPRAZOLE SODIUM 40 MG PO TBEC
40.0000 mg | DELAYED_RELEASE_TABLET | Freq: Every day | ORAL | Status: DC
  Administered 2015-10-01 – 2015-10-02 (×2): 40 mg via ORAL
  Filled 2015-10-01 (×2): qty 1

## 2015-10-01 MED ORDER — VITAMIN B-1 100 MG PO TABS
100.0000 mg | ORAL_TABLET | Freq: Every day | ORAL | Status: DC
  Administered 2015-10-01 – 2015-10-03 (×3): 100 mg via ORAL
  Filled 2015-10-01 (×3): qty 1

## 2015-10-01 MED ORDER — ENSURE ENLIVE PO LIQD
237.0000 mL | ORAL | Status: DC
  Administered 2015-10-01 – 2015-10-02 (×2): 237 mL via ORAL

## 2015-10-01 MED ORDER — ALBUMIN HUMAN 25 % IV SOLN
12.5000 g | Freq: Once | INTRAVENOUS | Status: AC
  Administered 2015-10-01: 12.5 g via INTRAVENOUS
  Filled 2015-10-01: qty 50

## 2015-10-01 MED ORDER — FOLIC ACID 1 MG PO TABS
1.0000 mg | ORAL_TABLET | Freq: Every day | ORAL | Status: DC
  Administered 2015-10-01 – 2015-10-03 (×3): 1 mg via ORAL
  Filled 2015-10-01 (×3): qty 1

## 2015-10-01 MED ORDER — SPIRONOLACTONE 25 MG PO TABS
25.0000 mg | ORAL_TABLET | Freq: Every day | ORAL | Status: DC
  Administered 2015-10-01 – 2015-10-02 (×2): 25 mg via ORAL
  Filled 2015-10-01 (×2): qty 1

## 2015-10-01 NOTE — Procedures (Signed)
Ultrasound-guided diagnostic and therapeutic paracentesis performed yielding 10.6 L turbid, yellow fluid. A portion of the fluid was submitted to the lab for preordered studies. No immediate complications.

## 2015-10-01 NOTE — Progress Notes (Signed)
Initial Nutrition Assessment  DOCUMENTATION CODES:   Severe malnutrition in context of chronic illness, Obesity unspecified  INTERVENTION:  - Will order Ensure Enlive once/day, this supplement provides 350 kcal and 20 grams of protein - Continue to educate on diet related to cirrhosis as needed - RD will continue to monitor for needs  NUTRITION DIAGNOSIS:   Unintentional weight loss related to poor appetite as evidenced by per patient/family report  GOAL:   Patient will meet greater than or equal to 90% of their needs  MONITOR:   PO intake, Supplement acceptance, Weight trends, Labs, Skin, I & O's  REASON FOR ASSESSMENT:   Malnutrition Screening Tool  ASSESSMENT:   60 y.o. male h/o EtOH cirrhosis of the liver with ascites last paracentesis was in April of last year. Patient presents to the ED with c/o 5 weeks worsening abdominal distention and 1 week history of worsening SOB, cough. No fever or chills. Nothing makes symptoms better or worse.  Pt seen for MST. BMI indicates obesity. Pt sates that for breakfast he ate nearly 100% of eggs, pancakes, fruit cup, and drank ginger ale. Pt states he experienced abdominal pressure after this meal. He states that following breakfast he had a paracentesis (notes indicate last paracentesis was in April 2016) and is hopeful that abdominal pressure will decrease with subsequent meals.  PTA pt had poor appetite and was experiencing abdominal pressure but no nausea with PTA intakes. He denies chewing or swallowing issues and denies ever being told that he has/has had esophageal varices. He states that he has a friend who also has cirrhosis who gives him tips about diet. Pt reports that an MD previously advised him to only eat fruit and salad without dressing but it was very difficult for him to follow this. Pt requested information about diet. Provided him with Cirrhosis Nutrition Therapy handout from the [employer] of [employer] and  went over this with pt; encouraged decreased salt intake and talked about alternative seasoning options and also encouraged him to eat smaller, frequent meals. Also encouraged pt to aim for 90 grams of protein daily. Pt asked appropriate questions concerning specific foods and seasoning options throughout discussion. He states that he previously used salt heavily.   Per review, pt has lost 31 lbs (12% body weight) in the past 10 months which is not significant for time frame; this weight difference was prior to paracentesis this AM and is likely not fully reflective of weight loss. Severe muscle and fat wasting noted in addition to moderate muscle wasting.   Will order Ensure once/day to help pt reach protein goals. Medications reviewed. Labs reviewed; Na: 130 mmol/L, Ca: 8.4 mmol/L, Alk Phos elevated.   Diet Order:  Diet 2 gram sodium Room service appropriate?: Yes; Fluid consistency:: Thin  Skin:  Reviewed, no issues  Last BM:  1/3  Height:   Ht Readings from Last 1 Encounters:  09/30/15 '5\' 11"'  (1.803 m)    Weight:   Wt Readings from Last 1 Encounters:  09/30/15 218 lb 7.6 oz (99.1 kg)    Ideal Body Weight:  78.18 kg (kg)  BMI:  Body mass index is 30.48 kg/(m^2).  Estimated Nutritional Needs:   Kcal:  2000-2200  Protein:  90-100 grams  Fluid:  1.2-1.5 L/day  EDUCATION NEEDS:   Education needs addressed     Jarome Matin, RD, LDN Inpatient Clinical Dietitian Pager # [phone] After hours/weekend pager # [phone]

## 2015-10-01 NOTE — Progress Notes (Signed)
[facility] PROGRESS NOTE  [name] UJW:[id number] DOB: 05/27/1956 DOA: 09/30/2015 PCP: Kaleen Mask, MD  Summary I have seen and examined Devin Noble at bedside and reviewed his chart. Devin Noble is a pleasant 60 y.o. 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 Left pleural effusion. Lungs elsewhere clear. Cardiac silhouette within normal limits. Followup PA and lateral chest radiographs recommended in 3-4 weeks following trial of antibiotic therapy ensure resolution and exclude underlying malignancy") and ascites, now status post interventional radiology guided paracentesis 10.6L drained. Appreciate IR help. Patient is on antibiotics and diuretics. Will give folic acid/thiamine/abdomen/spironolactone/Protonix and consider beta blocker. He will continue antibiotics for pneumonia to complete course. He needs follow-up with GI. He also has an umbilical hernia which can be addressed once ascites is under control. Plan CAP (community acquired pneumonia)/ Left lower lobe pneumonia  Day 2 Ceftriaxone/Zithromax Cirrhosis of liver with ascites (HCC)/severe protein calorie malnutrition  Status post paracentesis. Ascitic fluid analysis unremarkable so far.  Follow ascitic fluid results  Folic acid/thiamine/Albumin/Spironolactone/Protonix-?beta blocker  Continue Lasix  Patient needs to be set up with GI-will consult GI on call Code Status: Full Code Family Communication: Spoke with patient Disposition Plan: Home eventually.   Consultants:  None  Procedures:  Paracentesis  Antibiotics:  Ceftriaxone 09/30/2015>  Zithromax 09/30/2015>  HPI/Subjective: Patient says he feels a lot of relief after excess  fluid removed. He is worried about periumbilical hernia.  Objective: Filed Vitals:   10/01/15 1032 10/01/15 1040  BP: 98/67 103/70  Pulse:    Temp:    Resp:      Intake/Output Summary (Last 24 hours) at 10/01/15 1244 Last data filed at 09/30/15 2132  Gross per 24 hour  Intake    120 ml  Output      0 ml  Net    120 ml   Filed Weights   09/30/15 1937  Weight: 99.1 kg (218 lb 7.6 oz)    Exam:   General:  Comfortable at rest.  Cardiovascular: S1-S2 normal. No murmurs. Pulse regular.  Respiratory: Good air entry bilaterally. No rhonchi or rales.  Abdomen: Soft and nontender. Normal bowel sounds. No organomegaly. Ascites.  Musculoskeletal: No pedal edema   Neurological: Intact  Data Reviewed: Basic Metabolic Panel:  Recent Labs Lab 09/30/15 1350  NA 130*  K 4.0  CL 102  CO2 21*  GLUCOSE 107*  BUN 9  CREATININE 0.62  CALCIUM 8.4*   Liver Function Tests:  Recent Labs Lab 09/30/15 1350  AST 41  ALT 29  ALKPHOS 140*  BILITOT 2.5*  PROT 7.4  ALBUMIN 2.9*    Recent Labs Lab 09/30/15 1350  LIPASE 30   No results for input(s): AMMONIA in the last 168 hours. CBC:  Recent Labs Lab 09/30/15 1350  WBC 3.9*  HGB 9.8*  HCT 30.9*  MCV 75.0*  PLT 258   Cardiac Enzymes: No results for input(s): CKTOTAL, CKMB, CKMBINDEX, TROPONINI in the last 168 hours. BNP (last 3 results)  Recent Labs  01/04/15 2026  BNP 45.6    ProBNP (last 3 results) No results for input(s): PROBNP in the last 8760 hours.  CBG: No results for input(s): GLUCAP in the last 168 hours.  No results found for this or any previous  visit (from the past 240 hour(s)).   Studies: Dg Chest 2 View  09/30/2015  CLINICAL DATA:  Chest pain and shortness of breath for 2 days. EXAM: CHEST  2 VIEW COMPARISON:  January 04, 2015 FINDINGS: There is extensive left lower lobe airspace consolidation with effusion on the left. The lungs elsewhere clear. Heart size and pulmonary vascularity are  normal. No adenopathy. Patient is status post total shoulder replacements bilaterally. There is marked collapse of a lower thoracic vertebral body, stable. IMPRESSION: Left lower lobe airspace consolidation consistent with pneumonia. Left pleural effusion. Lungs elsewhere clear. Cardiac silhouette within normal limits. Followup PA and lateral chest radiographs recommended in 3-4 weeks following trial of antibiotic therapy to ensure resolution and exclude underlying malignancy. Electronically Signed   By: Bretta BangWilliam  Woodruff III M.D.   On: 09/30/2015 13:35    Scheduled Meds: . azithromycin  500 mg Oral Q24H  . cefTRIAXone (ROCEPHIN)  IV  1 g Intravenous Q24H  . furosemide  20 mg Oral Daily  . heparin  5,000 Units Subcutaneous 3 times per day  . hydrOXYzine  25-50 mg Oral QHS   Continuous Infusions:    Time spent: 25 minutes    Ileane Sando  [facility] Pager [phone]. If 7PM-7AM, please contact night-coverage at [email or web address], password [email or web address] 10/01/2015, 12:44 PM  LOS: 1 day

## 2015-10-02 ENCOUNTER — Encounter (HOSPITAL_COMMUNITY): Payer: Self-pay | Admitting: Physician Assistant

## 2015-10-02 DIAGNOSIS — D500 Iron deficiency anemia secondary to blood loss (chronic): Secondary | ICD-10-CM | POA: Diagnosis present

## 2015-10-02 DIAGNOSIS — K7031 Alcoholic cirrhosis of liver with ascites: Secondary | ICD-10-CM

## 2015-10-02 DIAGNOSIS — J189 Pneumonia, unspecified organism: Principal | ICD-10-CM

## 2015-10-02 LAB — PROTIME-INR
INR: 1.65 — ABNORMAL HIGH (ref 0.00–1.49)
PROTHROMBIN TIME: 19.5 s — AB (ref 11.6–15.2)

## 2015-10-02 LAB — COMPREHENSIVE METABOLIC PANEL
ALT: 22 U/L (ref 17–63)
AST: 29 U/L (ref 15–41)
Albumin: 2.3 g/dL — ABNORMAL LOW (ref 3.5–5.0)
Alkaline Phosphatase: 107 U/L (ref 38–126)
Anion gap: 5 (ref 5–15)
BUN: 8 mg/dL (ref 6–20)
CHLORIDE: 103 mmol/L (ref 101–111)
CO2: 24 mmol/L (ref 22–32)
CREATININE: 0.56 mg/dL — AB (ref 0.61–1.24)
Calcium: 8.2 mg/dL — ABNORMAL LOW (ref 8.9–10.3)
GFR calc non Af Amer: >60 mL/min (ref >60)
Glucose, Bld: 91 mg/dL (ref 65–99)
POTASSIUM: 3.9 mmol/L (ref 3.5–5.1)
SODIUM: 132 mmol/L — AB (ref 135–145)
Total Bilirubin: 1.6 mg/dL — ABNORMAL HIGH (ref 0.3–1.2)
Total Protein: 5.6 g/dL — ABNORMAL LOW (ref 6.5–8.1)

## 2015-10-02 LAB — CBC
HCT: 23.9 % — ABNORMAL LOW (ref 39.0–52.0)
Hemoglobin: 7.6 g/dL — ABNORMAL LOW (ref 13.0–17.0)
MCH: 23.7 pg — ABNORMAL LOW (ref 26.0–34.0)
MCHC: 31.8 g/dL (ref 30.0–36.0)
MCV: 74.5 fL — ABNORMAL LOW (ref 78.0–100.0)
PLATELETS: 146 10*3/uL — AB (ref 150–400)
RBC: 3.21 MIL/uL — AB (ref 4.22–5.81)
RDW: 17.6 % — AB (ref 11.5–15.5)
WBC: 2.9 10*3/uL — AB (ref 4.0–10.5)

## 2015-10-02 LAB — PREPARE RBC (CROSSMATCH)

## 2015-10-02 LAB — IRON AND TIBC
Iron: 11 ug/dL — ABNORMAL LOW (ref 45–182)
SATURATION RATIOS: 4 % — AB (ref 17.9–39.5)
TIBC: 295 ug/dL (ref 250–450)
UIBC: 284 ug/dL

## 2015-10-02 LAB — FERRITIN: Ferritin: 11 ng/mL — ABNORMAL LOW (ref 24–336)

## 2015-10-02 LAB — APTT: aPTT: 43 seconds — ABNORMAL HIGH (ref 24–37)

## 2015-10-02 LAB — ABO/RH: ABO/RH(D): B POS

## 2015-10-02 LAB — TSH: TSH: 1.186 u[IU]/mL (ref 0.350–4.500)

## 2015-10-02 MED ORDER — PANTOPRAZOLE SODIUM 40 MG PO TBEC
40.0000 mg | DELAYED_RELEASE_TABLET | Freq: Every day | ORAL | Status: DC
Start: 2015-10-02 — End: 2015-10-03
  Administered 2015-10-02 – 2015-10-03 (×2): 40 mg via ORAL
  Filled 2015-10-02 (×2): qty 1

## 2015-10-02 MED ORDER — FUROSEMIDE 10 MG/ML IJ SOLN: 20.0000 mg | Freq: Once | INTRAMUSCULAR | Status: DC

## 2015-10-02 MED ORDER — FUROSEMIDE 40 MG PO TABS
40.0000 mg | ORAL_TABLET | Freq: Every day | ORAL | Status: DC
  Administered 2015-10-03: 40 mg via ORAL
  Filled 2015-10-02: qty 1

## 2015-10-02 MED ORDER — PANTOPRAZOLE SODIUM 40 MG PO TBEC: 40.0000 mg | DELAYED_RELEASE_TABLET | Freq: Two times a day (BID) | ORAL | Status: DC

## 2015-10-02 MED ORDER — SODIUM CHLORIDE 0.9 % IV SOLN
Freq: Once | INTRAVENOUS | Status: AC
  Administered 2015-10-02: 19:00:00 via INTRAVENOUS

## 2015-10-02 MED ORDER — SPIRONOLACTONE 25 MG PO TABS
100.0000 mg | ORAL_TABLET | Freq: Every day | ORAL | Status: DC
  Administered 2015-10-03: 100 mg via ORAL
  Filled 2015-10-02: qty 4

## 2015-10-02 MED ORDER — FUROSEMIDE 40 MG PO TABS: 40.0000 mg | ORAL_TABLET | Freq: Every day | ORAL | Status: DC

## 2015-10-02 NOTE — Consult Note (Signed)
[facility] Consult: 3:08 PM 10/02/2015  LOS: 2 days    Referring Provider: Dr Venetia Constable  Primary Care Physician:  Kaleen Mask, MD Primary Gastroenterologist:  Dr. Elnoria Howard, pt does not want to see him     Reason for Consultation:  Management of  Cirrhosis, ascites, microcytic anemia.     HPI: Devin Noble is a 60 y.o. male.   Hx  DJD, arthritis and bilateral shoulder replacements.  ETOH cirrhosis and EtOH hepatitis dating back to at least August 2 014.  Hepatitis serologies negative.  Coagulopathy per lab work from 01/04/2015.Marland Kitchen    Previous paracentesis, last was in 12/2014.  On Lasix 20 mg daily.  Normocytic anemia with Hgb 10.5 in 11/2014.    Seen 11/2014 by Rondall Allegra but never returned for follow up.  Plan was for colonoscopy as well as upper endoscopy with Dr. Dickie La.  Patient was dismayed by out-of-pocket cost of procedures as well as the cost of lab work which had been performed by the urgent care center where he first presented. He ended up seeing Dr. Jeani Hawking around May 2016 but never returned to his office. 12/2014 CT showed portal hypertensive changes of esoph varices, splenomegaly and marked ascites.  On 01/05/15 he underwent a 1.7 L paracentesis.  Fluid studies were negative for SBP. Patient has been compliant with taking furosemide 20 mg daily. He also has been periodically taking NSAIDs but stopped these a few months ago. He stopped using BC powders when necessary a couple of weeks ago. He stopped drinking beer in mid-to-late October 2016.  Admitted 2 days ago with CAP, abdominal distention and fluid retention.  + SOB, dizziness. The swelling and dizziness has been going on for at least a month. He has taken at least a couple of falls at home but has not sustained any serious injuries or significant  hematomas in the process. He denies syncope, chest pain, palpitations. He has had lower extremity edema. 10.6 liter paracentesis today. Studies neg for SBP.   Medical management so far includes antibiotics for the pneumonia, ensure supplements for protein malnutrition, IV as well as oral Lasix, the dose is now 40 mg daily.  Aldactone 25 mg was added along with twice daily Protonix. Labs reveal a microcytic anemia.  2 days ago the hemoglobin was 9.8, today it is 7.6. Platelets have dropped to 146.  WBC count is 2.9. PT/INR are just being collected along with a type and screen did 2 units of packed cells have been ordered for transfusion..  Patient has had colonoscopy around 2009. There is no reported history of polyps.  Past Medical History  Diagnosis Date  . Hypertension     takes Lisinopril/HCTZ daily  . Birthmark of skin     "right eye" (01/30/2013)  . GERD (gastroesophageal reflux disease)     seldom  . Arthritis   . Cirrhosis (HCC)   . Abdominal hernia     Past Surgical History  Procedure Laterality Date  . 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 Colonoscopy  ~ 2009  .  Total shoulder arthroplasty Left 01/30/2013    Procedure: TOTAL SHOULDER ARTHROPLASTY;  Surgeon: Mable Paris, MD;  Location: [facility] OR;  Service: Orthopedics;  Laterality: Left;  . Total shoulder arthroplasty Right 05/24/2013    Dr Ave Filter  . Total shoulder arthroplasty Right 05/24/2013    Procedure: RIGHT TOTAL SHOULDER ARTHROPLASTY;  Surgeon: Mable Paris, MD;  Location: [facility];  Service: Orthopedics;  Laterality: Right;  Right total shoulder replacement    Prior to Admission medications   Medication Sig Start Date End Date Taking? Authorizing Provider  furosemide (LASIX) 20 MG tablet Take 1 tablet (20 mg total) by mouth daily. 01/05/15  Yes Purvis Sheffield, MD  hydrOXYzine (VISTARIL) 25 MG capsule TAKE 1 TO 2 CAPSULES BY MOUTH AT BEDTIME (MAY CAUSE DROWSINESS) 08/20/15  Yes Historical  Provider, MD  Naproxen Sod-Diphenhydramine (ALEVE PM PO) Take 2 tablets by mouth at bedtime as needed (sleep).    Historical Provider, MD    Scheduled Meds: . sodium chloride   Intravenous Once  . azithromycin  500 mg Oral Q24H  . cefTRIAXone (ROCEPHIN)  IV  1 g Intravenous Q24H  . feeding supplement (ENSURE ENLIVE)  237 mL Oral Q24H  . folic acid  1 mg Oral Daily  . furosemide  20 mg Intravenous Once  . furosemide  20 mg Oral Daily  . hydrOXYzine  25-50 mg Oral QHS  . pantoprazole  40 mg Oral BID  . spironolactone  25 mg Oral Daily  . thiamine  100 mg Oral Daily   Infusions:   PRN Meds: morphine injection, ondansetron, oxyCODONE   Allergies as of 09/30/2015 - Review Complete 09/30/2015  Allergen Reaction Noted  . Tramadol Hives 05/24/2013    Family History  Problem Relation Age of Onset  . Colon cancer Neg Hx   . Colon polyps Neg Hx   . Kidney disease Neg Hx   . Diabetes Neg Hx   . Gallbladder disease Mother   . Esophageal cancer Neg Hx   . Heart disease Neg Hx     Social History   Social History  . Marital Status: Single    Spouse Name: N/A  . Number of Children: 0  . Years of Education: N/A   Occupational History  . Diability    Social History Main Topics  . Smoking status: Never Smoker   . Smokeless tobacco: Never Used  . Alcohol Use: 14.4 oz/week    24 Cans of beer per week     Comment: daily beer  . Drug Use: No  . Sexual Activity: Not Currently   Other Topics Concern  . Not on file   Social History Narrative    REVIEW OF SYSTEMS: Constitutional:  Generally weak. Falling at home, dizziness. ENT:  No nose bleeds but has had some bleeding from his gums. Pulm:  Some DOE but no shortness of breath at rest. No cough. Doesn't smoke. CV:  No palpitations.  LE edema improved within the 48 hours of his hospitalization..  GU:  No hematuria, no frequency GI:  No dysphagia. No heartburn. Patient doesn't look at his bowel movement so he doesn't know if  there's been any blood or change in the color or caliber of his stools. Denies diarrhea, denies constipation. Generally has a bowel movement every day. Heme:  Patient unaware of any issues with excessive bleeding or bruising.  Patient has never been prescribed supplements of iron, B12 or folate. Transfusions:  Has never had transfusions. 2 PRBCs now ordered. Neuro:  No headaches, no peripheral tingling or numbness Derm:  Pruritus and lesions on trunk that bleed when he scratches.  Endocrine:  No sweats or chills.  No polyuria or dysuria Immunization:  Had a pneumococcal vaccine in 09/2015. Travel:  None beyond local counties in last few months.    PHYSICAL EXAM: Vital signs in last 24 hours: Filed Vitals:   10/02/15 0456 10/02/15 1300  BP: 109/67 116/78  Pulse: 100 104  Temp: 98.3 F (36.8 C) 98.1 F (36.7 C)  Resp: 18 18   Wt Readings from Last 3 Encounters:  09/30/15 99.1 kg (218 lb 7.6 oz)  12/26/14 112.946 kg (249 lb)  12/13/14 105.507 kg (232 lb 9.6 oz)    General: Unwell-appearing white male with distended abdomen. Currently comfortable and conversant. Head:  No swelling, no facial asymmetry or signs of trauma.  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  Ears:  Slightly hard of hearing.  Nose:  No congestion or discharge. Mouth:  No blood in the mouth, no sores, mucous membranes clear. Neck:  No JVD, no bruits, no thyromegaly or masses Lungs:  No cough. No dyspnea with speech. Breath sounds are clear bilaterally Heart: RRR. No MRG. S1/S2 audible. Abdomen:  Distended but soft. Umbilical hernia and [place] present. No organomegaly appreciated. Not tender. Bowel sounds hypoactive..   Rectal: Deferred.   Musc/Skeltl: No joint swelling, contracture deformities or erythema. Extremities:  No CCE.  Neurologic:  Patient is alert, oriented 3. Verbal response time is slow but accurate. There is fine, bilateral tremor in the hand, question  asterixis. Skin:  Telangiectasias on the trunk.  Open eruptions and punctate sores on stomach and trunk.  Tattoos:  None observed. Nodes:  No cervical or inguinal adenopathy.   Psych:  Cooperative, pleasant, talkative, affect depressed. Not anxious or agitated.  Intake/Output from previous day: 01/04 0701 - 01/05 0700 In: 600 [P.O.:600] Out: -  Intake/Output this shift:    LAB RESULTS:  Recent Labs  09/30/15 1350 10/02/15 0415  WBC 3.9* 2.9*  HGB 9.8* 7.6*  HCT 30.9* 23.9*  PLT 258 146*   BMET Lab Results  Component Value Date   NA 132* 10/02/2015   NA 130* 09/30/2015   NA 132* 01/04/2015   K 3.9 10/02/2015   K 4.0 09/30/2015   K 3.3* 01/04/2015   CL 103 10/02/2015   CL 102 09/30/2015   CL 97 01/04/2015   CO2 24 10/02/2015   CO2 21* 09/30/2015   CO2 23 01/04/2015   GLUCOSE 91 10/02/2015   GLUCOSE 107* 09/30/2015   GLUCOSE 100* 01/04/2015   BUN 8 10/02/2015   BUN 9 09/30/2015   BUN <5* 01/04/2015   CREATININE 0.56* 10/02/2015   CREATININE 0.62 09/30/2015   CREATININE 0.69 01/04/2015   CALCIUM 8.2* 10/02/2015   CALCIUM 8.4* 09/30/2015   CALCIUM 8.0* 01/04/2015   LFT  Recent Labs  09/30/15 1350 10/02/15 0415  PROT 7.4 5.6*  ALBUMIN 2.9* 2.3*  AST 41 29  ALT 29 22  ALKPHOS 140* 107  BILITOT 2.5* 1.6*   PT/INR Lab Results  Component Value Date   INR 1.70* 01/04/2015   INR 1.38 05/16/2013   INR 1.08 01/30/2013   Hepatitis Panel No results for input(s): HEPBSAG, HCVAB, HEPAIGM, HEPBIGM in the last 72 hours. C-Diff No components found for: CDIFF Lipase     Component Value Date/Time   LIPASE 30 09/30/2015 1350    Drugs of Abuse  No results  found for: LABOPIA, COCAINSCRNUR, LABBENZ, AMPHETMU, THCU, LABBARB   RADIOLOGY STUDIES: [place] Paracentesis  10/01/2015  INDICATION: Cirrhosis, ascites. Request is made for diagnostic and therapeutic paracentesis. EXAM: ULTRASOUND-GUIDED DIAGNOSTIC AND THERAPEUTIC PARACENTESIS COMPARISON:  None. MEDICATIONS:  None. COMPLICATIONS: None immediate TECHNIQUE: Informed written consent was obtained from the patient after a discussion of the risks, benefits and alternatives to treatment. A timeout was performed prior to the initiation of the procedure. Initial ultrasound scanning demonstrates a large amount of ascites within the right lower abdominal quadrant. The right lower abdomen was prepped and draped in the usual sterile fashion. 1% lidocaine with epinephrine was used for local anesthesia. Under direct ultrasound guidance, a 19 gauge, 7-cm, Yueh catheter was introduced. An ultrasound image was saved for documentation purposed. The paracentesis was performed. The catheter was removed and a dressing was applied. The patient tolerated the procedure well without immediate post procedural complication. FINDINGS: A total of approximately 10.6 liters of turbid, yellow fluid was removed. Samples were sent to the laboratory as requested by the clinical team. IMPRESSION: Successful ultrasound-guided diagnostic and therapeutic paracentesis yielding 10.6 liters of peritoneal fluid. Read by: Jeananne RamaKevin Allred, PA-C Electronically Signed   By: Simonne ComeJohn  Watts M.D.   On: 10/01/2015 12:52    ENDOSCOPIC STUDIES: Previous colonoscopy in [place] around 2009.  IMPRESSION:   *  Ascites, s/p nearly 11 liter tap yesterday and 1/7 liter tap in 12/2014.  Both times fluid studies neg for SBP.  *  ETOH cirrhosis.  Last admitted alcohol consumption was beer in mid to late 06/2015.  Transaminases normal.  Child's C in 12/2014.  Waiting on pending coags to rescore.    *  CAP.  On abx.    *  Hyponatremia.   *  Microcytic anemia.   *  Thrombocytopenia.   *  Coagulopathy 12/2014, repeat coags pending.    *  Protein malnutrition.    PLAN:     *  Needs EGD  prior to discharge.  ?Timing.    *  Up dose the diuretics.  To Lasix 40 and aldactone 100 mg.  Free water limit to 2 liters. AM chemistries .  *  Transfuse as ordered.      Jennye MoccasinSarah Gribbin  10/02/2015, 3:08 PM Pager: [phone]  GI ATTENDING  History, laboratories, x-rays reviewed. Patient seen and examined. Agree with comprehensive consultation note as outlined. Patient with advanced hepatic cirrhosis with portal hypertension. Presents with symptomatic ascites. Now status post paracentesis. Also acute pneumonia. Has chronic microcytic anemia. Agree with previously performed paracentesis. No SBP. We will adjust diuretics to include Lasix 40 mg daily and Aldactone 100 mg daily. 2 g sodium diet recommended. Also, free water restriction 2000 mL per day given hyponatremia. Continue to treat pneumonia as you are. Monitor daily weights as well as renal function and electrolytes. No immediate plans for colonoscopy and upper endoscopy, though would be helpful at some point when his clinical status is optimized.  Wilhemina BonitoJohn N. Eda KeysPerry, Jr., M.D. [facility]

## 2015-10-02 NOTE — Progress Notes (Signed)
[facility] PROGRESS NOTE  Devin Noble ZOX:[id number] DOB: 1955/10/26 DOA: 09/30/2015 PCP: Kaleen Mask, MD  Summary 10/01/15: I have seen and examined Devin Noble at bedside and reviewed his chart. Devin Noble is a pleasant 60 y.o. 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 Left pleural effusion. Lungs elsewhere clear. Cardiac silhouette within normal limits. Followup PA and lateral chest radiographs recommended in 3-4 weeks following trial of antibiotic therapy ensure resolution and exclude underlying malignancy") and ascites, now status post interventional radiology guided paracentesis 10.6L drained. Appreciate IR help. Patient is on antibiotics and diuretics. Will give folic acid/thiamine/abdomen/spironolactone/Protonix and consider beta blocker. He will continue antibiotics for pneumonia to complete course. He needs follow-up with GI. He also has an umbilical hernia which can be addressed once ascites is under control. 10/02/15: Hemoglobin has dropped to 7.6 g/dL. Patient microcytic therefore likely losing blood gradually-? Variceal bleeding. He hasn't noticed any blood in stool. Will obtain iron studies, discontinue heparin/naproxen, transfuse 2 units PRBC and consult GI for liver cirrhosis/microcytic anemia. Meanwhile, will increase Protonix frequency to twice a day. Will also increase dose of Lasix and obtain stool Hemoccult. Plan CAP (community acquired pneumonia)/ Left lower lobe pneumonia  Day 3 Ceftriaxone  Discontinue Zithromax Cirrhosis of liver with ascites (HCC)/severe protein calorie malnutrition/microcytic anemia  Change Protonix to 40 mg twice a day, Lasix to 40 mg daily  Discontinue  heparin/naproxen  SCDs  Iron studies/stool Hemoccult  Transfuse 2 units PRBC  Check ammonia level  Consult GI Code Status: Full Code Family Communication: Spoke with patient at bedside Disposition Plan: Home eventually.   Consultants:  Gastroenterology  Interventional radiology  Procedures:  Paracentesis  Antibiotics:  Ceftriaxone 09/30/2015>  Zithromax 09/30/2015> 10/02/2015  HPI/Subjective: History feels like his belly is filling up again.  Objective: Filed Vitals:   10/02/15 0456 10/02/15 1300  BP: 109/67 116/78  Pulse: 100 104  Temp: 98.3 F (36.8 C) 98.1 F (36.7 C)  Resp: 18 18    Intake/Output Summary (Last 24 hours) at 10/02/15 1519 Last data filed at 10/01/15 1700  Gross per 24 hour  Intake    360 ml  Output      0 ml  Net    360 ml   Filed Weights   09/30/15 1937  Weight: 99.1 kg (218 lb 7.6 oz)    Exam:   General:  Comfortable at rest.  Cardiovascular: S1-S2 normal. No murmurs. Pulse regular.  Respiratory: Good air entry bilaterally. No rhonchi or rales.  Abdomen: Soft and nontender. Normal bowel sounds. No organomegaly. Abdomen uniformly distended  Musculoskeletal: No pedal edema   Neurological: Intact  Data Reviewed: Basic Metabolic Panel:  Recent Labs Lab 09/30/15 1350 10/02/15 0415  NA 130* 132*  K 4.0 3.9  CL 102 103  CO2 21* 24  GLUCOSE 107* 91  BUN 9 8  CREATININE 0.62 0.56*  CALCIUM 8.4* 8.2*   Liver Function Tests:  Recent Labs Lab 09/30/15 1350 10/02/15 0415  AST 41 29  ALT 29 22  ALKPHOS 140* 107  BILITOT 2.5* 1.6*  PROT 7.4 5.6*  ALBUMIN 2.9* 2.3*    Recent Labs Lab 09/30/15 1350  LIPASE 30   No results for input(s): AMMONIA in the last 168 hours. CBC:  Recent  Labs Lab 09/30/15 1350 10/02/15 0415  WBC 3.9* 2.9*  HGB 9.8* 7.6*  HCT 30.9* 23.9*  MCV 75.0* 74.5*  PLT 258 146*   Cardiac Enzymes: No results for input(s): CKTOTAL, CKMB, CKMBINDEX, TROPONINI in the last 168  hours. BNP (last 3 results)  Recent Labs  01/04/15 2026  BNP 45.6    ProBNP (last 3 results) No results for input(s): PROBNP in the last 8760 hours.  CBG: No results for input(s): GLUCAP in the last 168 hours.  Recent Results (from the past 240 hour(s))  Culture, blood (routine x 2) Call MD if unable to obtain prior to antibiotics being given     Status: None (Preliminary result)   Collection Time: 09/30/15  8:50 PM  Result Value Ref Range Status   Specimen Description BLOOD RIGHT ARM  Final   Special Requests BOTTLES DRAWN AEROBIC AND ANAEROBIC 5CC EACH  Final   Culture   Final    NO GROWTH 2 DAYS Performed at [facility]    Report Status PENDING  Incomplete  Culture, blood (routine x 2) Call MD if unable to obtain prior to antibiotics being given     Status: None (Preliminary result)   Collection Time: 09/30/15  9:00 PM  Result Value Ref Range Status   Specimen Description BLOOD LEFT HAND  Final   Special Requests IN PEDIATRIC BOTTLE 4CC  Final   Culture   Final    NO GROWTH 2 DAYS Performed at [facility]    Report Status PENDING  Incomplete  Culture, body fluid-bottle     Status: None (Preliminary result)   Collection Time: 10/01/15 11:00 AM  Result Value Ref Range Status   Specimen Description FLUID PERITONEAL  Final   Special Requests NONE  Final   Culture   Final    NO GROWTH < 24 HOURS Performed at [facility]    Report Status PENDING  Incomplete  Gram stain     Status: None   Collection Time: 10/01/15 11:00 AM  Result Value Ref Range Status   Specimen Description FLUID PERITONEAL  Final   Special Requests NONE  Final   Gram Stain   Final    RARE WBC PRESENT, PREDOMINANTLY MONONUCLEAR NO ORGANISMS SEEN Performed at [facility]    Report Status 10/01/2015 FINAL  Final     Studies: US Paracentesis  10/01/2015  INDICATION: Cirrhosis, ascites. Request is made for diagnostic and therapeutic paracentesis. EXAM:  ULTRASOUND-GUIDED DIAGNOSTIC AND THERAPEUTIC PARACENTESIS COMPARISON:  None. MEDICATIONS: None. COMPLICATIONS: None immediate TECHNIQUE: Informed written consent was obtained from the patient after a discussion of the risks, benefits and alternatives to treatment. A timeout was performed prior to the initiation of the procedure. Initial ultrasound scanning demonstrates a large amount of ascites within the right lower abdominal quadrant. The right lower abdomen was prepped and draped in the usual sterile fashion. 1% lidocaine with epinephrine was used for local anesthesia. Under direct ultrasound guidance, a 19 gauge, 7-cm, Yueh catheter was introduced. An ultrasound image was saved for documentation purposed. The paracentesis was performed. The catheter was removed and a dressing was applied. The patient tolerated the procedure well without immediate post procedural complication. FINDINGS: A total of approximately 10.6 liters of turbid, yellow fluid was removed. Samples were sent to the laboratory as requested by the clinical team. IMPRESSION: Successful ultrasound-guided diagnostic and therapeutic paracentesis yielding 10.6 liters of peritoneal fluid. Read by: Jeananne Rama, PA-C Electronically Signed   By: Simonne Come  M.D.   On: 10/01/2015 12:52    Scheduled Meds: . sodium chloride   Intravenous Once  . azithromycin  500 mg Oral Q24H  . cefTRIAXone (ROCEPHIN)  IV  1 g Intravenous Q24H  . feeding supplement (ENSURE ENLIVE)  237 mL Oral Q24H  . folic acid  1 mg Oral Daily  . furosemide  20 mg Intravenous Once  . [START ON 10/03/2015] furosemide  40 mg Oral Daily  . hydrOXYzine  25-50 mg Oral QHS  . pantoprazole  40 mg Oral BID  . spironolactone  25 mg Oral Daily  . thiamine  100 mg Oral Daily   Continuous Infusions:    Time spent: 25 minutes    Moe Graca  [facility] Pager [phone]. If 7PM-7AM, please contact night-coverage at [email or web address], password [email or web address] 10/02/2015, 3:19 PM  LOS:  2 days

## 2015-10-03 ENCOUNTER — Encounter (HOSPITAL_COMMUNITY)
Admission: EM | Disposition: A | Discharge status: Home / Self Care | Payer: Self-pay | Source: Home / Self Care | Attending: Internal Medicine

## 2015-10-03 DIAGNOSIS — E871 Hypo-osmolality and hyponatremia: Secondary | ICD-10-CM

## 2015-10-03 LAB — CBC
HCT: 28.5 % — ABNORMAL LOW (ref 39.0–52.0)
Hemoglobin: 9.5 g/dL — ABNORMAL LOW (ref 13.0–17.0)
MCH: 25.5 pg — ABNORMAL LOW (ref 26.0–34.0)
MCHC: 33.3 g/dL (ref 30.0–36.0)
MCV: 76.4 fL — AB (ref 78.0–100.0)
PLATELETS: 152 10*3/uL (ref 150–400)
RBC: 3.73 MIL/uL — ABNORMAL LOW (ref 4.22–5.81)
RDW: 18 % — AB (ref 11.5–15.5)
WBC: 3.3 10*3/uL — AB (ref 4.0–10.5)

## 2015-10-03 LAB — COMPREHENSIVE METABOLIC PANEL
ALT: 22 U/L (ref 17–63)
AST: 31 U/L (ref 15–41)
Albumin: 2.4 g/dL — ABNORMAL LOW (ref 3.5–5.0)
Alkaline Phosphatase: 103 U/L (ref 38–126)
Anion gap: 5 (ref 5–15)
BUN: 10 mg/dL (ref 6–20)
CHLORIDE: 104 mmol/L (ref 101–111)
CO2: 24 mmol/L (ref 22–32)
Calcium: 8.2 mg/dL — ABNORMAL LOW (ref 8.9–10.3)
Creatinine, Ser: 0.58 mg/dL — ABNORMAL LOW (ref 0.61–1.24)
Glucose, Bld: 84 mg/dL (ref 65–99)
POTASSIUM: 4.1 mmol/L (ref 3.5–5.1)
Sodium: 133 mmol/L — ABNORMAL LOW (ref 135–145)
Total Bilirubin: 3.6 mg/dL — ABNORMAL HIGH (ref 0.3–1.2)
Total Protein: 5.9 g/dL — ABNORMAL LOW (ref 6.5–8.1)

## 2015-10-03 LAB — TYPE AND SCREEN
ABO/RH(D): B POS
ANTIBODY SCREEN: NEGATIVE
UNIT DIVISION: 0
UNIT DIVISION: 0

## 2015-10-03 LAB — AMMONIA: AMMONIA: 42 umol/L — AB (ref 9–35)

## 2015-10-03 SURGERY — COLONOSCOPY
Anesthesia: Moderate Sedation

## 2015-10-03 MED ORDER — ENSURE ENLIVE PO LIQD: 237.0000 mL | ORAL | Status: DC

## 2015-10-03 MED ORDER — PANTOPRAZOLE SODIUM 40 MG PO TBEC: 40.0000 mg | DELAYED_RELEASE_TABLET | Freq: Every day | ORAL | Status: DC

## 2015-10-03 MED ORDER — FUROSEMIDE 20 MG PO TABS: 40.0000 mg | ORAL_TABLET | Freq: Every day | ORAL | Status: DC

## 2015-10-03 MED ORDER — SPIRONOLACTONE 100 MG PO TABS: 100.0000 mg | ORAL_TABLET | Freq: Every day | ORAL | Status: DC

## 2015-10-03 MED ORDER — THIAMINE HCL 100 MG PO TABS: 100.0000 mg | ORAL_TABLET | Freq: Every day | ORAL | Status: DC

## 2015-10-03 MED ORDER — LEVOFLOXACIN 750 MG PO TABS: 750.0000 mg | ORAL_TABLET | Freq: Every day | ORAL | Status: DC

## 2015-10-03 MED ORDER — FOLIC ACID 1 MG PO TABS: 1.0000 mg | ORAL_TABLET | Freq: Every day | ORAL | Status: DC

## 2015-10-03 NOTE — Progress Notes (Signed)
[facility] Progress Note  Subjective:  Feels ok.  Feels like fluid in abdomen building back up again.    Objective:  Vital signs in last 24 hours: Temp:  [97.8 F (36.6 C)-98.4 F (36.9 C)] 98.4 F (36.9 C) (01/06 0529) Pulse Rate:  [90-106] 90 (01/06 0529) Resp:  [16-18] 16 (01/06 0529) BP: (92-123)/(56-78) 98/65 mmHg (01/06 0529) SpO2:  [97 %-100 %] 97 % (01/06 0529) Last BM Date: 09/30/15 General:  Alert, Well-developed, in NAD Heart:  Regular rate and rhythm; no murmurs Pulm:  CTAB.  No W/R/R noted. Abdomen:  Soft, distended with ascites fluid.  BS present.  Non-tender. Extremities:  Without edema. Neurologic:  Alert and  oriented x4;  grossly normal neurologically. Psych:  Alert and cooperative. Normal mood and affect.  Intake/Output from previous day: 01/05 0701 - 01/06 0700 In: 1172.5 [P.O.:240; I.V.:250; Blood:682.5] Out: -   Lab Results:  Recent Labs  09/30/15 1350 10/02/15 0415 10/03/15 0443  WBC 3.9* 2.9* 3.3*  HGB 9.8* 7.6* 9.5*  HCT 30.9* 23.9* 28.5*  PLT 258 146* 152   BMET  Recent Labs  09/30/15 1350 10/02/15 0415 10/03/15 0443  NA 130* 132* 133*  K 4.0 3.9 4.1  CL 102 103 104  CO2 21* 24 24  GLUCOSE 107* 91 84  BUN 9 8 10   CREATININE 0.62 0.56* 0.58*  CALCIUM 8.4* 8.2* 8.2*   LFT  Recent Labs  10/03/15 0443  PROT 5.9*  ALBUMIN 2.4*  AST 31  ALT 22  ALKPHOS 103  BILITOT 3.6*   PT/INR  Recent Labs  10/02/15 1612  LABPROT 19.5*  INR 1.65*   US Paracentesis  10/01/2015  INDICATION: Cirrhosis, ascites. Request is made for diagnostic and therapeutic paracentesis. EXAM: ULTRASOUND-GUIDED DIAGNOSTIC AND THERAPEUTIC PARACENTESIS COMPARISON:  None. MEDICATIONS: None. COMPLICATIONS: None immediate TECHNIQUE: Informed written consent was obtained from the patient after a discussion of the risks, benefits and alternatives to treatment. A timeout was performed prior to the initiation of the procedure. Initial  ultrasound scanning demonstrates a large amount of ascites within the right lower abdominal quadrant. The right lower abdomen was prepped and draped in the usual sterile fashion. 1% lidocaine with epinephrine was used for local anesthesia. Under direct ultrasound guidance, a 19 gauge, 7-cm, Yueh catheter was introduced. An ultrasound image was saved for documentation purposed. The paracentesis was performed. The catheter was removed and a dressing was applied. The patient tolerated the procedure well without immediate post procedural complication. FINDINGS: A total of approximately 10.6 liters of turbid, yellow fluid was removed. Samples were sent to the laboratory as requested by the clinical team. IMPRESSION: Successful ultrasound-guided diagnostic and therapeutic paracentesis yielding 10.6 liters of peritoneal fluid. Read by: Jeananne Rama, PA-C Electronically Signed   By: Simonne Come M.D.   On: 10/01/2015 12:52   Assessment / Plan: *ETOH cirrhosis:  Last admitted alcohol consumption was beer in mid to late 06/2015.  *Ascites, s/p nearly 11 liter tap 1/4, fluid studies neg for SBP.  Now on Lasix 40 mg daily and spironolactone 100 mg daily.  *CAP:  On abx.   *Mild hyponatremia  *Microcytic anemia/IDA:  S/p 2 units PRBC's.  No sign of overt GI bleeding.  *Coagulopathy  -Patient scheduled for follow-up visit at our office with me in 2 weeks (appt information is in D/C instructions) at which time we will reassess regarding breathing status and PNA to schedule EGD and colonoscopy electively (to screen for varices and evaluate IDA).  Also will need to check labs and make any diuretic adjustments at that time as well.  Needs to continue to avoid ETOH.  Needs low sodium diet, maximum 2 Liter fluid daily. Ok for discharge from GI standpoint for now.   LOS: 3 days   [name], [name].  10/03/2015, 9:58 AM  Pager number [phone]   GI ATTENDING  Interval history and data reviewed. Agree with interval  progress note as outlined above. Stable without GI bleeding. Serum sodium improved. Continue on current diuretic regimen, 2 g sodium diet, and GI follow-up. No plans for elective endoscopic workup at this time given pneumonia. Reevaluate in the office as outpatient. He will need continuity care. He needs PCP as well. We will Sign off. Thank you  Wilhemina BonitoJohn N. Eda KeysPerry, Jr., M.D. [facility] Division of Gastroenterology

## 2015-10-03 NOTE — Care Management Important Message (Signed)
Important Message  Patient Details  Name: Devin Noble MRN: [id number] Date of Birth: [birth year]   Medicare Important Message Given:  Yes    Haskell FlirtJamison, Ryler Laskowski 10/03/2015, 11:48 AMImportant Message  Patient Details  Name: Devin Noble MRN: [id number] Date of Birth: [birth year]   Medicare Important Message Given:  Yes    Haskell FlirtJamison, Jalexia Lalli 10/03/2015, 11:48 AM

## 2015-10-04 NOTE — Discharge Summary (Signed)
Devin Noble, is a 60 y.o. male  DOB [birth year]  MRN [id number].  Admission date:  09/30/2015  Admitting Physician  Hillary Bow, DO  Discharge Date:  10/03/2015   Primary MD  Kaleen Mask, MD  Recommendations for primary care physician for things to follow:  Arrange GI follow-up to screen for varices and optimize liver cirrhosis management.   Admission Diagnosis   Community acquired pneumonia [J18.9] Ascites [R18.8]   Discharge Diagnosis  Community acquired pneumonia [J18.9] Ascites [R18.8]   Active Problems:   Cirrhosis of liver with ascites (HCC)   Protein-calorie malnutrition, severe   Iron deficiency anemia due to chronic blood loss      Hospital Course  Devin Noble is a 60 year old male with alcohol-related liver cirrhosis resulting in ascites, who presented with worsening abdominal distention followed by coughing and he was found to have pneumonia and ascites with no evidence of SBP. About 11 L of ascitic fluid was drained and patient received albumin. Apparently, compliance has been an issue and he has not followed consistently with GI in the past. He responded to treatment and was seen by [name]. He received 2 units PRBC after hemoglobin dropped. He has iron deficiency anemia likely resulting from chronic GI blood loss. I have encouraged him to stop using nonsteroidals. He'll be on PPI. He will need EGD arranged outpatient to screen for varices. He feels close to his baseline and will therefore discharge home to complete 4 more days of Levaquin. Please refer to the daily progresslow for details of patient's hospital stay; 10/01/15: I have seen and examined Mr. [name] at bedside and reviewed his chart. Devin Noble is a pleasant 60 y.o. male with alcohol related cirrhosis of the liver  with ascites who presented to the ED with c/o 5 weeks worsening abdominal distention and 1 week history of worsening SOB, cough and he was found to have left lower lobe pneumonia(CXR showed "Left lower lobe airspace consolidation consistent with pneumonia. Left pleural effusion. Lungs elsewhere clear. Cardiac silhouette within normal limits. Followup PA and lateral chest radiographs recommended in 3-4 weeks following trial of antibiotic therapy ensure resolution and exclude underlying malignancy") and ascites, now status post interventional radiology guided paracentesis 10.6L drained. Appreciate IR help. Patient is on antibiotics and diuretics. Will give folic acid/thiamine/abdomen/spironolactone/Protonix and consider beta blocker. He will continue antibiotics for pneumonia to complete course. He needs follow-up with GI. He also has an umbilical hernia which can be addressed once ascites is under control. 10/02/15: Hemoglobin has dropped to 7.6 g/dL. Patient microcytic therefore likely losing blood gradually-? Variceal bleeding. He hasn't noticed any blood in stool. Will obtain iron studies, discontinue heparin/naproxen, transfuse 2 units PRBC and consult GI for liver cirrhosis/microcytic anemia. Meanwhile, will increase Protonix frequency to twice a day. Will also increase dose of Lasix and obtain stool Hemoccult. 10/03/15: Responded to by mouth received transfusion. Appreciate GI. Patient to follow with GI outpatient. No procedures in the house. He will complete a few more days of antibiotics, continue  diuretics, and low-salt diet.  Discharge Condition Stable.  Consults obtained  None  Follow UP Follow-up Information    Follow up with [name], [name]., PA-C On 10/16/2015.   Specialty:  Gastroenterology   Why:  3:00 pm   Contact information:   [address] [place] [address] [phone]         Discharge Instructions  and  Discharge Medications  Discharge Instructions    Diet - low sodium  heart healthy    Complete by:  As directed      Discharge instructions    Complete by:  As directed   Please follow with [facility]     Increase activity slowly    Complete by:  As directed             Medication List    STOP taking these medications        ALEVE PM PO      TAKE these medications        feeding supplement (ENSURE ENLIVE) Liqd  Take 237 mLs by mouth daily.     folic acid 1 MG tablet  Commonly known as:  FOLVITE  Take 1 tablet (1 mg total) by mouth daily.     furosemide 20 MG tablet  Commonly known as:  LASIX  Take 2 tablets (40 mg total) by mouth daily.     hydrOXYzine 25 MG capsule  Commonly known as:  VISTARIL  TAKE 1 TO 2 CAPSULES BY MOUTH AT BEDTIME (MAY CAUSE DROWSINESS)     levofloxacin 750 MG tablet  Commonly known as:  LEVAQUIN  Take 1 tablet (750 mg total) by mouth daily.     pantoprazole 40 MG tablet  Commonly known as:  PROTONIX  Take 1 tablet (40 mg total) by mouth daily.     spironolactone 100 MG tablet  Commonly known as:  ALDACTONE  Take 1 tablet (100 mg total) by mouth daily.     thiamine 100 MG tablet  Take 1 tablet (100 mg total) by mouth daily.        Diet and Activity recommendation: See Discharge Instructions above  Major procedures and Radiology Reports - PLEASE review detailed and final reports for all details, in brief -    Dg Chest 2 View  09/30/2015  CLINICAL DATA:  Chest pain and shortness of breath for 2 days. EXAM: CHEST  2 VIEW COMPARISON:  January 04, 2015 FINDINGS: There is extensive left lower lobe airspace consolidation with effusion on the left. The lungs elsewhere clear. Heart size and pulmonary vascularity are normal. No adenopathy. Patient is status post total shoulder replacements bilaterally. There is marked collapse of a lower thoracic vertebral body, stable. IMPRESSION: Left lower lobe airspace consolidation consistent with pneumonia. Left pleural effusion. Lungs elsewhere clear. Cardiac silhouette within  normal limits. Followup PA and lateral chest radiographs recommended in 3-4 weeks following trial of antibiotic therapy to ensure resolution and exclude underlying malignancy. Electronically Signed   By: Bretta Bang III M.D.   On: 09/30/2015 13:35   US Paracentesis  10/01/2015  INDICATION: Cirrhosis, ascites. Request is made for diagnostic and therapeutic paracentesis. EXAM: ULTRASOUND-GUIDED DIAGNOSTIC AND THERAPEUTIC PARACENTESIS COMPARISON:  None. MEDICATIONS: None. COMPLICATIONS: None immediate TECHNIQUE: Informed written consent was obtained from the patient after a discussion of the risks, benefits and alternatives to treatment. A timeout was performed prior to the initiation of the procedure. Initial ultrasound scanning demonstrates a large amount of ascites within the right lower abdominal quadrant.  The right lower abdomen was prepped and draped in the usual sterile fashion. 1% lidocaine with epinephrine was used for local anesthesia. Under direct ultrasound guidance, a 19 gauge, 7-cm, Yueh catheter was introduced. An ultrasound image was saved for documentation purposed. The paracentesis was performed. The catheter was removed and a dressing was applied. The patient tolerated the procedure well without immediate post procedural complication. FINDINGS: A total of approximately 10.6 liters of turbid, yellow fluid was removed. Samples were sent to the laboratory as requested by the clinical team. IMPRESSION: Successful ultrasound-guided diagnostic and therapeutic paracentesis yielding 10.6 liters of peritoneal fluid. Read by: Jeananne RamaKevin Allred, PA-C Electronically Signed   By: Simonne ComeJohn  Watts M.D.   On: 10/01/2015 12:52    Micro Results   Recent Results (from the past 240 hour(s))  Culture, blood (routine x 2) Call MD if unable to obtain prior to antibiotics being given     Status: None (Preliminary result)   Collection Time: 09/30/15  8:50 PM  Result Value Ref Range Status   Specimen Description BLOOD  RIGHT ARM  Final   Special Requests BOTTLES DRAWN AEROBIC AND ANAEROBIC 5CC EACH  Final   Culture   Final    NO GROWTH 3 DAYS [facility]    Report Status PENDING  Incomplete  Culture, blood (routine x 2) Call MD if unable to obtain prior to antibiotics being given     Status: None (Preliminary result)   Collection Time: 09/30/15  9:00 PM  Result Value Ref Range Status   Specimen Description BLOOD LEFT HAND  Final   Special Requests IN PEDIATRIC BOTTLE 4CC  Final   Culture   Final    NO GROWTH 3 DAYS [facility]    Report Status PENDING  Incomplete  Culture, body fluid-bottle     Status: None (Preliminary result)   Collection Time: 10/01/15 11:00 AM  Result Value Ref Range Status   Specimen Description FLUID PERITONEAL  Final   Special Requests NONE  Final   Culture   Final    NO GROWTH 2 DAYS [facility]formed at [facility]    Report Status PENDING  Incomplete  Gram stain     Status: None   Collection Time: 10/01/15 11:00 AM  Result Value Ref Range Status   Specimen Description FLUID PERITONEAL  Final   Special Requests NONE  Final   Gram Stain   Final    RARE WBC PRESENT, PREDOMINANTLY MONONUCLEAR NO ORGANISMS SEEN Performed at [facility] [place]    Report Status 10/01/2015 FINAL  Final       Today   Subjective:   Delfin GantPerry Whiters today has no headache,no chest abdominal pain,no new weakness tingling or numbness, feels much better wants to go home today.   Objective:   Blood pressure 98/65, pulse 90, temperature 98.4 F (36.9 C), temperature source Oral, resp. rate 16, height 5\' 11"  (1.803 m), weight 99.1 kg (218 lb 7.6 oz), SpO2 97 %.   Intake/Output Summary (Last 24 hours) at 10/04/15 0001 Last data filed at 10/03/15 0149  Gross per 24 hour  Intake    320 ml  Output      0 ml  Net    320 ml    Exam Awake Alert, Oriented x 3, No new F.N deficits, Normal affect [place].AT,PERRAL Supple Neck,No JVD, No cervical  lymphadenopathy appriciated.  Symmetrical Chest wall movement, Good air movement bilaterally, CTAB RRR,No Gallops,Rubs or new Murmurs, No Parasternal Heave +  ve B.Sounds, Abd Soft, Non tender, No organomegaly appriciated, No rebound -guarding or rigidity. No Cyanosis, Clubbing or edema, No new Rash or bruise  Data Review   CBC w Diff: Lab Results  Component Value Date   WBC 3.3* 10/03/2015   WBC 3.3* 12/13/2014   HGB 9.5* 10/03/2015   HGB 10.5* 12/13/2014   HCT 28.5* 10/03/2015   HCT 33.8* 12/13/2014   PLT 152 10/03/2015   LYMPHOPCT 22 01/04/2015   MONOPCT 17* 01/04/2015   EOSPCT 12* 01/04/2015   BASOPCT 3* 01/04/2015    CMP: Lab Results  Component Value Date   NA 133* 10/03/2015   K 4.1 10/03/2015   CL 104 10/03/2015   CO2 24 10/03/2015   BUN 10 10/03/2015   CREATININE 0.58* 10/03/2015   CREATININE 0.72 12/13/2014   PROT 5.9* 10/03/2015   ALBUMIN 2.4* 10/03/2015   BILITOT 3.6* 10/03/2015   ALKPHOS 103 10/03/2015   AST 31 10/03/2015   ALT 22 10/03/2015  .   Total Time in preparing paper work, data evaluation and todays exam - 25 minutes  Pollyanna Levay M.D on 10/04/2015 at 12:01 AM  [facility] Office  [phone]

## 2015-10-05 LAB — CULTURE, BLOOD (ROUTINE X 2)
Culture: NO GROWTH
Culture: NO GROWTH

## 2015-10-06 LAB — CULTURE, BODY FLUID-BOTTLE: CULTURE: NO GROWTH

## 2015-10-06 LAB — CULTURE, BODY FLUID W GRAM STAIN -BOTTLE

## 2015-10-16 ENCOUNTER — Other Ambulatory Visit (INDEPENDENT_AMBULATORY_CARE_PROVIDER_SITE_OTHER): Payer: Medicare Other

## 2015-10-16 ENCOUNTER — Encounter: Payer: Self-pay | Admitting: Gastroenterology

## 2015-10-16 ENCOUNTER — Ambulatory Visit (INDEPENDENT_AMBULATORY_CARE_PROVIDER_SITE_OTHER): Payer: Medicare Other | Admitting: Gastroenterology

## 2015-10-16 VITALS — BP 118/70 | HR 96 | Ht 71.0 in | Wt 216.4 lb

## 2015-10-16 DIAGNOSIS — D509 Iron deficiency anemia, unspecified: Secondary | ICD-10-CM | POA: Diagnosis not present

## 2015-10-16 DIAGNOSIS — K7460 Unspecified cirrhosis of liver: Secondary | ICD-10-CM

## 2015-10-16 DIAGNOSIS — R188 Other ascites: Secondary | ICD-10-CM

## 2015-10-16 LAB — CBC WITH DIFFERENTIAL/PLATELET
BASOS ABS: 0 10*3/uL (ref 0.0–0.1)
Basophils Relative: 0.4 % (ref 0.0–3.0)
EOS ABS: 0.5 10*3/uL (ref 0.0–0.7)
Eosinophils Relative: 8.4 % — ABNORMAL HIGH (ref 0.0–5.0)
HCT: 35.8 % — ABNORMAL LOW (ref 39.0–52.0)
Hemoglobin: 11.5 g/dL — ABNORMAL LOW (ref 13.0–17.0)
LYMPHS ABS: 1 10*3/uL (ref 0.7–4.0)
LYMPHS PCT: 16.8 % (ref 12.0–46.0)
MCHC: 32.2 g/dL (ref 30.0–36.0)
MCV: 77.1 fl — ABNORMAL LOW (ref 78.0–100.0)
MONO ABS: 1.2 10*3/uL — AB (ref 0.1–1.0)
Monocytes Relative: 20.9 % — ABNORMAL HIGH (ref 3.0–12.0)
NEUTROS ABS: 3.1 10*3/uL (ref 1.4–7.7)
NEUTROS PCT: 53.5 % (ref 43.0–77.0)
PLATELETS: 261 10*3/uL (ref 150.0–400.0)
RBC: 4.65 Mil/uL (ref 4.22–5.81)
RDW: 22.1 % — ABNORMAL HIGH (ref 11.5–15.5)
WBC: 5.8 10*3/uL (ref 4.0–10.5)

## 2015-10-16 LAB — COMPREHENSIVE METABOLIC PANEL
ALT: 23 U/L (ref 0–53)
AST: 37 U/L (ref 0–37)
Albumin: 2.9 g/dL — ABNORMAL LOW (ref 3.5–5.2)
Alkaline Phosphatase: 126 U/L — ABNORMAL HIGH (ref 39–117)
BILIRUBIN TOTAL: 2.3 mg/dL — AB (ref 0.2–1.2)
BUN: 11 mg/dL (ref 6–23)
CHLORIDE: 98 meq/L (ref 96–112)
CO2: 24 mEq/L (ref 19–32)
CREATININE: 0.75 mg/dL (ref 0.40–1.50)
Calcium: 8.5 mg/dL (ref 8.4–10.5)
GFR: 113.36 mL/min (ref >60.00)
GLUCOSE: 97 mg/dL (ref 70–99)
Potassium: 4 mEq/L (ref 3.5–5.1)
SODIUM: 128 meq/L — AB (ref 135–145)
Total Protein: 7.6 g/dL (ref 6.0–8.3)

## 2015-10-16 LAB — PROTIME-INR
INR: 1.8 ratio — ABNORMAL HIGH (ref 0.8–1.0)
Prothrombin Time: 18.7 s — ABNORMAL HIGH (ref 9.6–13.1)

## 2015-10-16 NOTE — Patient Instructions (Signed)
We have scheduled you for a paracetesis 10/23/15. Please report to [facility] at 8:45 am.   Your physician has requested that you go to the basement for lab work before leaving today.

## 2015-10-21 ENCOUNTER — Encounter (HOSPITAL_COMMUNITY): Payer: Medicare Other

## 2015-10-21 ENCOUNTER — Ambulatory Visit (HOSPITAL_COMMUNITY): Payer: Medicare Other

## 2015-10-23 ENCOUNTER — Ambulatory Visit (HOSPITAL_COMMUNITY): Payer: Medicare Other

## 2015-10-24 ENCOUNTER — Encounter: Payer: Self-pay | Admitting: Gastroenterology

## 2015-10-24 ENCOUNTER — Ambulatory Visit (HOSPITAL_COMMUNITY)
Admission: RE | Admit: 2015-10-24 | Discharge: 2015-10-24 | Disposition: A | Discharge status: Home / Self Care | Payer: Medicare Other | Source: Ambulatory Visit | Attending: Gastroenterology | Admitting: Gastroenterology

## 2015-10-24 DIAGNOSIS — R188 Other ascites: Secondary | ICD-10-CM | POA: Diagnosis not present

## 2015-10-24 DIAGNOSIS — K7460 Unspecified cirrhosis of liver: Secondary | ICD-10-CM | POA: Diagnosis not present

## 2015-10-24 DIAGNOSIS — K7031 Alcoholic cirrhosis of liver with ascites: Secondary | ICD-10-CM | POA: Insufficient documentation

## 2015-10-24 LAB — GRAM STAIN

## 2015-10-24 LAB — BODY FLUID CELL COUNT WITH DIFFERENTIAL
Eos, Fluid: 0 %
Lymphs, Fluid: 73 %
MONOCYTE-MACROPHAGE-SEROUS FLUID: 24 % — AB (ref 50–90)
Neutrophil Count, Fluid: 3 % (ref 0–25)
Total Nucleated Cell Count, Fluid: 91 cu mm (ref 0–1000)

## 2015-10-24 MED ORDER — ALBUMIN HUMAN 25 % IV SOLN: 25.0000 g | Freq: Once | INTRAVENOUS | Status: DC

## 2015-10-24 MED ORDER — ALBUMIN HUMAN 25 % IV SOLN
50.0000 g | INTRAVENOUS | Status: AC
  Administered 2015-10-24: 50 g via INTRAVENOUS
  Filled 2015-10-24: qty 200

## 2015-10-24 MED ORDER — LIDOCAINE HCL (PF) 1 % IJ SOLN
INTRAMUSCULAR | Status: AC
  Filled 2015-10-24: qty 10

## 2015-10-24 NOTE — Progress Notes (Signed)
     10/16/2015 Devin Noble [id number] [birth year]   History of Present Illness:  Patient is here for hospital follow-up of cirrhosis/ascites and anemia.  Was admitted 09/29/2014-10/02/2014.  See consult note from GI 10/01/2014.  He had LVP on 1/4 with 10.6 Liters removed; negative SBP.  Is on Lasix 40 mg daily and spironolactone 100 mg daily.  Needs EGD for evaluation of anemia and variceal screening also possibly colonoscopy as well.  Had PNA so plan was to reassess in office follow-up before scheduling these.  Has completed antibiotics and breathing is good.  He is trying to follow-up low sodium diet.  Weighing himself daily.  Tells me that his weight is up about 12 pounds since hospital D/C just 2 weeks ago.  Complains mostly of abdominal distention and fluid re-accumulation.  His anemia required 2 units PRBC's for Hgb of 7.6 grams.  No sign of over GI bleeding.  Iron studies are low.   Current Medications, Allergies, Past Medical History, Past Surgical History, Family History and Social History were reviewed in [employer] record.   Physical Exam: BP 118/70 mmHg  Pulse 96  Ht  (1.803 m)  Wt 216 lb 6.4 oz (98.158 kg)  BMI 30.19 kg/m2 General: Well developed white male in no acute distress Head: Normocephalic and atraumatic Eyes:  Sclerae anicteric, conjunctiva pink  Ears: Normal auditory acuity Lungs: Clear throughout to auscultation Heart: Regular rate and rhythm Abdomen:  Largely distended with ascites fluid but still somewhat soft.  BS present.  Non-tender. Musculoskeletal: Symmetrical with no gross deformities  Extremities: No edema  Neurological: Alert oriented x 4, grossly non-focal Psychological:  Alert and cooperative. Normal mood and affect  Assessment and Recommendations: *ETOH cirrhosis: Last admitted alcohol consumption was beer in mid to late 06/2015.  *Ascites, s/p nearly 11 liter tap 1/4, fluid studies neg for SBP. Now on Lasix 40 mg  daily and spironolactone 100 mg daily.  *CAP:  Completed course of antibiotics  *Mild hyponatremia  *Microcytic anemia/IDA: S/p 2 units PRBC's. No sign of overt GI bleeding.  *Coagulopathy:  INR 1.65  -Discussed with Dr. Marina Goodell.  Will repeat labs today including CBC, CMP, and PT/INR.  Will also set him up for repeat LVP with fluid sent for culture and cell count.  Continue Lasix 40 mg daily and aldactone 100 mg daily for now.  I will see him back in about 4 weeks at which time we will reassess and possible schedule EGD +/- colonoscopy.  Continue to weight himself daily, which he has been doing.  2 gram sodium diet; spent a long time discussing this with him.

## 2015-10-24 NOTE — Procedures (Signed)
Successful US guided paracentesis from RLQ.  Yielded 12 liters of hazy yellow fluid.  No immediate complications.  Pt tolerated well.   Specimen was sent for labs.  Raeli Wiens S Suraj Ramdass PA-C 10/24/2015 3:21 PM

## 2015-10-27 ENCOUNTER — Telehealth: Payer: Self-pay | Admitting: *Deleted

## 2015-10-27 ENCOUNTER — Other Ambulatory Visit: Payer: Self-pay | Admitting: *Deleted

## 2015-10-27 DIAGNOSIS — R188 Other ascites: Principal | ICD-10-CM

## 2015-10-27 DIAGNOSIS — K7460 Unspecified cirrhosis of liver: Secondary | ICD-10-CM

## 2015-10-27 LAB — PATHOLOGIST SMEAR REVIEW

## 2015-10-27 NOTE — Telephone Encounter (Signed)
Lab in [facility]. Patient aware of results and recommendation. He will come for lab.

## 2015-10-27 NOTE — Progress Notes (Signed)
Agree with assessment and plans as outlined as well as follow-up as recommended

## 2015-10-27 NOTE — Telephone Encounter (Signed)
-----   Message from Leta Baptist, PA-C sent at 10/27/2015 10:31 AM EST ----- Please let patient know that overall his labs when I saw him were stable and fluid from paracentesis negative for infection.  Since they took such a large amount of fluid from him last week I would like him to come and have labs drawn again today or tomorrow to recheck his renal function and electrolytes (BMP).  Thank you,  Jess

## 2015-10-28 ENCOUNTER — Other Ambulatory Visit (INDEPENDENT_AMBULATORY_CARE_PROVIDER_SITE_OTHER): Payer: Medicare Other

## 2015-10-28 DIAGNOSIS — K7460 Unspecified cirrhosis of liver: Secondary | ICD-10-CM | POA: Diagnosis not present

## 2015-10-28 DIAGNOSIS — R188 Other ascites: Principal | ICD-10-CM

## 2015-10-28 LAB — BASIC METABOLIC PANEL
BUN: 8 mg/dL (ref 6–23)
CALCIUM: 8.9 mg/dL (ref 8.4–10.5)
CO2: 21 mEq/L (ref 19–32)
CREATININE: 0.82 mg/dL (ref 0.40–1.50)
Chloride: 102 mEq/L (ref 96–112)
GFR: 102.26 mL/min (ref >60.00)
GLUCOSE: 163 mg/dL — AB (ref 70–99)
POTASSIUM: 3.9 meq/L (ref 3.5–5.1)
Sodium: 132 mEq/L — ABNORMAL LOW (ref 135–145)

## 2015-10-29 LAB — CULTURE, BODY FLUID-BOTTLE

## 2015-10-29 LAB — CULTURE, BODY FLUID W GRAM STAIN -BOTTLE: Culture: NO GROWTH

## 2015-11-10 ENCOUNTER — Ambulatory Visit (INDEPENDENT_AMBULATORY_CARE_PROVIDER_SITE_OTHER): Payer: Medicare Other | Admitting: Gastroenterology

## 2015-11-10 ENCOUNTER — Other Ambulatory Visit (INDEPENDENT_AMBULATORY_CARE_PROVIDER_SITE_OTHER): Payer: Medicare Other

## 2015-11-10 ENCOUNTER — Encounter: Payer: Self-pay | Admitting: Gastroenterology

## 2015-11-10 VITALS — BP 90/62 | HR 59 | Ht 72.0 in | Wt 201.6 lb

## 2015-11-10 DIAGNOSIS — R188 Other ascites: Secondary | ICD-10-CM

## 2015-11-10 DIAGNOSIS — K7031 Alcoholic cirrhosis of liver with ascites: Secondary | ICD-10-CM | POA: Diagnosis not present

## 2015-11-10 LAB — COMPREHENSIVE METABOLIC PANEL WITH GFR
ALT: 17 U/L (ref 0–53)
AST: 34 U/L (ref 0–37)
Albumin: 2.8 g/dL — ABNORMAL LOW (ref 3.5–5.2)
Alkaline Phosphatase: 131 U/L — ABNORMAL HIGH (ref 39–117)
BUN: 11 mg/dL (ref 6–23)
CO2: 23 meq/L (ref 19–32)
Calcium: 8.5 mg/dL (ref 8.4–10.5)
Chloride: 98 meq/L (ref 96–112)
Creatinine, Ser: 0.83 mg/dL (ref 0.40–1.50)
GFR: 100.83 mL/min
Glucose, Bld: 117 mg/dL — ABNORMAL HIGH (ref 70–99)
Potassium: 4.1 meq/L (ref 3.5–5.1)
Sodium: 126 meq/L — ABNORMAL LOW (ref 135–145)
Total Bilirubin: 2.6 mg/dL — ABNORMAL HIGH (ref 0.2–1.2)
Total Protein: 7.5 g/dL (ref 6.0–8.3)

## 2015-11-10 LAB — PROTIME-INR
INR: 1.7 ratio — ABNORMAL HIGH (ref 0.8–1.0)
Prothrombin Time: 18.6 s — ABNORMAL HIGH (ref 9.6–13.1)

## 2015-11-10 LAB — CBC WITH DIFFERENTIAL/PLATELET
Basophils Absolute: 0 K/uL (ref 0.0–0.1)
Basophils Relative: 0.5 % (ref 0.0–3.0)
Eosinophils Absolute: 0.1 K/uL (ref 0.0–0.7)
Eosinophils Relative: 3.3 % (ref 0.0–5.0)
HCT: 33.4 % — ABNORMAL LOW (ref 39.0–52.0)
Hemoglobin: 11.1 g/dL — ABNORMAL LOW (ref 13.0–17.0)
Lymphocytes Relative: 12.9 % (ref 12.0–46.0)
Lymphs Abs: 0.6 K/uL — ABNORMAL LOW (ref 0.7–4.0)
MCHC: 33.3 g/dL (ref 30.0–36.0)
MCV: 77.7 fl — ABNORMAL LOW (ref 78.0–100.0)
Monocytes Absolute: 1.1 K/uL — ABNORMAL HIGH (ref 0.1–1.0)
Monocytes Relative: 25.6 % — ABNORMAL HIGH (ref 3.0–12.0)
Neutro Abs: 2.5 K/uL (ref 1.4–7.7)
Neutrophils Relative %: 57.7 % (ref 43.0–77.0)
Platelets: 224 K/uL (ref 150.0–400.0)
RBC: 4.3 Mil/uL (ref 4.22–5.81)
RDW: 23.9 % — ABNORMAL HIGH (ref 11.5–15.5)
WBC: 4.3 K/uL (ref 4.0–10.5)

## 2015-11-10 LAB — AMMONIA: Ammonia: 125 umol/L — ABNORMAL HIGH (ref 11–35)

## 2015-11-10 MED ORDER — FOLIC ACID 1 MG PO TABS: 1.0000 mg | ORAL_TABLET | Freq: Every day | ORAL | Status: DC

## 2015-11-10 MED ORDER — THIAMINE HCL 100 MG PO TABS: 100.0000 mg | ORAL_TABLET | Freq: Every day | ORAL | Status: DC

## 2015-11-10 MED ORDER — FUROSEMIDE 20 MG PO TABS: 40.0000 mg | ORAL_TABLET | Freq: Every day | ORAL | Status: DC

## 2015-11-10 MED ORDER — PANTOPRAZOLE SODIUM 40 MG PO TBEC: 40.0000 mg | DELAYED_RELEASE_TABLET | Freq: Every day | ORAL | Status: DC

## 2015-11-10 MED ORDER — SPIRONOLACTONE 100 MG PO TABS: 100.0000 mg | ORAL_TABLET | Freq: Every day | ORAL | Status: DC

## 2015-11-10 MED ORDER — ALBUMIN HUMAN 25 % IV SOLN: 25.0000 g | Freq: Once | INTRAVENOUS | Status: DC

## 2015-11-10 NOTE — Patient Instructions (Signed)
Please go to the basement level to have your labs drawn.  We sent refills for medications to [facility] [address]  We have scheduled you for an US paracentesis.  Go to [facility] Radiology, [facility]. 11:15 am.  There is valet parking.

## 2015-11-10 NOTE — Progress Notes (Signed)
     11/10/2015 PAIGE VANDERWOUDE [id number] December 03, 1955   History of Present Illness:  Patient is here for follow-up of cirrhosis/ascites and anemia.  Was admitted to hospital from 09/29/2014-10/02/2014.  See consult note from GI 10/01/2014.  He had LVP on 1/4 with 10.6 Liters removed; negative SBP.  Is on Lasix 40 mg daily and spironolactone 100 mg daily.  He is trying to follow-up low sodium diet.  Weighing himself daily.  Was seen on 1/19 and weighed 216 pounds.  Weight today is 201 pounds.  Had LVP on 1/27 with 12 Liters removed.  He says that his weight has increased by about 5 pounds again since paracentesis.  Admits to having a beer over the weekend.  Says that sometimes he gets a little dizzy and has some confusion/forgetfullness at times as well.  Has anemia and required 2 units PRBC's for Hgb of 7.6 grams during hospitalization.  No sign of over GI bleeding.  Iron studies are low.  Has varices on CT scan.  No EGD has been performed at this point.   Current Medications, Allergies, Past Medical History, Past Surgical History, Family History and Social History were reviewed in [employer] record.   Physical Exam: BP 90/62 mmHg  Pulse 59  Ht 6' (1.829 m)  Wt 201 lb 9.6 oz (91.445 kg)  BMI 27.34 kg/m2 General: Well developed white male in no acute distress Head: Normocephalic and atraumatic Eyes:  Sclerae anicteric, conjunctiva pink; right eye with birthmark and droopy appearance  Ears: Normal auditory acuity Lungs: Clear throughout to auscultation Heart: Regular rate and rhythm Abdomen: Moderately distended with ascites fluid.  BS present.  Non-tender.   Musculoskeletal: Symmetrical with no gross deformities  Extremities: No edema  Neurological: Alert oriented x 4, grossly non-focal Psychological:  Alert and cooperative. Normal mood and affect  Assessment and Recommendations: *ETOH cirrhosis:  Admitted to Dr. Marina Goodell that he had one beer this past weekend.    *Ascites, s/p nearly liter tap 1/4, fluid studies neg for SBP and then 12 Liters removed on 1/27.  Now on Lasix 40 mg daily and spironolactone 100 mg daily.  Will refill those today as well.  May consider increasing doses in the future but BP remains soft and he experiences occasional dizziness, so need to exercise caution.  *Mild hyponatremia  *Microcytic anemia/IDA:  S/p 2 units PRBC's during hospitalization.  No sign of overt GI bleeding.  ? EGD for varices seen on CT scan at some point once fluid is better managed.  *Coagulopathy:  INR 1.8 most recently.  -Discussed with Dr. Marina Goodell who also saw the patient today.  Will repeat labs today including CBC, CMP, and PT/INR.  I am going to check an ammonia level as well for complaints of recent mild confusion.  Will also set him up for repeat LVP with albumin and fluid sent for culture and cell count.  Continue Lasix 40 mg daily and aldactone 100 mg daily for now.  I will see him back in about 4 weeks at which time we will reassess.  Continue to weigh himself daily, which he has been doing.  2 gram sodium diet; spent a long time discussing this with him at his last visit.  Absolutely needs ETOH abstinence.

## 2015-11-10 NOTE — Progress Notes (Signed)
Agree. VERY advanced liver disease. Continues to drink. Has marked ascites and portal HTN. Plans as outlined. Not suitable for endoscopic evaluations at present. Tenuous prognosis.

## 2015-11-11 ENCOUNTER — Ambulatory Visit (HOSPITAL_COMMUNITY): Admission: RE | Admit: 2015-11-11 | Payer: Medicare Other | Source: Ambulatory Visit

## 2015-11-11 ENCOUNTER — Other Ambulatory Visit: Payer: Self-pay

## 2015-11-11 ENCOUNTER — Ambulatory Visit (HOSPITAL_COMMUNITY)
Admission: RE | Admit: 2015-11-11 | Discharge: 2015-11-11 | Disposition: A | Discharge status: Home / Self Care | Payer: Medicare Other | Source: Ambulatory Visit | Attending: Gastroenterology | Admitting: Gastroenterology

## 2015-11-11 ENCOUNTER — Encounter (HOSPITAL_COMMUNITY)
Admission: RE | Admit: 2015-11-11 | Discharge: 2015-11-11 | Disposition: A | Discharge status: Home / Self Care | Payer: Medicare Other | Source: Ambulatory Visit | Attending: Gastroenterology | Admitting: Gastroenterology

## 2015-11-11 DIAGNOSIS — R188 Other ascites: Secondary | ICD-10-CM | POA: Insufficient documentation

## 2015-11-11 LAB — GRAM STAIN

## 2015-11-11 LAB — BODY FLUID CELL COUNT WITH DIFFERENTIAL
Eos, Fluid: 1 %
LYMPHS FL: 77 %
Monocyte-Macrophage-Serous Fluid: 20 % — ABNORMAL LOW (ref 50–90)
NEUTROPHIL FLUID: 2 % (ref 0–25)
Total Nucleated Cell Count, Fluid: 114 cu mm (ref 0–1000)

## 2015-11-11 MED ORDER — ALBUMIN HUMAN 25 % IV SOLN
62.5000 g | Freq: Once | INTRAVENOUS | Status: AC
  Administered 2015-11-11: 62.5 g via INTRAVENOUS
  Filled 2015-11-11: qty 250

## 2015-11-11 MED ORDER — LIDOCAINE HCL (PF) 1 % IJ SOLN
INTRAMUSCULAR | Status: AC
  Filled 2015-11-11: qty 10

## 2015-11-11 NOTE — Procedures (Addendum)
   US guided RLQ paracentesis  11 liters hazy yellow fluid obtained 30 cc sent to lab per MD  Post procedure IV Albumin per MD 12.5 gr /liter over 6 liters  62.5 gr IV Albumin ordered

## 2015-11-12 ENCOUNTER — Other Ambulatory Visit: Payer: Self-pay | Admitting: *Deleted

## 2015-11-12 ENCOUNTER — Telehealth: Payer: Self-pay | Admitting: *Deleted

## 2015-11-12 DIAGNOSIS — K7031 Alcoholic cirrhosis of liver with ascites: Secondary | ICD-10-CM

## 2015-11-12 MED ORDER — LACTULOSE 10 GM/15ML PO SOLN: 30.0000 g | Freq: Three times a day (TID) | ORAL | Status: DC

## 2015-11-12 NOTE — Telephone Encounter (Signed)
Patient aware.

## 2015-11-12 NOTE — Telephone Encounter (Signed)
We do not give stuff like that.  Needs to try to get in to see a PCP.

## 2015-11-12 NOTE — Telephone Encounter (Signed)
Patient is asking for "nerve medication.:" States he does not have a PCP.

## 2015-11-13 LAB — PATHOLOGIST SMEAR REVIEW

## 2015-11-16 LAB — CULTURE, BODY FLUID W GRAM STAIN -BOTTLE: Culture: NO GROWTH

## 2015-11-16 LAB — CULTURE, BODY FLUID-BOTTLE

## 2015-11-18 ENCOUNTER — Other Ambulatory Visit (INDEPENDENT_AMBULATORY_CARE_PROVIDER_SITE_OTHER): Payer: Medicare Other

## 2015-11-18 ENCOUNTER — Other Ambulatory Visit: Payer: Self-pay | Admitting: *Deleted

## 2015-11-18 DIAGNOSIS — K7031 Alcoholic cirrhosis of liver with ascites: Secondary | ICD-10-CM

## 2015-11-18 LAB — BASIC METABOLIC PANEL
BUN: 12 mg/dL (ref 6–23)
CALCIUM: 8.6 mg/dL (ref 8.4–10.5)
CO2: 22 mEq/L (ref 19–32)
CREATININE: 0.74 mg/dL (ref 0.40–1.50)
Chloride: 102 mEq/L (ref 96–112)
GFR: 115.1 mL/min (ref >60.00)
Glucose, Bld: 167 mg/dL — ABNORMAL HIGH (ref 70–99)
Potassium: 4.3 mEq/L (ref 3.5–5.1)
SODIUM: 128 meq/L — AB (ref 135–145)

## 2015-11-21 ENCOUNTER — Encounter (HOSPITAL_COMMUNITY): Payer: Medicare Other

## 2015-11-21 ENCOUNTER — Encounter: Payer: Self-pay | Admitting: *Deleted

## 2015-11-25 ENCOUNTER — Other Ambulatory Visit (INDEPENDENT_AMBULATORY_CARE_PROVIDER_SITE_OTHER): Payer: Medicare Other

## 2015-11-25 DIAGNOSIS — K7031 Alcoholic cirrhosis of liver with ascites: Secondary | ICD-10-CM

## 2015-11-25 LAB — BASIC METABOLIC PANEL
BUN: 10 mg/dL (ref 6–23)
CHLORIDE: 104 meq/L (ref 96–112)
CO2: 22 meq/L (ref 19–32)
CREATININE: 0.76 mg/dL (ref 0.40–1.50)
Calcium: 8.5 mg/dL (ref 8.4–10.5)
GFR: 111.6 mL/min (ref >60.00)
GLUCOSE: 141 mg/dL — AB (ref 70–99)
Potassium: 3.7 mEq/L (ref 3.5–5.1)
SODIUM: 133 meq/L — AB (ref 135–145)

## 2015-12-02 ENCOUNTER — Ambulatory Visit: Payer: Medicare Other | Admitting: Gastroenterology

## 2015-12-04 ENCOUNTER — Telehealth: Payer: Self-pay | Admitting: Gastroenterology

## 2015-12-04 ENCOUNTER — Other Ambulatory Visit: Payer: Self-pay | Admitting: Gastroenterology

## 2015-12-04 DIAGNOSIS — K7031 Alcoholic cirrhosis of liver with ascites: Secondary | ICD-10-CM

## 2015-12-04 NOTE — Telephone Encounter (Signed)
Left a message for Devin Noble to call back. Patient cancelled his OV on 12/02/15.

## 2015-12-04 NOTE — Telephone Encounter (Signed)
Spoke with patient and he states he thinks he needs a paracentesis. States he did not know a doctor had to order it so he called the hospital to schedule it. He cancelled his OV because he did not want to spend the money to come here. He reports his weight as 221 lbs at this time. Reports his stomach is big and he is a little bit SOB. He is taking his Lasix 20 mg 2 tablets daily and Aldactone 100 mg daily. Reports he is limiting his fluid intake. Explained to patient the OV are important for him to keep due to chronic illness. Please, advise.

## 2015-12-04 NOTE — Telephone Encounter (Signed)
Spoke with Devin Noble and told her to have patient call our office to discuss.

## 2015-12-05 ENCOUNTER — Other Ambulatory Visit: Payer: Self-pay

## 2015-12-05 DIAGNOSIS — R188 Other ascites: Secondary | ICD-10-CM

## 2015-12-05 NOTE — Telephone Encounter (Signed)
Ok to schedule.  Needs to be given albumin since they usually take almost 12 Liters off of him.  He needs to have an office visit in near future and a repeat BMP next week.  I would really like him to see Dr. Marina GoodellPerry in the office sometime because he's never been on his schedule and he needs to emphasize too that he needs to keep the office visits.  Please advise him that we will not be ordering these paracenteses if he does not keep his visits in the future.  Thank you,  Jess

## 2015-12-05 NOTE — Telephone Encounter (Signed)
[facility] at 9[facility]00 AM[facility]. Scheduled OV with Dr. Marina GoodellPerry on 4/1`2/17 at 2:15 PM. Emphasized to patient that he has to keep the appointments or paracentesis will not be ordered.

## 2015-12-08 ENCOUNTER — Other Ambulatory Visit (INDEPENDENT_AMBULATORY_CARE_PROVIDER_SITE_OTHER): Payer: Medicare Other

## 2015-12-08 DIAGNOSIS — K7031 Alcoholic cirrhosis of liver with ascites: Secondary | ICD-10-CM | POA: Diagnosis not present

## 2015-12-08 LAB — BASIC METABOLIC PANEL
BUN: 9 mg/dL (ref 6–23)
CHLORIDE: 98 meq/L (ref 96–112)
CO2: 21 meq/L (ref 19–32)
Calcium: 8.5 mg/dL (ref 8.4–10.5)
Creatinine, Ser: 0.78 mg/dL (ref 0.40–1.50)
GFR: 108.29 mL/min (ref >60.00)
Glucose, Bld: 102 mg/dL — ABNORMAL HIGH (ref 70–99)
POTASSIUM: 4.1 meq/L (ref 3.5–5.1)
Sodium: 130 mEq/L — ABNORMAL LOW (ref 135–145)

## 2015-12-12 ENCOUNTER — Ambulatory Visit (HOSPITAL_COMMUNITY)
Admission: RE | Admit: 2015-12-12 | Discharge: 2015-12-12 | Disposition: A | Discharge status: Home / Self Care | Payer: Medicare Other | Source: Ambulatory Visit | Attending: Gastroenterology | Admitting: Gastroenterology

## 2015-12-12 DIAGNOSIS — R188 Other ascites: Secondary | ICD-10-CM | POA: Diagnosis not present

## 2015-12-12 LAB — BODY FLUID CELL COUNT WITH DIFFERENTIAL
LYMPHS FL: 39 %
Monocyte-Macrophage-Serous Fluid: 59 % (ref 50–90)
Neutrophil Count, Fluid: 2 % (ref 0–25)
WBC FLUID: 56 uL (ref 0–1000)

## 2015-12-12 MED ORDER — ALBUMIN HUMAN 25 % IV SOLN
62.5000 g | Freq: Once | INTRAVENOUS | Status: AC
  Administered 2015-12-12: 62.5 g via INTRAVENOUS
  Filled 2015-12-12: qty 250

## 2015-12-12 MED ORDER — LIDOCAINE HCL (PF) 1 % IJ SOLN
INTRAMUSCULAR | Status: AC
  Filled 2015-12-12: qty 10

## 2015-12-12 NOTE — Procedures (Signed)
Successful US guided paracentesis from RLQ.  Yielded 8L of cloudy yellow fluid.  No immediate complications.  Pt tolerated well.   Specimen was sent for labs.  Brayton ElBRUNING, Kordelia Severin PA-C 12/12/2015 10:17 AM

## 2015-12-15 LAB — PATHOLOGIST SMEAR REVIEW

## 2015-12-16 ENCOUNTER — Telehealth: Payer: Self-pay

## 2015-12-16 NOTE — Telephone Encounter (Signed)
[facility] RX refill request for Folic acid 1mg  1 po daily. Last O/V 11-10-2015. Please advise on refills.

## 2015-12-17 MED ORDER — FOLIC ACID 1 MG PO TABS: 1.0000 mg | ORAL_TABLET | Freq: Every day | ORAL | Status: DC

## 2015-12-17 NOTE — Telephone Encounter (Signed)
Rx approved by Rito EhrlichJ. [name] for one year per a verbal order. Rx sent.

## 2016-01-07 ENCOUNTER — Emergency Department (HOSPITAL_COMMUNITY): Payer: Medicare Other

## 2016-01-07 ENCOUNTER — Encounter (HOSPITAL_COMMUNITY): Payer: Self-pay | Admitting: Emergency Medicine

## 2016-01-07 ENCOUNTER — Inpatient Hospital Stay (HOSPITAL_COMMUNITY)
Admission: EM | Admit: 2016-01-07 | Discharge: 2016-01-09 | DRG: 433 | Disposition: A | Discharge status: Home / Self Care | Payer: Medicare Other | Attending: Internal Medicine | Admitting: Internal Medicine

## 2016-01-07 ENCOUNTER — Encounter: Payer: Self-pay | Admitting: Internal Medicine

## 2016-01-07 ENCOUNTER — Ambulatory Visit (INDEPENDENT_AMBULATORY_CARE_PROVIDER_SITE_OTHER): Payer: Medicare Other | Admitting: Internal Medicine

## 2016-01-07 VITALS — BP 116/90 | HR 120 | Ht 71.0 in | Wt 243.4 lb

## 2016-01-07 DIAGNOSIS — K766 Portal hypertension: Secondary | ICD-10-CM | POA: Diagnosis present

## 2016-01-07 DIAGNOSIS — F1010 Alcohol abuse, uncomplicated: Secondary | ICD-10-CM | POA: Diagnosis present

## 2016-01-07 DIAGNOSIS — R000 Tachycardia, unspecified: Secondary | ICD-10-CM | POA: Diagnosis not present

## 2016-01-07 DIAGNOSIS — R188 Other ascites: Secondary | ICD-10-CM | POA: Diagnosis not present

## 2016-01-07 DIAGNOSIS — D689 Coagulation defect, unspecified: Secondary | ICD-10-CM | POA: Diagnosis present

## 2016-01-07 DIAGNOSIS — K7031 Alcoholic cirrhosis of liver with ascites: Principal | ICD-10-CM | POA: Diagnosis present

## 2016-01-07 DIAGNOSIS — G2581 Restless legs syndrome: Secondary | ICD-10-CM | POA: Diagnosis present

## 2016-01-07 DIAGNOSIS — R0602 Shortness of breath: Secondary | ICD-10-CM | POA: Diagnosis not present

## 2016-01-07 DIAGNOSIS — K219 Gastro-esophageal reflux disease without esophagitis: Secondary | ICD-10-CM | POA: Diagnosis present

## 2016-01-07 DIAGNOSIS — Z96612 Presence of left artificial shoulder joint: Secondary | ICD-10-CM | POA: Diagnosis present

## 2016-01-07 DIAGNOSIS — Z96611 Presence of right artificial shoulder joint: Secondary | ICD-10-CM | POA: Diagnosis present

## 2016-01-07 DIAGNOSIS — K7040 Alcoholic hepatic failure without coma: Secondary | ICD-10-CM | POA: Diagnosis present

## 2016-01-07 DIAGNOSIS — I10 Essential (primary) hypertension: Secondary | ICD-10-CM | POA: Diagnosis present

## 2016-01-07 DIAGNOSIS — K7030 Alcoholic cirrhosis of liver without ascites: Secondary | ICD-10-CM

## 2016-01-07 DIAGNOSIS — R05 Cough: Secondary | ICD-10-CM | POA: Diagnosis not present

## 2016-01-07 DIAGNOSIS — D509 Iron deficiency anemia, unspecified: Secondary | ICD-10-CM | POA: Diagnosis present

## 2016-01-07 DIAGNOSIS — E871 Hypo-osmolality and hyponatremia: Secondary | ICD-10-CM | POA: Diagnosis present

## 2016-01-07 DIAGNOSIS — K7460 Unspecified cirrhosis of liver: Secondary | ICD-10-CM | POA: Diagnosis present

## 2016-01-07 LAB — COMPREHENSIVE METABOLIC PANEL
ALBUMIN: 2.8 g/dL — AB (ref 3.5–5.0)
ALT: 29 U/L (ref 17–63)
AST: 60 U/L — AB (ref 15–41)
Alkaline Phosphatase: 89 U/L (ref 38–126)
Anion gap: 7 (ref 5–15)
BUN: 16 mg/dL (ref 6–20)
CHLORIDE: 103 mmol/L (ref 101–111)
CO2: 19 mmol/L — AB (ref 22–32)
CREATININE: 0.88 mg/dL (ref 0.61–1.24)
Calcium: 8.5 mg/dL — ABNORMAL LOW (ref 8.9–10.3)
GFR calc Af Amer: >60 mL/min (ref >60)
GFR calc non Af Amer: >60 mL/min (ref >60)
GLUCOSE: 135 mg/dL — AB (ref 65–99)
Potassium: 4.3 mmol/L (ref 3.5–5.1)
SODIUM: 129 mmol/L — AB (ref 135–145)
Total Bilirubin: 2.8 mg/dL — ABNORMAL HIGH (ref 0.3–1.2)
Total Protein: 7.3 g/dL (ref 6.5–8.1)

## 2016-01-07 LAB — CBC WITH DIFFERENTIAL/PLATELET
Basophils Absolute: 0.1 10*3/uL (ref 0.0–0.1)
Basophils Relative: 2 %
Eosinophils Absolute: 0.2 10*3/uL (ref 0.0–0.7)
Eosinophils Relative: 5 %
HEMATOCRIT: 26.8 % — AB (ref 39.0–52.0)
HEMOGLOBIN: 9.1 g/dL — AB (ref 13.0–17.0)
LYMPHS ABS: 0.7 10*3/uL (ref 0.7–4.0)
Lymphocytes Relative: 16 %
MCH: 26.4 pg (ref 26.0–34.0)
MCHC: 34 g/dL (ref 30.0–36.0)
MCV: 77.7 fL — ABNORMAL LOW (ref 78.0–100.0)
MONOS PCT: 28 %
Monocytes Absolute: 1.1 10*3/uL — ABNORMAL HIGH (ref 0.1–1.0)
NEUTROS ABS: 2 10*3/uL (ref 1.7–7.7)
NEUTROS PCT: 49 %
Platelets: 187 10*3/uL (ref 150–400)
RBC: 3.45 MIL/uL — AB (ref 4.22–5.81)
RDW: 16.8 % — ABNORMAL HIGH (ref 11.5–15.5)
WBC: 4 10*3/uL (ref 4.0–10.5)

## 2016-01-07 LAB — PROTIME-INR
INR: 1.77 — AB (ref 0.00–1.49)
Prothrombin Time: 20.5 seconds — ABNORMAL HIGH (ref 11.6–15.2)

## 2016-01-07 LAB — TYPE AND SCREEN
ABO/RH(D): B POS
Antibody Screen: NEGATIVE

## 2016-01-07 LAB — LIPASE, BLOOD: Lipase: 29 U/L (ref 11–51)

## 2016-01-07 LAB — TROPONIN I: Troponin I: <0.03 ng/mL (ref <0.031)

## 2016-01-07 LAB — ETHANOL: Alcohol, Ethyl (B): <5 mg/dL (ref <5)

## 2016-01-07 MED ORDER — SPIRONOLACTONE 100 MG PO TABS
100.0000 mg | ORAL_TABLET | Freq: Every day | ORAL | Status: DC
  Administered 2016-01-08: 100 mg via ORAL
  Filled 2016-01-07 (×2): qty 1

## 2016-01-07 MED ORDER — SODIUM CHLORIDE 0.9% FLUSH
3.0000 mL | INTRAVENOUS | Status: DC | PRN
  Administered 2016-01-08: 3 mL via INTRAVENOUS
  Filled 2016-01-07: qty 3

## 2016-01-07 MED ORDER — MORPHINE SULFATE (PF) 2 MG/ML IV SOLN: 2.0000 mg | INTRAVENOUS | Status: DC | PRN

## 2016-01-07 MED ORDER — PANTOPRAZOLE SODIUM 40 MG PO TBEC
40.0000 mg | DELAYED_RELEASE_TABLET | Freq: Every day | ORAL | Status: DC
  Administered 2016-01-08 – 2016-01-09 (×2): 40 mg via ORAL
  Filled 2016-01-07 (×2): qty 1

## 2016-01-07 MED ORDER — OXYCODONE HCL 5 MG PO TABS: 5.0000 mg | ORAL_TABLET | ORAL | Status: DC | PRN

## 2016-01-07 MED ORDER — ONDANSETRON HCL 4 MG PO TABS: 4.0000 mg | ORAL_TABLET | Freq: Four times a day (QID) | ORAL | Status: DC | PRN

## 2016-01-07 MED ORDER — SODIUM CHLORIDE 0.9 % IV SOLN
250.0000 mL | INTRAVENOUS | Status: DC | PRN
  Administered 2016-01-07: 250 mL via INTRAVENOUS

## 2016-01-07 MED ORDER — ONDANSETRON HCL 4 MG/2ML IJ SOLN: 4.0000 mg | Freq: Four times a day (QID) | INTRAMUSCULAR | Status: DC | PRN

## 2016-01-07 MED ORDER — ALBUMIN HUMAN 25 % IV SOLN
50.0000 g | Freq: Once | INTRAVENOUS | Status: AC
  Administered 2016-01-07: 50 g via INTRAVENOUS
  Filled 2016-01-07: qty 200

## 2016-01-07 MED ORDER — SODIUM CHLORIDE 0.9% FLUSH
3.0000 mL | Freq: Two times a day (BID) | INTRAVENOUS | Status: DC
  Administered 2016-01-07 – 2016-01-09 (×3): 3 mL via INTRAVENOUS

## 2016-01-07 MED ORDER — FUROSEMIDE 40 MG PO TABS
40.0000 mg | ORAL_TABLET | Freq: Every day | ORAL | Status: DC
  Administered 2016-01-08: 40 mg via ORAL
  Filled 2016-01-07 (×2): qty 1

## 2016-01-07 NOTE — Patient Instructions (Signed)
You have been [facility]

## 2016-01-07 NOTE — ED Provider Notes (Signed)
CSN: [id number]     Arrival date & time 01/07/16  1523 History   First MD Initiated Contact with Patient 01/07/16 1602     Chief Complaint  Patient presents with  . Shortness of Breath  . Ascites     (Consider location/radiation/quality/duration/timing/severity/associated sxs/prior Treatment) HPI Patient reports sent by GI for admission. States his abdominal ascites has gotten severe and it is making him short of breath. Reports he needed paracentesis 2 weeks ago. He gets chills but no fever. No vomiting. Abdominal pressure without pain. Patient quit drinking alcohol 3 months ago. Past Medical History  Diagnosis Date  . Hypertension     takes Lisinopril/HCTZ daily  . Birthmark of skin     "right eye" (01/30/2013)  . GERD (gastroesophageal reflux disease)     seldom  . Arthritis   . Cirrhosis (HCC)   . Abdominal hernia   . Restless leg syndrome    Past Surgical History  Procedure Laterality Date  . 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 Colonoscopy  ~ 2009  . Total shoulder arthroplasty Left 01/30/2013    Procedure: TOTAL SHOULDER ARTHROPLASTY;  Surgeon: Mable ParisJustin William Chandler, MD;  [facility];  Service: Orthopedics;  Laterality: Left;  . Total shoulder arthroplasty Right 05/24/2013    Dr Ave Filterhandler  . Total shoulder arthroplasty Right 05/24/2013    Procedure: RIGHT TOTAL SHOULDER ARTHROPLASTY;  Surgeon: Mable ParisJustin William Chandler, MD;  [facility];  Service: Orthopedics;  Laterality: Right;  Right total shoulder replacement   Family History  Problem Relation Age of Onset  . Colon cancer Neg Hx   . Colon polyps Neg Hx   . Kidney disease Neg Hx   . Diabetes Neg Hx   . Gallbladder disease Mother   . Esophageal cancer Neg Hx   . Heart disease Neg Hx    Social History  Substance Use Topics  . Smoking status: Never Smoker   . Smokeless tobacco: Never Used  . Alcohol Use: 14.4 oz/week    24 Cans of beer per week     Comment: daily beer    Review of Systems  10  Systems reviewed and are negative for acute change except as noted in the HPI.  Allergies  Tramadol  Home Medications   Prior to Admission medications   Medication Sig Start Date End Date Taking? Authorizing Provider  [facility] B-1 100 MG tablet Take 1 tablet by mouth daily. 12/12/15  Yes Historical Provider, MD  feeding supplement, ENSURE ENLIVE, (ENSURE ENLIVE) LIQD Take 237 mLs by mouth daily. 10/03/15  Yes Simbiso Ranga, MD  folic acid (FOLVITE) 1 MG tablet Take 1 tablet (1 mg total) by mouth daily. 12/17/15  Yes [name] [name], PA-C  furosemide (LASIX) 20 MG tablet Take 2 tablets (40 mg total) by mouth daily. 11/10/15  Yes [name] [name], PA-C  pantoprazole (PROTONIX) 40 MG tablet Take 1 tablet (40 mg total) by mouth daily. 11/10/15  Yes [name] [name], PA-C  spironolactone (ALDACTONE) 100 MG tablet Take 1 tablet (100 mg total) by mouth daily. 11/10/15  Yes [name] [name], PA-C   BP 136/97 mmHg  Pulse 114  Temp(Src) 97.5 F (36.4 C) (Oral)  Resp 32 Physical Exam  Constitutional: He is oriented to person, place, and time.  Deconditioned, nontoxic, clear MS. No resp distress at rest.  HENT:  Head: Normocephalic and atraumatic.  Mouth/Throat: Oropharynx is clear and moist.  Birth mark hyperpigmentation right outer eyelid. Chronic ectropion.   Eyes: EOM are  normal. Pupils are equal, round, and reactive to light. No scleral icterus.  Neck: Neck supple.  Cardiovascular: Normal rate, regular rhythm, normal heart sounds and intact distal pulses.   Pulmonary/Chest: Effort normal and breath sounds normal.  Abdominal:  Extreme ascites with tense distension. Mild, diffuse erythema of abdominal wall. Not sig tender to palpation.  Musculoskeletal: Normal range of motion. He exhibits edema. He exhibits no tenderness.  Neurological: He is alert and oriented to person, place, and time. He has normal strength. Coordination normal. GCS eye subscore is 4. GCS verbal subscore is 5. GCS motor subscore is 6.   Skin: Skin is warm, dry and intact.  Psychiatric: He has a normal mood and affect.    ED Course  Procedures (including critical care time) Labs Review Labs Reviewed  CULTURE, BLOOD (ROUTINE X 2)  CULTURE, BLOOD (ROUTINE X 2)  COMPREHENSIVE METABOLIC PANEL  ETHANOL  LIPASE, BLOOD  TROPONIN I  CBC WITH DIFFERENTIAL/PLATELET  PROTIME-INR  URINALYSIS, ROUTINE W REFLEX MICROSCOPIC (NOT AT [facility])  URINE RAPID DRUG SCREEN, HOSP PERFORMED  TYPE AND SCREEN    Imaging Review No results found. I have personally reviewed and evaluated these images and lab results as part of my medical decision-making.   EKG Interpretation None     Consult: Hospitalist for admission MDM   Final diagnoses:  Alcoholic cirrhosis of liver with ascites (HCC)   Patient nontoxic and alert. No acute respiratory distress. Extremely tense ascites with thoracic volume impingement. Will admit as per GI recommendation.   Arby Barrette, MD 01/07/16 [phone]

## 2016-01-07 NOTE — ED Notes (Signed)
Patient here from home with SOB and abdominal swelling. Hx of cirrhosis of liver.

## 2016-01-07 NOTE — H&P (Signed)
[facility] History and Physical  UCHECHUKWU DHAWAN ZOX:[id number] DOB: 05-14-1956 DOA: 01/07/2016  Referring physician:  PCP: No PCP Per Patient   Chief Complaint: Shortness of breath  HPI: KIEREN ADKISON is a 60 y.o. male of a past medical history of advanced alcoholic liver disease, portal hypertension, having history of ascites requiring large-volume paracentesis. On 10/01/2015 he underwent ultrasound-guided paracentesis removing 10.6 L of ascitic fluid. Ultrasound-guided paracentesis on 12/12/2015 yielded 8 L of ascitic fluid. He presented to [facility] this morning complaining of worsening abdominal distention leading to the development of significant shortness of breath. He states he was supposed to have a paracentesis done several weeks ago. He denies productive cough, fevers, chills, nausea, vomiting. Reporting quit drinking approximately 3 months ago. He was seen and evaluated by Dr. Marina Goodell of GI who recommended hospitalization for large-volume therapeutic paracentesis. Mr Kowalke appears dyspneic and uncomfortable in the emergency department.                                       Review of Systems:  Constitutional:  No weight loss, night sweats, Fevers, positive for fatigue and chills.  HEENT:  No headaches, Difficulty swallowing,Tooth/dental problems,Sore throat,  No sneezing, itching, ear ache, nasal congestion, post nasal drip,  Cardio-vascular:  No chest pain, Orthopnea, PND, swelling in lower extremities, anasarca, dizziness, palpitations  GI:  No heartburn, indigestion, abdominal pain, nausea, vomiting, diarrhea, change in bowel habits, loss of appetite, positive for abdominal distention.  Resp:  Positive for shortness of breath with exertion or at rest. No excess mucus, no productive cough, No non-productive cough, No coughing up of blood.No change in color of mucus.No wheezing.No chest wall deformity  Skin:  no rash or lesions.  GU:  no dysuria, change in color of  urine, no urgency or frequency. No flank pain.  Musculoskeletal:  No joint pain or swelling. No decreased range of motion. No back pain.  Psych:  No change in mood or affect. No depression or anxiety. No memory loss.   Past Medical History  Diagnosis Date  . Hypertension     takes Lisinopril/HCTZ daily  . Birthmark of skin     "right eye" (01/30/2013)  . GERD (gastroesophageal reflux disease)     seldom  . Arthritis   . Cirrhosis (HCC)   . Abdominal hernia   . Restless leg syndrome    Past Surgical History  Procedure Laterality Date  . 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 Colonoscopy  ~ 2009  . Total shoulder arthroplasty Left 01/30/2013    Procedure: TOTAL SHOULDER ARTHROPLASTY;  Surgeon: Mable Paris, MD;  Location: [facility] OR;  Service: Orthopedics;  Laterality: Left;  . Total shoulder arthroplasty Right 05/24/2013    Dr Ave Filter  . Total shoulder arthroplasty Right 05/24/2013    Procedure: RIGHT TOTAL SHOULDER ARTHROPLASTY;  Surgeon: Mable Paris, MD;  Location: [facility] OR;  Service: Orthopedics;  Laterality: Right;  Right total shoulder replacement   Social History:  reports that he has never smoked. He has never used smokeless tobacco. He reports that he drinks about 14.4 oz of alcohol per week. He reports that he does not use illicit drugs.  Allergies  Allergen Reactions  . Tramadol Hives and Itching    Family History  Problem Relation Age of Onset  . Colon cancer Neg Hx   . Colon polyps Neg Hx   . Kidney  disease Neg Hx   . Diabetes Neg Hx   . Gallbladder disease Mother   . Esophageal cancer Neg Hx   . Heart disease Neg Hx     Prior to Admission medications   Medication Sig Start Date End Date Taking? Authorizing Provider  [employer] B-1 100 MG tablet Take 1 tablet by mouth daily. 12/12/15  Yes Historical Provider, MD  feeding supplement, ENSURE ENLIVE, (ENSURE ENLIVE) LIQD Take 237 mLs by mouth daily. 10/03/15  Yes Simbiso Ranga, MD  folic acid  (FOLVITE) 1 MG tablet Take 1 tablet (1 mg total) by mouth daily. 12/17/15  Yes [name] [name], PA-C  furosemide (LASIX) 20 MG tablet Take 2 tablets (40 mg total) by mouth daily. 11/10/15  Yes [name] [name], PA-C  pantoprazole (PROTONIX) 40 MG tablet Take 1 tablet (40 mg total) by mouth daily. 11/10/15  Yes [name] [name], PA-C  spironolactone (ALDACTONE) 100 MG tablet Take 1 tablet (100 mg total) by mouth daily. 11/10/15  Yes Leta Baptist, PA-C   Physical Exam: Filed Vitals:   01/07/16 1531 01/07/16 1639  BP: 136/97 121/95  Pulse: 114 112  Temp: 97.5 F (36.4 C)   TempSrc: Oral   Resp: 32 29  SpO2:  95%    Wt Readings from Last 3 Encounters:  01/07/16 110.394 kg (243 lb 6 oz)  12/12/15 93.441 kg (206 lb)  11/11/15 93.895 kg (207 lb)    General:  He appears uncomfortable, having dyspnea at rest, otherwise mentating well awake and alert oriented 3 Eyes: PERRL, normal lids, irises & conjunctiva, scleral icterus present ENT: grossly normal hearing, lips & tongue, dry oral mucosa Neck: no LAD, masses or thyromegaly Cardiovascular: RRR, no m/r/g. Has 2-3 + pitting edema Telemetry: SR, no arrhythmias  Respiratory: CTA bilaterally, no w/r/r. Normal respiratory effort. Abdomen: massive abdominal distention, there was no significant tenderness to palpation Skin: no rash or induration seen on limited exam Musculoskeletal: grossly normal tone BUE/BLE Psychiatric: grossly normal mood and affect, speech fluent and appropriate Neurologic: grossly non-focal.          Labs on Admission:  Basic Metabolic Panel: No results for input(s): NA, K, CL, CO2, GLUCOSE, BUN, CREATININE, CALCIUM, MG, PHOS in the last 168 hours. Liver Function Tests: No results for input(s): AST, ALT, ALKPHOS, BILITOT, PROT, ALBUMIN in the last 168 hours. No results for input(s): LIPASE, AMYLASE in the last 168 hours. No results for input(s): AMMONIA in the last 168 hours. CBC:  Recent Labs Lab 01/07/16 1625  WBC  4.0  NEUTROABS 2.0  HGB 9.1*  HCT 26.8*  MCV 77.7*  PLT 187   Cardiac Enzymes: No results for input(s): CKTOTAL, CKMB, CKMBINDEX, TROPONINI in the last 168 hours.  BNP (last 3 results) No results for input(s): BNP in the last 8760 hours.  ProBNP (last 3 results) No results for input(s): PROBNP in the last 8760 hours.  CBG: No results for input(s): GLUCAP in the last 168 hours.  Radiological Exams on Admission: Dg Chest Port 1 View  01/07/2016  CLINICAL DATA:  Extreme shortness of breath with wheezing and cough for 2 days. History of cirrhosis with portal hypertension. EXAM: PORTABLE CHEST 1 VIEW COMPARISON:  09/30/2015 and 01/04/2015 radiographs. Abdominal CT 01/04/2015. FINDINGS: 1654 hours. Two views obtained. Overall lower lung volumes. The left pleural effusion has enlarged with increasing left basilar pulmonary opacity. There is no significant pleural fluid on the right. The right lung is clear for the degree of inspiration. The visualized heart  size and mediastinal contours are stone. Patient is status post bilateral total shoulder arthroplasty. IMPRESSION: Enlarging left pleural effusion with worsening left basilar airspace disease. Consider communication between the left pleural space and peritoneal cavity in this patient with a history of cirrhosis and ascites. Electronically Signed   By: Carey BullocksWilliam  Veazey M.D.   On: 01/07/2016 17:12    EKG: Independently reviewed.   Assessment/Plan Principal Problem:   Alcoholic liver failure (HCC) Active Problems:   Cirrhosis of liver with ascites (HCC)   Ascites   Anemia, iron deficiency   Alcohol abuse   1. Alcoholic liver disease. Mr [name] is a pleasant 60 year old gentleman with a past medical history of alcohol abuse, cirrhosis, portal hypertension, ascites undergoing recent ultrasound-guided paracentesis on 10/01/2015 and 12/12/2015 where 10.6 and 8 L of ascitic fluid were removed respectively. He presents with massive abdominal  distention likely leading to shortness of breath. He was seen earlier at [facility] by Dr Marina GoodellPerry who recommended hospitalization for large volume paracentesis. Interventional radiology was consulted for ultrasound-guided paracentesis. Fluid to be sent for analysis. On exam he did not appear to have significant abdominal pain. He did have some discomfort likely related to severe distention. Plan to continue his spironolactone 100 mg by mouth daily and Lasix 40 mg by mouth daily. Will check a comprehensive metabolic panel along with PT/INR. Will give 50 g of IV albumin prior to large volume paracentesis. 2. History of alcohol abuse. He reports remaining compliant to alcohol abstention reporting his last drink was about 3 weeks ago. 3. Coagulopathy. Likely secondary to advanced liver disease. Lab work showing INR 1.77 which is at his baseline 4. DVT prophylaxis. SCD  Code Status: Full code Family Communication: Family not present Disposition Plan: Anticipate patient may need greater than 2 nights hospitalization  Time spent: 65 minutes  Jeralyn BennettZAMORA, Kelin Borum [facility] Pager [phone]

## 2016-01-07 NOTE — Progress Notes (Signed)
HISTORY OF PRESENT ILLNESS:  Devin Noble is a 60 y.o. male with advanced alcoholic cirrhosis with portal hypertension and recurrent ascites who was introduced to GI in the hospital as an unassigned patient January 2016. Was seen in the office on 2 occasions for follow-up. Most recently 11/10/2015. Had been noncompliant with alcoholic abstinence until that point. Principal problem has been overall debility and symptomatically ascites. Has had several large volume paracenteses. Mr. scheduled follow-up appointment. Has cirrhosis with portal hypertension on imaging. Last basic metabolic panel 1 month ago with sodium 130, BUN 9, creatinine 0.78, potassium 4.1. Last CBC in February with hemoglobin 11.1 and platelets 224,000. INR was 1.7. His chief complaint today is abdominal discomfort related to progressive ascites with associated difficulty breathing. He looks much worse in last visit. No obvious problems with encephalopathy or GI bleeding. He has abandoned his PCP, Dr. Jeannetta Noble. He tells me that he has been compliant with Lasix 40 mg daily and Aldactone 100 mg daily. He also tells me that he has been compliant with low sodium diet and abstinence from alcohol.  REVIEW OF SYSTEMS:  All non-GI ROS negative except for cough, depression, fatigue, itching, muscle cramps, night sweats, skin rashes, sleeping problems, leg swelling, increased thirst, shortness of breath  Past Medical History  Diagnosis Date  . Hypertension     takes Lisinopril/HCTZ daily  . Birthmark of skin     "right eye" (01/30/2013)  . GERD (gastroesophageal reflux disease)     seldom  . Arthritis   . Cirrhosis (HCC)   . Abdominal hernia   . Restless leg syndrome     Past Surgical History  Procedure Laterality Date  . 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 Colonoscopy  ~ 2009  . Total shoulder arthroplasty Left 01/30/2013    Procedure: TOTAL SHOULDER ARTHROPLASTY;  Surgeon: Mable Paris, MD;  Location: [facility] OR;   Service: Orthopedics;  Laterality: Left;  . Total shoulder arthroplasty Right 05/24/2013    Dr Ave Filter  . Total shoulder arthroplasty Right 05/24/2013    Procedure: RIGHT TOTAL SHOULDER ARTHROPLASTY;  Surgeon: Mable Paris, MD;  Location: [facility] OR;  Service: Orthopedics;  Laterality: Right;  Right total shoulder replacement    Social [name]  reports that he has never smoked. He has never used smokeless tobacco. He reports that he drinks about 14.4 oz of alcohol per week. He reports that he does not use illicit drugs.  family history includes Gallbladder disease in his mother. There is no history of Colon cancer, Colon polyps, Kidney disease, Diabetes, Esophageal cancer, or Heart disease.  Allergies  Allergen Reactions  . Tramadol Hives       PHYSICAL EXAMINATION: Vital signs: BP 116/90 mmHg  Pulse 120  Ht  (1.803 m)  Wt 243 lb 6 oz (110.394 kg)  BMI 33.96 kg/m2  Constitutional: Chronically ill-appearing, no acute distress but obvious difficulty breathing with movement Psychiatric: alert and oriented x3, cooperative Eyes: extraocular movements intact, anicteric, conjunctiva pink right eye with birthmark and lid drooping Mouth: oral pharynx moist, no lesions Neck: supple without thyromegaly Lymph: no lymphadenopathy Cardiovascular: Rapid heart rate and regular rhythm, no murmur Lungs: clear to auscultation bilaterally Abdomen: Markedly distended with massive ascites anCaput Medusa, mildly uncomfortable with palpation, no peritoneal signs, normal bowel sounds, no organomegaly (cannot be assessed) Rectal: Omitted Extremities: 2+ lower extremity edema bilaterally to the shins Skin: Multiple excoriations on the abdomen. Ecchymoses on the arms Neuro: No focal deficits. Cranial nerves intact.  Slightly decreased DTRs. No asterixis.  ASSESSMENT:  #1. Advanced alcoholic cirrhosis with portal hypertension. Looks poorly with resting tachycardia #2. Massive  ascites with associated shortness of breath #3. History of hyponatremia   PLAN:  #1. The patient needs to be admitted to the hospital for large volume therapeutic paracentesis to help with respiratory difficulties #2. Paracentesis should also be performed to rule out SBP particular given tachycardia #3. He will need reassessment of his laboratories including CBC, comprehensive metabolic panel, PT/INR #4. He will need strict instruction regarding 2 g sodium diet, free fluid restriction, ongoing absence from alcohol, and establishment with PCP for general medical needs #5. GI will be available inpatient if needed. We did contact the hospitalist service who recommended that the patient go through the emergency room given his tachycardia. We advised the patient that he be transported by ambulance. However, he declined, understanding risks, citing cost.

## 2016-01-08 ENCOUNTER — Inpatient Hospital Stay (HOSPITAL_COMMUNITY): Payer: Medicare Other

## 2016-01-08 DIAGNOSIS — K7031 Alcoholic cirrhosis of liver with ascites: Principal | ICD-10-CM

## 2016-01-08 DIAGNOSIS — F1010 Alcohol abuse, uncomplicated: Secondary | ICD-10-CM

## 2016-01-08 LAB — BODY FLUID CELL COUNT WITH DIFFERENTIAL
EOS FL: 1 %
LYMPHS FL: 38 %
MONOCYTE-MACROPHAGE-SEROUS FLUID: 56 % (ref 50–90)
NEUTROPHIL FLUID: 5 % (ref 0–25)
WBC FLUID: 58 uL (ref 0–1000)

## 2016-01-08 LAB — BASIC METABOLIC PANEL
Anion gap: 7 (ref 5–15)
BUN: 15 mg/dL (ref 6–20)
CO2: 22 mmol/L (ref 22–32)
Calcium: 8.6 mg/dL — ABNORMAL LOW (ref 8.9–10.3)
Chloride: 101 mmol/L (ref 101–111)
Creatinine, Ser: 0.79 mg/dL (ref 0.61–1.24)
GFR calc Af Amer: >60 mL/min (ref >60)
GLUCOSE: 93 mg/dL (ref 65–99)
POTASSIUM: 4.3 mmol/L (ref 3.5–5.1)
Sodium: 130 mmol/L — ABNORMAL LOW (ref 135–145)

## 2016-01-08 LAB — GRAM STAIN

## 2016-01-08 LAB — CBC
HEMATOCRIT: 23.6 % — AB (ref 39.0–52.0)
Hemoglobin: 7.9 g/dL — ABNORMAL LOW (ref 13.0–17.0)
MCH: 26.3 pg (ref 26.0–34.0)
MCHC: 33.5 g/dL (ref 30.0–36.0)
MCV: 78.7 fL (ref 78.0–100.0)
Platelets: 170 10*3/uL (ref 150–400)
RBC: 3 MIL/uL — ABNORMAL LOW (ref 4.22–5.81)
RDW: 16.9 % — AB (ref 11.5–15.5)
WBC: 2.6 10*3/uL — ABNORMAL LOW (ref 4.0–10.5)

## 2016-01-08 LAB — LACTATE DEHYDROGENASE, PLEURAL OR PERITONEAL FLUID: LD, Fluid: 26 U/L — ABNORMAL HIGH (ref 3–23)

## 2016-01-08 LAB — GLUCOSE, SEROUS FLUID: Glucose, Fluid: 107 mg/dL

## 2016-01-08 NOTE — Consult Note (Signed)
Referring Provider: No ref. provider found Primary Care Physician:  No PCP Per Patient Primary Gastroenterologist:  Dr. Marina Goodell  Reason for Consultation:  Massive ascites, cirrhosis  HPI: Devin Noble is a 60 y.o. male with advanced alcoholic cirrhosis with portal hypertension and recurrent ascites who was most recently seen by GI on 11/10/2015 and then was there again to see Dr. Marina Goodell yesterday, 4/12, at which time he was sent to the ED for massive ascites and tachycardia with SOB.  Has had several large volume paracenteses, no SBP.  Is currently on lasix 40 mg daily, spironolactone 100 mg daily, and tries to comply with 2 grams sodium diet and fluid restriction.  Has some coagulopathy and hyponatremia.  Last ETOH was about 2 months ago when he had one beer.  Just of note, he has an acute on chronic anemia.  Was transfused with PRBC's at last hospital admission in January, but has never undergone EGD for evaluation at this point.  IR has been consulted for LVP with albumin and fluid studies.    Past Medical History  Diagnosis Date  . Hypertension     takes Lisinopril/HCTZ daily  . Birthmark of skin     "right eye" (01/30/2013)  . GERD (gastroesophageal reflux disease)     seldom  . Arthritis   . Cirrhosis (HCC)   . Abdominal hernia   . Restless leg syndrome     Past Surgical History  Procedure Laterality Date  . 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 Colonoscopy  ~ 2009  . Total shoulder arthroplasty Left 01/30/2013    Procedure: TOTAL SHOULDER ARTHROPLASTY;  Surgeon: Mable Paris, MD;  Location: [facility] OR;  Service: Orthopedics;  Laterality: Left;  . Total shoulder arthroplasty Right 05/24/2013    Dr Ave Filter  . Total shoulder arthroplasty Right 05/24/2013    Procedure: RIGHT TOTAL SHOULDER ARTHROPLASTY;  Surgeon: Mable Paris, MD;  Location: [facility] OR;  Service: Orthopedics;  Laterality: Right;  Right total shoulder replacement    Prior to Admission  medications   Medication Sig Start Date End Date Taking? Authorizing Provider  [employer]S B-1 100 MG tablet Take 1 tablet by mouth daily. 12/12/15  Yes Historical Provider, MD  feeding supplement, ENSURE ENLIVE, (ENSURE ENLIVE) LIQD Take 237 mLs by mouth daily. 10/03/15  Yes Simbiso Ranga, MD  folic acid (FOLVITE) 1 MG tablet Take 1 tablet (1 mg total) by mouth daily. 12/17/15  Yes Wretha Laris D Caige Almeda, PA-C  furosemide (LASIX) 20 MG tablet Take 2 tablets (40 mg total) by mouth daily. 11/10/15  Yes Sharleen Szczesny D Sameeha Rockefeller, PA-C  pantoprazole (PROTONIX) 40 MG tablet Take 1 tablet (40 mg total) by mouth daily. 11/10/15  Yes Jenette Rayson D Clemie General, PA-C  spironolactone (ALDACTONE) 100 MG tablet Take 1 tablet (100 mg total) by mouth daily. 11/10/15  Yes Princella Pellegrini Taliya Mcclard, PA-C    Current Facility-Administered Medications  Medication Dose Route Frequency Provider Last Rate Last Dose  . 0.9 %  sodium chloride infusion  250 mL Intravenous PRN Jeralyn Bennett, MD 0 mL/hr at 01/07/16 1850 250 mL at 01/07/16 1850  . furosemide (LASIX) tablet 40 mg  40 mg Oral Daily Jeralyn Bennett, MD      . morphine 2 MG/ML injection 2 mg  2 mg Intravenous Q4H PRN Jeralyn Bennett, MD      . ondansetron (ZOFRAN) tablet 4 mg  4 mg Oral Q6H PRN Jeralyn Bennett, MD       Or  . ondansetron [facility]) injection 4  mg  4 mg Intravenous Q6H PRN Jeralyn Bennett, MD      . oxyCODONE (Oxy IR/ROXICODONE) immediate release tablet 5 mg  5 mg Oral Q4H PRN Jeralyn Bennett, MD      . pantoprazole (PROTONIX) EC tablet 40 mg  40 mg Oral Daily Jeralyn Bennett, MD      . sodium chloride flush (NS) 0.9 % injection 3 mL  3 mL Intravenous Q12H Jeralyn Bennett, MD   3 mL at 01/07/16 2148  . sodium chloride flush (NS) 0.9 % injection 3 mL  3 mL Intravenous PRN Jeralyn Bennett, MD      . spironolactone (ALDACTONE) tablet 100 mg  100 mg Oral Daily Jeralyn Bennett, MD        Allergies as of 01/07/2016 - Review Complete 01/07/2016  Allergen Reaction Noted  . Tramadol Hives and Itching  05/24/2013    Family History  Problem Relation Age of Onset  . Colon cancer Neg Hx   . Colon polyps Neg Hx   . Kidney disease Neg Hx   . Diabetes Neg Hx   . Gallbladder disease Mother   . Esophageal cancer Neg Hx   . Heart disease Neg Hx     Social History   Social History  . Marital Status: Single    Spouse Name: N/A  . Number of Children: 0  . Years of Education: N/A   Occupational History  . Diability    Social History Main Topics  . Smoking status: Never Smoker   . Smokeless tobacco: Never Used  . Alcohol Use: 14.4 oz/week    24 Cans of beer per week     Comment: daily beer  . Drug Use: No  . Sexual Activity: Not Currently   Other Topics Concern  . Not on file   Social History Narrative   Worked previously as a Emergency planning/management officer. He was forced into retirement/fired. When he was unable to perform his work because of shoulder issues.   His social support system includes a 76 something-year-old friend who does not drive and her children, most of whom work during the day. However one of these children is caring for the patient's dog while he is in the hospital.( Dated 09/2015)    Review of Systems: Ten point ROS is O/W negative except as mentioned in HPI.  Physical Exam: Vital signs in last 24 hours: Temp:  [97.5 F (36.4 C)-98.1 F (36.7 C)] 97.6 F (36.4 C) (04/13 0800) Pulse Rate:  [102-120] 103 (04/13 0800) Resp:  [19-32] 20 (04/13 0800) BP: (106-136)/(64-97) 125/80 mmHg (04/13 0800) SpO2:  [93 %-99 %] 93 % (04/13 0800) Weight:  [242 lb 15.2 oz (110.2 kg)-243 lb 6 oz (110.394 kg)] 242 lb 15.2 oz (110.2 kg) (04/12 1801) Last BM Date: 01/07/16 General:  Alert, Well-developed, well-nourished, pleasant and cooperative in NAD; uncomfortable due to ascites. Head:  Normocephalic and atraumatic. Eyes:  Sclera clear, no icterus.   Conjunctiva pink.  Right eye with birthmark. Ears:  Normal auditory acuity. Mouth:  No deformity or lesions.   Lungs:  Clear  throughout to auscultation.  No wheezes, crackles, or rhonchi.  Heart:  Tachycardic.  No murmurs. Abdomen:  Massive distention/tense ascites.  BS present.  Mild diffuse TTP.  Umbilical hernia noted. Rectal:  Deferred  Msk:  Symmetrical without gross deformities. Pulses:  Normal pulses noted. Extremities:  Without clubbing or edema. Neurologic:  Alert and oriented x 4; grossly normal neurologically. Skin:  Intact without significant lesions or rashes. Psych:  Alert and cooperative. Normal mood and affect.  Intake/Output from previous day: 04/12 0701 - 04/13 0700 In: 225 [P.O.:25; IV Piggyback:200] Out: -   Lab Results:  Recent Labs  01/07/16 1625 01/08/16 0525  WBC 4.0 2.6*  HGB 9.1* 7.9*  HCT 26.8* 23.6*  PLT 187 170   BMET  Recent Labs  01/07/16 1625 01/08/16 0525  NA 129* 130*  K 4.3 4.3  CL 103 101  CO2 19* 22  GLUCOSE 135* 93  BUN 16 15  CREATININE 0.88 0.79  CALCIUM 8.5* 8.6*   LFT  Recent Labs  01/07/16 1625  PROT 7.3  ALBUMIN 2.8*  AST 60*  ALT 29  ALKPHOS 89  BILITOT 2.8*   PT/INR  Recent Labs  01/07/16 1625  LABPROT 20.5*  INR 1.77*   Studies/Results: Dg Chest Port 1 View  01/07/2016  CLINICAL DATA:  Extreme shortness of breath with wheezing and cough for 2 days. History of cirrhosis with portal hypertension. EXAM: PORTABLE CHEST 1 VIEW COMPARISON:  09/30/2015 and 01/04/2015 radiographs. Abdominal CT 01/04/2015. FINDINGS: 1654 hours. Two views obtained. Overall lower lung volumes. The left pleural effusion has enlarged with increasing left basilar pulmonary opacity. There is no significant pleural fluid on the right. The right lung is clear for the degree of inspiration. The visualized heart size and mediastinal contours are stone. Patient is status post bilateral total shoulder arthroplasty. IMPRESSION: Enlarging left pleural effusion with worsening left basilar airspace disease. Consider communication between the left pleural space and  peritoneal cavity in this patient with a history of cirrhosis and ascites. Electronically Signed   By: Carey BullocksWilliam  Veazey M.D.   On: 01/07/2016 17:12   IMPRESSION:  #1. Advanced alcoholic cirrhosis with portal hypertension. #2. Massive ascites with associated shortness of breath #3. Hyponatremia #4.  Anemia, acute on chronic:  Hgb trending down but no sign of active GI bleeding  PLAN:  #1. On for large volume therapeutic paracentesis today with albumin infusion and fluid to be sent for studies to rule out SBP. #2.  Strict 2 g sodium diet, free fluid restriction, ongoing absence from alcohol. #3.  Will need to adjust diuretics during hospital stay as well. #4.  Monitor labs. #5.  Will need eventual EGD to rule out varices/evaluated recurrent anemia. #6.  Will check AFP and abdominal ultrasound for HCC screening since last was 12/2014.   Aimie Wagman D.  01/08/2016, 10:19 AM  Pager number [phone]

## 2016-01-08 NOTE — Progress Notes (Signed)
[facility] PROGRESS NOTE  Devin Noble [id number] DOB: 18-Oct-1955 DOA: 01/07/2016 PCP: No PCP Per Patient  Assessment/Plan: 1. Alcoholic liver disease with massive ascites -[name] having a history of advanced liver disease undergoing previous ultrasound-guided paracentesis on 10/01/2015 and 12/12/2015 with removal of a 10.6 and 8 L of ascitic fluid removed respectively. -He presented with massive ascites -Interventional radiology consulted for ultrasound-guided paracentesis. 50 g of IV albumin has been ordered prior to procedure -Await on ascitic fluid analysis prior to starting antibiotics -GI consulted; will likely require upward titration of his diuretics. GI also recommending EGD to screen for varices once stable.  2.  Hyponatremia. -Labs showing sodium of 130, likely resulting from volume overload state in setting of advanced liver disease -He is on Lasix 40 mg by mouth daily and spironolactone 100 mg by mouth daily  3.  Coagulopathy. -Secondary to advanced liver disease. Lab work showing INR 1.77  4.  History of alcohol abuse. -Reporting that he quit drinking about 3 months ago  Code Status: Full code Family Communication: Family not present Disposition Plan: Plan for ultrasound-guided paracentesis today   Consultants:  GI  Procedures:  Ultrasound-guided paracentesis  HPI/Subjective: [name] is a 60 year old woman with a history of advanced alcohol and liver disease admitted to the medicine service on 01/07/2016 when he presented with complaints of severe abdominal distention associated with shortness of breath. He has a history of ascites requiring large-volume paracentesis. He reported being compliant to diuretic therapy and stated quitting drinking about 3 months ago. On physical examination findings were consistent with massive ascites. GI consulted. He was set up to undergo ultrasound-guided paracentesis with the administration of 50 g of  IV albumin prior to procedure.   Objective: Filed Vitals:   01/08/16 1318 01/08/16 1325  BP: 110/74 104/73  Pulse:    Temp:    Resp:      Intake/Output Summary (Last 24 hours) at 01/08/16 1351 Last data filed at 01/08/16 0700  Gross per 24 hour  Intake    225 ml  Output      0 ml  Net    225 ml   Filed Weights   01/07/16 1801  Weight: 110.2 kg (242 lb 15.2 oz)    Exam:   General:  Chronically ill-appearing, no acute distress awake and alert  Cardiovascular: Regular rate and rhythm normal S1-S2  Respiratory: Diminished breath sounds bilaterally, few bibasilar crackles  Abdomen: Massive distention of abdomen, did not have severe pain with palpation  Musculoskeletal: 2-3 bilateral extremity pitting edema  Data Reviewed: Basic Metabolic Panel:  Recent Labs Lab 01/07/16 1625 01/08/16 0525  NA 129* 130*  K 4.3 4.3  CL 103 101  CO2 19* 22  GLUCOSE 135* 93  BUN 16 15  CREATININE 0.88 0.79  CALCIUM 8.5* 8.6*   Liver Function Tests:  Recent Labs Lab 01/07/16 1625  AST 60*  ALT 29  ALKPHOS 89  BILITOT 2.8*  PROT 7.3  ALBUMIN 2.8*    Recent Labs Lab 01/07/16 1625  LIPASE 29   No results for input(s): AMMONIA in the last 168 hours. CBC:  Recent Labs Lab 01/07/16 1625 01/08/16 0525  WBC 4.0 2.6*  NEUTROABS 2.0  --   HGB 9.1* 7.9*  HCT 26.8* 23.6*  MCV 77.7* 78.7  PLT 187 170   Cardiac Enzymes:  Recent Labs Lab 01/07/16 1625  TROPONINI <0.03   BNP (last 3 results) No results for input(s): BNP in the last 8760 hours.  ProBNP (last 3 results) No results for input(s): PROBNP in the last 8760 hours.  CBG: No results for input(s): GLUCAP in the last 168 hours.  No results found for this or any previous visit (from the past 240 hour(s)).   Studies: Dg Chest Port 1 View  01/07/2016  CLINICAL DATA:  Extreme shortness of breath with wheezing and cough for 2 days. History of cirrhosis with portal hypertension. EXAM: PORTABLE CHEST 1 VIEW  COMPARISON:  09/30/2015 and 01/04/2015 radiographs. Abdominal CT 01/04/2015. FINDINGS: 1654 hours. Two views obtained. Overall lower lung volumes. The left pleural effusion has enlarged with increasing left basilar pulmonary opacity. There is no significant pleural fluid on the right. The right lung is clear for the degree of inspiration. The visualized heart size and mediastinal contours are stone. Patient is status post bilateral total shoulder arthroplasty. IMPRESSION: Enlarging left pleural effusion with worsening left basilar airspace disease. Consider communication between the left pleural space and peritoneal cavity in this patient with a history of cirrhosis and ascites. Electronically Signed   By: Carey Bullocks M.D.   On: 01/07/2016 17:12    Scheduled Meds: . furosemide  40 mg Oral Daily  . pantoprazole  40 mg Oral Daily  . sodium chloride flush  3 mL Intravenous Q12H  . spironolactone  100 mg Oral Daily   Continuous Infusions:   Principal Problem:   Alcoholic liver failure (HCC) Active Problems:   Cirrhosis of liver with ascites (HCC)   Ascites   Anemia, iron deficiency   Alcohol abuse    Time spent: 35 min    Devin Noble  [facility] Pager [phone]. If 7PM-7AM, please contact night-coverage at [email or web address], password [facility] 01/08/2016, 1:51 PM  LOS: 1 day

## 2016-01-09 DIAGNOSIS — R188 Other ascites: Secondary | ICD-10-CM

## 2016-01-09 DIAGNOSIS — E871 Hypo-osmolality and hyponatremia: Secondary | ICD-10-CM

## 2016-01-09 DIAGNOSIS — K766 Portal hypertension: Secondary | ICD-10-CM

## 2016-01-09 LAB — BASIC METABOLIC PANEL
ANION GAP: 7 (ref 5–15)
BUN: 14 mg/dL (ref 6–20)
CALCIUM: 8.2 mg/dL — AB (ref 8.9–10.3)
CO2: 22 mmol/L (ref 22–32)
Chloride: 101 mmol/L (ref 101–111)
Creatinine, Ser: 0.78 mg/dL (ref 0.61–1.24)
Glucose, Bld: 85 mg/dL (ref 65–99)
Potassium: 3.9 mmol/L (ref 3.5–5.1)
SODIUM: 130 mmol/L — AB (ref 135–145)

## 2016-01-09 LAB — AFP TUMOR MARKER: AFP TUMOR MARKER: 3.1 ng/mL (ref 0.0–8.3)

## 2016-01-09 LAB — PATHOLOGIST SMEAR REVIEW

## 2016-01-09 MED ORDER — SPIRONOLACTONE 100 MG PO TABS: 200.0000 mg | ORAL_TABLET | Freq: Every day | ORAL | Status: DC

## 2016-01-09 MED ORDER — SPIRONOLACTONE 100 MG PO TABS
200.0000 mg | ORAL_TABLET | Freq: Every day | ORAL | Status: DC
  Administered 2016-01-09: 200 mg via ORAL
  Filled 2016-01-09: qty 2

## 2016-01-09 MED ORDER — FUROSEMIDE 80 MG PO TABS
80.0000 mg | ORAL_TABLET | Freq: Every day | ORAL | Status: DC
Start: 2016-01-09 — End: 2016-01-24

## 2016-01-09 MED ORDER — FUROSEMIDE 80 MG PO TABS
80.0000 mg | ORAL_TABLET | Freq: Every day | ORAL | Status: DC
  Administered 2016-01-09: 80 mg via ORAL
  Filled 2016-01-09: qty 1

## 2016-01-09 NOTE — Discharge Summary (Signed)
Physician Discharge Summary  Devin Noble L Mcneal [id number] DOB: Jul 13, 1956 DOA: 01/07/2016  PCP: No PCP Per Patient  Admit date: 01/07/2016 Discharge date: 01/09/2016  Time spent: 35 minutes  Recommendations for Outpatient Follow-up:  1. Please follow up on BMP on hospital follow-up visit, his diuretic therapy was increased on discharge. On 01/09/2016 he had a creatinine 0.78 with BUN of 14. 2. During this hospitalization he underwent large volume paracentesis with removal of 10 L of ascitic fluid. Suspect he will require future paracentesis, was given short-term follow-up with GI   Discharge Diagnoses:  Principal Problem:   Alcoholic liver failure (HCC) Active Problems:   Cirrhosis of liver with ascites (HCC)   Ascites   Anemia, iron deficiency   Alcohol abuse   Discharge Condition: Stable  Diet recommendation: Low sodium fluid restriction diet  Filed Weights   01/07/16 1801  Weight: 110.2 kg (242 lb 15.2 oz)    History of present illness:  Devin Noble L Devin Noble is a 60 y.o. male of a past medical history of advanced alcoholic liver disease, portal hypertension, having history of ascites requiring large-volume paracentesis. On 10/01/2015 he underwent ultrasound-guided paracentesis removing 10.6 L of ascitic fluid. Ultrasound-guided paracentesis on 12/12/2015 yielded 8 L of ascitic fluid. He presented to [facility] this morning complaining of worsening abdominal distention leading to the development of significant shortness of breath. He states he was supposed to have a paracentesis done several weeks ago. He denies productive cough, fevers, chills, nausea, vomiting. Reporting quit drinking approximately 3 months ago. He was seen and evaluated by Dr. Marina GoodellPerry of GI who recommended hospitalization for large-volume therapeutic paracentesis. Mr Devin Noble appears dyspneic and uncomfortable in the emergency department.  Hospital Course:  [name] is a 60 year old woman with a history  of advanced alcohol and liver disease admitted to the medicine service on 01/07/2016 when he presented with complaints of severe abdominal distention associated with shortness of breath. He has a history of ascites requiring large-volume paracentesis. He reported being compliant to diuretic therapy and stated quitting drinking about 3 months ago. On physical examination findings were consistent with massive ascites. GI consulted. He was set up to undergo ultrasound-guided paracentesis with the administration of 50 g of IV albumin prior to procedure.  On 01/08/2016 interventional radiology removed at 10 L of ascitic fluid, he tolerated procedure well there are no immediate complications. Ascitic fluid analysis revealed 58 white blood cells, making SBP unlikely. During this hospitalization had abdominal ultrasound which did not reveal findings concerning for hepatocellular carcinoma. AFP within normal limits. GI recommended increasing Lasix to 80 mg and spironolactone to 200 mg. Please follow up on BMP on hospital follow-up visit.  Procedures:  Ultrasound-guided paracentesis performed on 01/08/2016 with removal of 10 L of ascitic fluid  Consultations: GI  Discharge Exam: Filed Vitals:   01/09/16 0530 01/09/16 1006  BP:  104/68  Pulse: 103 108  Temp: 98.5 F (36.9 C) 97.9 F (36.6 C)  Resp: 20 18     General: Chronically ill-appearing, no acute distress awake and alert  Cardiovascular: Regular rate and rhythm normal S1-S2  Respiratory: Diminished breath sounds bilaterally, few bibasilar crackles  Abdomen: Having improvement to massive distention of abdomen. His abdomen was soft nontender to palpation having positive bowel sounds. This was after large volume paracentesis  Musculoskeletal: 2-3 bilateral extremity pitting edema  Discharge Instructions   Discharge Instructions    Call MD for:  difficulty breathing, headache or visual disturbances    Complete by:  As directed  Call MD for:  extreme fatigue    Complete by:  As directed      Call MD for:  hives    Complete by:  As directed      Call MD for:  persistant dizziness or light-headedness    Complete by:  As directed      Call MD for:  persistant nausea and vomiting    Complete by:  As directed      Call MD for:  redness, tenderness, or signs of infection (pain, swelling, redness, odor or green/yellow discharge around incision site)    Complete by:  As directed      Call MD for:  severe uncontrolled pain    Complete by:  As directed      Call MD for:  temperature >100.4    Complete by:  As directed      Call MD for:    Complete by:  As directed      Diet - low sodium heart healthy    Complete by:  As directed      Increase activity slowly    Complete by:  As directed           Current Discharge Medication List    CONTINUE these medications which have CHANGED   Details  furosemide (LASIX) 80 MG tablet Take 1 tablet (80 mg total) by mouth daily. Qty: 30 tablet, Refills: 3    spironolactone (ALDACTONE) 100 MG tablet Take 2 tablets (200 mg total) by mouth daily. Qty: 30 tablet, Refills: 3      CONTINUE these medications which have NOT CHANGED   Details  [employer] B-1 100 MG tablet Take 1 tablet by mouth daily. Refills: 2    folic acid (FOLVITE) 1 MG tablet Take 1 tablet (1 mg total) by mouth daily. Qty: 30 tablet, Refills: 11    pantoprazole (PROTONIX) 40 MG tablet Take 1 tablet (40 mg total) by mouth daily. Qty: 30 tablet, Refills: 3      STOP taking these medications     feeding supplement, ENSURE ENLIVE, (ENSURE ENLIVE) LIQD        Allergies  Allergen Reactions  . Tramadol Hives and Itching   Follow-up Information    Follow up with [name], [name]., PA-C In 1 week.   Specialty:  Gastroenterology   Contact information:   [address] [place] [address] [phone]        The results of significant diagnostics from this hospitalization (including imaging, microbiology,  ancillary and laboratory) are listed below for reference.    Significant Diagnostic Studies: US Abdomen Complete  01/08/2016  ADDENDUM REPORT: 01/08/2016 18:54 ADDENDUM: Disregard the original report. Paracentesis report was inadvertently put on this report. See below for the abdominal ultrasound report. INDICATION: Cirrhosis. COMPARISON:  01/04/2015 FINDINGS: Gallbladder: Gallbladder is surrounded by ascites. Gallbladder wall is thickened measuring up to 0.7 cm. No evidence for gallstones. CBD:  Measures 0.2 cm. Liver: The liver is diffusely nodular and heterogeneous. Findings are suggestive for cirrhosis. Main portal vein is patent. A large amount of ascites around the liver. IVC:  Grossly normal. Pancreas: Not visualized due to bowel gas. Spleen: Spleen is enlarged measuring 15.5 x 16.4 x 6.7 cm. Calculated volume is 910 mL. Spleen echogenicity is within normal limits without a focal abnormality. Right kidney: Measures 12.9 cm. Echogenicity is within normal limits. No hydronephrosis. No suspicious lesion. Left kidney: Measures 12.1 cm. Normal echogenicity. No hydronephrosis. No suspicious lesions. Aorta: No aneurysm. Limited evaluation  of the distal abdominal aorta. Other: Large amount of ascites throughout the abdomen. IMPRESSION: Large amount of ascites. Cirrhotic liver. Gallbladder wall is thickened and this may be secondary to the ascites. No evidence for gallstones. No biliary dilatation. Splenomegaly compatible with portal hypertension. Electronically Signed   By: Richarda Overlie M.D.   On: 01/08/2016 18:54  01/08/2016  INDICATION: This is a patient with advanced alcoholic cirrhosis who presents with recurrent ascites. Request has been made for diagnostic and therapeutic paracentesis. EXAM: ULTRASOUND-GUIDED DIAGNOSTIC AND THERAPEUTIC PARACENTESIS MEDICATIONS: 1% lidocaine COMPLICATIONS: None immediate. TECHNIQUE: Informed written consent was obtained from the patient after a discussion of the risks,  benefits and alternatives to treatment. A timeout was performed prior to the initiation of the procedure. Initial ultrasound scanning demonstrates a large amount of ascites within the right upper abdominal quadrant. The right upper abdomen was prepped and draped in the usual sterile fashion. 1% lidocaine was used for local anesthesia. A, 7-cm, Yueh catheter was introduced. The paracentesis was performed. The catheter was removed and a dressing was applied. The patient tolerated the procedure well without immediate post procedural complication. FINDINGS: A total of approximately 10 liters of cloudy yellow fluid was removed. Samples were sent to the laboratory as requested by the clinical team. IMPRESSION: Successful ultrasound-guided paracentesis yielding 10 liters of peritoneal fluid. Read by: Barnetta Chapel, PA-C Electronically Signed: By: Richarda Overlie M.D. On: 01/08/2016 14:20   US Paracentesis  01/08/2016  INDICATION: Patient with advanced alcoholic cirrhosis who presents with recurrent ascites. Request is made for diagnostic and therapeutic paracentesis. EXAM: ULTRASOUND GUIDED PARACENTESIS MEDICATIONS: None. COMPLICATIONS: None immediate. PROCEDURE: Informed written consent was obtained from the patient after a discussion of the risks, benefits and alternatives to treatment. A timeout was performed prior to the initiation of the procedure. Initial ultrasound scanning demonstrates a large amount of ascites within the right right upper abdominal quadrant. The right lower abdomen was prepped and draped in the usual sterile fashion. 1% lidocaine with epinephrine was used for local anesthesia. Following this, a 7 cm Yueh catheter was introduced. An ultrasound image was saved for documentation purposes. The paracentesis was performed. The catheter was removed and a dressing was applied. The patient tolerated the procedure well without immediate post procedural complication. FINDINGS: A total of approximately 10 L of  cloudy yellow fluid was removed. Samples were sent to the laboratory as requested by the clinical team. IMPRESSION: Successful ultrasound-guided paracentesis yielding 10 L liters of peritoneal fluid. Procedure performed by Barnetta Chapel, PA-C Electronically Signed   By: Richarda Overlie M.D.   On: 01/08/2016 16:45   US Paracentesis  12/12/2015  Brayton El, PA-C     12/12/2015 10:18 AM Successful US guided paracentesis from RLQ. Yielded 8L of cloudy yellow fluid. No immediate complications. Pt tolerated well. Specimen was sent for labs. Brayton El PA-C 12/12/2015 10:17 AM   Dg Chest Port 1 View  01/07/2016  CLINICAL DATA:  Extreme shortness of breath with wheezing and cough for 2 days. History of cirrhosis with portal hypertension. EXAM: PORTABLE CHEST 1 VIEW COMPARISON:  09/30/2015 and 01/04/2015 radiographs. Abdominal CT 01/04/2015. FINDINGS: 1654 hours. Two views obtained. Overall lower lung volumes. The left pleural effusion has enlarged with increasing left basilar pulmonary opacity. There is no significant pleural fluid on the right. The right lung is clear for the degree of inspiration. The visualized heart size and mediastinal contours are stone. Patient is status post bilateral total shoulder arthroplasty. IMPRESSION: Enlarging left pleural effusion with worsening  left basilar airspace disease. Consider communication between the left pleural space and peritoneal cavity in this patient with a history of cirrhosis and ascites. Electronically Signed   By: Carey Bullocks M.D.   On: 01/07/2016 17:12    Microbiology: Recent Results (from the past 240 hour(s))  Culture, blood (routine x 2)     Status: None (Preliminary result)   Collection Time: 01/07/16  4:25 PM  Result Value Ref Range Status   Specimen Description BLOOD LEFT ARM  Final   Special Requests BOTTLES DRAWN AEROBIC AND ANAEROBIC 5 CC  Final   Culture   Final    NO GROWTH < 24 HOURS Performed at [facility]    Report Status  PENDING  Incomplete  Culture, blood (routine x 2)     Status: None (Preliminary result)   Collection Time: 01/07/16  4:35 PM  Result Value Ref Range Status   Specimen Description BLOOD LEFT HAND  Final   Special Requests BOTTLES DRAWN AEROBIC AND ANAEROBIC 3 CC  Final   Culture   Final    NO GROWTH < 24 HOURS Performed at [facility]    Report Status PENDING  Incomplete  Gram stain     Status: None   Collection Time: 01/08/16  1:12 PM  Result Value Ref Range Status   Specimen Description FLUID PERITONEAL  Final   Special Requests NONE  Final   Gram Stain   Final    RARE WBC PRESENT, PREDOMINANTLY MONONUCLEAR NO ORGANISMS SEEN Performed at [facility]    Report Status 01/08/2016 FINAL  Final     Labs: Basic Metabolic Panel:  Recent Labs Lab 01/07/16 1625 01/08/16 0525 01/09/16 0519  NA 129* 130* 130*  K 4.3 4.3 3.9  CL 103 101 101  CO2 19* 22 22  GLUCOSE 135* 93 85  BUN 16 15 14   CREATININE 0.88 0.79 0.78  CALCIUM 8.5* 8.6* 8.2*   Liver Function Tests:  Recent Labs Lab 01/07/16 1625  AST 60*  ALT 29  ALKPHOS 89  BILITOT 2.8*  PROT 7.3  ALBUMIN 2.8*    Recent Labs Lab 01/07/16 1625  LIPASE 29   No results for input(s): AMMONIA in the last 168 hours. CBC:  Recent Labs Lab 01/07/16 1625 01/08/16 0525  WBC 4.0 2.6*  NEUTROABS 2.0  --   HGB 9.1* 7.9*  HCT 26.8* 23.6*  MCV 77.7* 78.7  PLT 187 170   Cardiac Enzymes:  Recent Labs Lab 01/07/16 1625  TROPONINI <0.03   BNP: BNP (last 3 results) No results for input(s): BNP in the last 8760 hours.  ProBNP (last 3 results) No results for input(s): PROBNP in the last 8760 hours.  CBG: No results for input(s): GLUCAP in the last 168 hours.     Signed:  Jeralyn Bennett MD.  [facility] 01/09/2016, 2:10 PM

## 2016-01-09 NOTE — Progress Notes (Signed)
Progress Note   Subjective  Status post 10 L paracentesis with IV albumin Feels much improved from an abdominal discomfort standpoint and shortness of breath Tolerating low-sodium diet Asking about going home   Objective   Vital signs in last 24 hours: Temp:  [97.9 F (36.6 C)-98.5 F (36.9 C)] 97.9 F (36.6 C) (04/14 1006) Pulse Rate:  [100-108] 108 (04/14 1006) Resp:  [16-20] 18 (04/14 1006) BP: (104-130)/(34-79) 104/68 mmHg (04/14 1006) SpO2:  [94 %-99 %] 98 % (04/14 1006) Last BM Date: 01/07/16 Gen: awake, alert, NAD, appearing older than stated age HEENT: mildly icteric, op clear and dry CV: tachycardic and regular Pulm: clear decreased bilateral bases Abd: soft, NT, still moderately distended with fluid but not tense, +BS throughout Ext: no c/c, 1+ edema Neuro: nonfocal, no asterixis  Lab Results:  Recent Labs  01/07/16 1625 01/08/16 0525  WBC 4.0 2.6*  HGB 9.1* 7.9*  HCT 26.8* 23.6*  PLT 187 170   BMET  Recent Labs  01/07/16 1625 01/08/16 0525 01/09/16 0519  NA 129* 130* 130*  K 4.3 4.3 3.9  CL 103 101 101  CO2 19* 22 22  GLUCOSE 135* 93 85  BUN 16 15 14   CREATININE 0.88 0.79 0.78  CALCIUM 8.5* 8.6* 8.2*   LFT  Recent Labs  01/07/16 1625  PROT 7.3  ALBUMIN 2.8*  AST 60*  ALT 29  ALKPHOS 89  BILITOT 2.8*   PT/INR  Recent Labs  01/07/16 1625  LABPROT 20.5*  INR 1.77*   Ascitic fluid -- WBC 58  Studies/Results: [place] Abdomen Complete  01/08/2016  ADDENDUM REPORT: 01/08/2016 18:54 ADDENDUM: Disregard the original report. Paracentesis report was inadvertently put on this report. See below for the abdominal ultrasound report. INDICATION: Cirrhosis. COMPARISON:  01/04/2015 FINDINGS: Gallbladder: Gallbladder is surrounded by ascites. Gallbladder wall is thickened measuring up to 0.7 cm. No evidence for gallstones. CBD:  Measures 0.2 cm. Liver: The liver is diffusely nodular and heterogeneous. Findings are suggestive for cirrhosis. Main  portal vein is patent. A large amount of ascites around the liver. IVC:  Grossly normal. Pancreas: Not visualized due to bowel gas. Spleen: Spleen is enlarged measuring 15.5 x 16.4 x 6.7 cm. Calculated volume is 910 mL. Spleen echogenicity is within normal limits without a focal abnormality. Right kidney: Measures 12.9 cm. Echogenicity is within normal limits. No hydronephrosis. No suspicious lesion. Left kidney: Measures 12.1 cm. Normal echogenicity. No hydronephrosis. No suspicious lesions. Aorta: No aneurysm. Limited evaluation of the distal abdominal aorta. Other: Large amount of ascites throughout the abdomen. IMPRESSION: Large amount of ascites. Cirrhotic liver. Gallbladder wall is thickened and this may be secondary to the ascites. No evidence for gallstones. No biliary dilatation. Splenomegaly compatible with portal hypertension. Electronically Signed   By: Richarda OverlieAdam  Henn M.D.   On: 01/08/2016 18:54  01/08/2016  INDICATION: This is a patient with advanced alcoholic cirrhosis who presents with recurrent ascites. Request has been made for diagnostic and therapeutic paracentesis. EXAM: ULTRASOUND-GUIDED DIAGNOSTIC AND THERAPEUTIC PARACENTESIS MEDICATIONS: 1% lidocaine COMPLICATIONS: None immediate. TECHNIQUE: Informed written consent was obtained from the patient after a discussion of the risks, benefits and alternatives to treatment. A timeout was performed prior to the initiation of the procedure. Initial ultrasound scanning demonstrates a large amount of ascites within the right upper abdominal quadrant. The right upper abdomen was prepped and draped in the usual sterile fashion. 1% lidocaine was used for local anesthesia. A, 7-cm, Yueh catheter was introduced. The paracentesis was performed. The  catheter was removed and a dressing was applied. The patient tolerated the procedure well without immediate post procedural complication. FINDINGS: A total of approximately 10 liters of cloudy yellow fluid was removed.  Samples were sent to the laboratory as requested by the clinical team. IMPRESSION: Successful ultrasound-guided paracentesis yielding 10 liters of peritoneal fluid. Read by: Barnetta Chapel, PA-C Electronically Signed: By: Richarda Overlie M.D. On: 01/08/2016 14:20   US Paracentesis  01/08/2016  INDICATION: Patient with advanced alcoholic cirrhosis who presents with recurrent ascites. Request is made for diagnostic and therapeutic paracentesis. EXAM: ULTRASOUND GUIDED PARACENTESIS MEDICATIONS: None. COMPLICATIONS: None immediate. PROCEDURE: Informed written consent was obtained from the patient after a discussion of the risks, benefits and alternatives to treatment. A timeout was performed prior to the initiation of the procedure. Initial ultrasound scanning demonstrates a large amount of ascites within the right right upper abdominal quadrant. The right lower abdomen was prepped and draped in the usual sterile fashion. 1% lidocaine with epinephrine was used for local anesthesia. Following this, a 7 cm Yueh catheter was introduced. An ultrasound image was saved for documentation purposes. The paracentesis was performed. The catheter was removed and a dressing was applied. The patient tolerated the procedure well without immediate post procedural complication. FINDINGS: A total of approximately 10 L of cloudy yellow fluid was removed. Samples were sent to the laboratory as requested by the clinical team. IMPRESSION: Successful ultrasound-guided paracentesis yielding 10 L liters of peritoneal fluid. Procedure performed by Barnetta Chapel, PA-C Electronically Signed   By: Richarda Overlie M.D.   On: 01/08/2016 16:45   Dg Chest Port 1 View  01/07/2016  CLINICAL DATA:  Extreme shortness of breath with wheezing and cough for 2 days. History of cirrhosis with portal hypertension. EXAM: PORTABLE CHEST 1 VIEW COMPARISON:  09/30/2015 and 01/04/2015 radiographs. Abdominal CT 01/04/2015. FINDINGS: 1654 hours. Two views obtained. Overall  lower lung volumes. The left pleural effusion has enlarged with increasing left basilar pulmonary opacity. There is no significant pleural fluid on the right. The right lung is clear for the degree of inspiration. The visualized heart size and mediastinal contours are stone. Patient is status post bilateral total shoulder arthroplasty. IMPRESSION: Enlarging left pleural effusion with worsening left basilar airspace disease. Consider communication between the left pleural space and peritoneal cavity in this patient with a history of cirrhosis and ascites. Electronically Signed   By: Carey Bullocks M.D.   On: 01/07/2016 17:12     Assessment / Plan:   60 year old male with decompensated alcoholic cirrhosis  1. Decompensated cirrhosis with massive ascites --Improved after large volume paracentesis. Renal function stable. No SBP --I have titrated furosemide and spironolactone to 80 mg and 200 mg respectively. We'll need to pay close attention to renal function as well as serum sodium in the setting of titration of diuretics.I have explained to the patient that in some patients serum sodium prevents further titration of diuretics. --Low sodium diet --Will need large volume paracentesis likely on schedule as an outpatient while diuretics are titrated --CHL referral after discharge --No ETOH, I have explained to him the importance of complete alcohol cessation. He has been alcohol free 2-3 months to this point --Outpatient EGD for variceal screening --US reviewed and neg for HCC, AFP normal.  Needs HCC screening q 6 months   Principal Problem:   Alcoholic liver failure (HCC) Active Problems:   Cirrhosis of liver with ascites (HCC)   Ascites   Anemia, iron deficiency   Alcohol abuse  LOS: 2 days   Roby Spalla M  01/09/2016, 12:28 PM

## 2016-01-12 ENCOUNTER — Other Ambulatory Visit: Payer: Self-pay

## 2016-01-12 ENCOUNTER — Other Ambulatory Visit (INDEPENDENT_AMBULATORY_CARE_PROVIDER_SITE_OTHER): Payer: Medicare Other

## 2016-01-12 ENCOUNTER — Telehealth: Payer: Self-pay

## 2016-01-12 DIAGNOSIS — K7031 Alcoholic cirrhosis of liver with ascites: Secondary | ICD-10-CM | POA: Diagnosis not present

## 2016-01-12 LAB — COMPREHENSIVE METABOLIC PANEL
ALT: 20 U/L (ref 0–53)
AST: 37 U/L (ref 0–37)
Albumin: 2.8 g/dL — ABNORMAL LOW (ref 3.5–5.2)
Alkaline Phosphatase: 95 U/L (ref 39–117)
BUN: 9 mg/dL (ref 6–23)
CHLORIDE: 98 meq/L (ref 96–112)
CO2: 22 mEq/L (ref 19–32)
Calcium: 8.9 mg/dL (ref 8.4–10.5)
Creatinine, Ser: 0.85 mg/dL (ref 0.40–1.50)
GFR: 98.04 mL/min (ref >60.00)
GLUCOSE: 89 mg/dL (ref 70–99)
POTASSIUM: 4.1 meq/L (ref 3.5–5.1)
SODIUM: 129 meq/L — AB (ref 135–145)
Total Bilirubin: 3.7 mg/dL — ABNORMAL HIGH (ref 0.2–1.2)
Total Protein: 7.2 g/dL (ref 6.0–8.3)

## 2016-01-12 LAB — CULTURE, BLOOD (ROUTINE X 2)
CULTURE: NO GROWTH
Culture: NO GROWTH

## 2016-01-12 NOTE — Telephone Encounter (Signed)
Spoke with pt and he knows to come for labs today or tomorrow. [facility]16@10am , pt to arrive there at 9:45am. Max of 8liters to be removed. Pt to receive Albumin 8gm IV for each liter of fluid removed. Pt aware.

## 2016-01-12 NOTE — Telephone Encounter (Signed)
-----   Message from Beverley FiedlerJay M Pyrtle, MD sent at 01/09/2016  3:03 PM EDT ----- Regarding: LVP Devin Noble patient Needs CMP Monday or Tuesday Will need serial LVPs arranged while ascites remains a problem with IV albumin D/C home was Friday

## 2016-01-13 ENCOUNTER — Other Ambulatory Visit: Payer: Self-pay

## 2016-01-13 DIAGNOSIS — K7031 Alcoholic cirrhosis of liver with ascites: Secondary | ICD-10-CM

## 2016-01-13 LAB — CULTURE, BODY FLUID W GRAM STAIN -BOTTLE

## 2016-01-13 LAB — CULTURE, BODY FLUID-BOTTLE: CULTURE: NO GROWTH

## 2016-01-14 ENCOUNTER — Ambulatory Visit (HOSPITAL_COMMUNITY)
Admission: RE | Admit: 2016-01-14 | Discharge: 2016-01-14 | Disposition: A | Discharge status: Home / Self Care | Payer: Medicare Other | Source: Ambulatory Visit | Attending: Internal Medicine | Admitting: Internal Medicine

## 2016-01-14 DIAGNOSIS — R188 Other ascites: Secondary | ICD-10-CM | POA: Insufficient documentation

## 2016-01-14 DIAGNOSIS — K7031 Alcoholic cirrhosis of liver with ascites: Secondary | ICD-10-CM

## 2016-01-14 LAB — BODY FLUID CELL COUNT WITH DIFFERENTIAL
Eos, Fluid: 0 %
LYMPHS FL: 72 %
Monocyte-Macrophage-Serous Fluid: 28 % — ABNORMAL LOW (ref 50–90)
NEUTROPHIL FLUID: 0 % (ref 0–25)
Total Nucleated Cell Count, Fluid: 49 cu mm (ref 0–1000)

## 2016-01-14 MED ORDER — LIDOCAINE HCL (PF) 1 % IJ SOLN
INTRAMUSCULAR | Status: AC
  Filled 2016-01-14: qty 10

## 2016-01-14 MED ORDER — ALBUMIN HUMAN 25 % IV SOLN
62.5000 g | Freq: Once | INTRAVENOUS | Status: AC
  Administered 2016-01-14: 62.5 g via INTRAVENOUS
  Filled 2016-01-14: qty 300

## 2016-01-14 NOTE — Procedures (Signed)
   LLQ US guided paracentesis 8 liter milky yellow fluid  8 liter maximum per MD  8 gr/L IV albumin per MD 62.5 gr IV Albumin ordered  Tolerated well

## 2016-01-15 LAB — PATHOLOGIST SMEAR REVIEW

## 2016-01-16 ENCOUNTER — Telehealth: Payer: Self-pay | Admitting: Internal Medicine

## 2016-01-16 NOTE — Telephone Encounter (Signed)
Pt scheduled to see Doug SouJessica [name] PA 01/21/16@1 :30pm. Pt aware of appt.

## 2016-01-21 ENCOUNTER — Encounter (HOSPITAL_COMMUNITY): Payer: Self-pay | Admitting: Emergency Medicine

## 2016-01-21 ENCOUNTER — Ambulatory Visit (INDEPENDENT_AMBULATORY_CARE_PROVIDER_SITE_OTHER): Payer: Medicare Other | Admitting: Gastroenterology

## 2016-01-21 ENCOUNTER — Other Ambulatory Visit (INDEPENDENT_AMBULATORY_CARE_PROVIDER_SITE_OTHER): Payer: Medicare Other

## 2016-01-21 ENCOUNTER — Encounter: Payer: Self-pay | Admitting: Gastroenterology

## 2016-01-21 ENCOUNTER — Inpatient Hospital Stay (HOSPITAL_COMMUNITY)
Admission: EM | Admit: 2016-01-21 | Discharge: 2016-01-24 | DRG: 312 | Disposition: A | Discharge status: Home / Self Care | Payer: Medicare Other | Attending: Internal Medicine | Admitting: Internal Medicine

## 2016-01-21 VITALS — BP 80/50 | HR 104 | Ht 71.0 in | Wt 185.1 lb

## 2016-01-21 DIAGNOSIS — K7031 Alcoholic cirrhosis of liver with ascites: Secondary | ICD-10-CM | POA: Diagnosis not present

## 2016-01-21 DIAGNOSIS — I10 Essential (primary) hypertension: Secondary | ICD-10-CM | POA: Diagnosis present

## 2016-01-21 DIAGNOSIS — W19XXXA Unspecified fall, initial encounter: Secondary | ICD-10-CM | POA: Diagnosis not present

## 2016-01-21 DIAGNOSIS — I951 Orthostatic hypotension: Secondary | ICD-10-CM

## 2016-01-21 DIAGNOSIS — R42 Dizziness and giddiness: Secondary | ICD-10-CM | POA: Diagnosis not present

## 2016-01-21 DIAGNOSIS — K766 Portal hypertension: Secondary | ICD-10-CM | POA: Diagnosis present

## 2016-01-21 DIAGNOSIS — D689 Coagulation defect, unspecified: Secondary | ICD-10-CM | POA: Diagnosis present

## 2016-01-21 DIAGNOSIS — Z96612 Presence of left artificial shoulder joint: Secondary | ICD-10-CM | POA: Diagnosis present

## 2016-01-21 DIAGNOSIS — E861 Hypovolemia: Secondary | ICD-10-CM | POA: Diagnosis not present

## 2016-01-21 DIAGNOSIS — Z79899 Other long term (current) drug therapy: Secondary | ICD-10-CM

## 2016-01-21 DIAGNOSIS — E871 Hypo-osmolality and hyponatremia: Secondary | ICD-10-CM | POA: Diagnosis present

## 2016-01-21 DIAGNOSIS — E869 Volume depletion, unspecified: Secondary | ICD-10-CM | POA: Diagnosis present

## 2016-01-21 DIAGNOSIS — S51012A Laceration without foreign body of left elbow, initial encounter: Secondary | ICD-10-CM | POA: Diagnosis present

## 2016-01-21 DIAGNOSIS — F1011 Alcohol abuse, in remission: Secondary | ICD-10-CM | POA: Diagnosis present

## 2016-01-21 DIAGNOSIS — K219 Gastro-esophageal reflux disease without esophagitis: Secondary | ICD-10-CM | POA: Diagnosis present

## 2016-01-21 DIAGNOSIS — Z885 Allergy status to narcotic agent status: Secondary | ICD-10-CM

## 2016-01-21 DIAGNOSIS — R339 Retention of urine, unspecified: Secondary | ICD-10-CM | POA: Diagnosis present

## 2016-01-21 DIAGNOSIS — W1839XA Other fall on same level, initial encounter: Secondary | ICD-10-CM | POA: Diagnosis present

## 2016-01-21 DIAGNOSIS — D649 Anemia, unspecified: Secondary | ICD-10-CM | POA: Diagnosis present

## 2016-01-21 DIAGNOSIS — F1010 Alcohol abuse, uncomplicated: Secondary | ICD-10-CM

## 2016-01-21 LAB — CBC
HEMATOCRIT: 27.8 % — AB (ref 39.0–52.0)
Hemoglobin: 9.9 g/dL — ABNORMAL LOW (ref 13.0–17.0)
MCH: 27.3 pg (ref 26.0–34.0)
MCHC: 35.6 g/dL (ref 30.0–36.0)
MCV: 76.8 fL — AB (ref 78.0–100.0)
Platelets: 521 10*3/uL — ABNORMAL HIGH (ref 150–400)
RBC: 3.62 MIL/uL — ABNORMAL LOW (ref 4.22–5.81)
RDW: 18.2 % — AB (ref 11.5–15.5)
WBC: 6.3 10*3/uL (ref 4.0–10.5)

## 2016-01-21 LAB — BASIC METABOLIC PANEL
ANION GAP: 11 (ref 5–15)
BUN: 19 mg/dL (ref 6–20)
CALCIUM: 8.9 mg/dL (ref 8.9–10.3)
CO2: 19 mmol/L — AB (ref 22–32)
Chloride: 95 mmol/L — ABNORMAL LOW (ref 101–111)
Creatinine, Ser: 1.22 mg/dL (ref 0.61–1.24)
GFR calc Af Amer: >60 mL/min (ref >60)
GFR calc non Af Amer: >60 mL/min (ref >60)
GLUCOSE: 112 mg/dL — AB (ref 65–99)
Potassium: 4.3 mmol/L (ref 3.5–5.1)
Sodium: 125 mmol/L — ABNORMAL LOW (ref 135–145)

## 2016-01-21 LAB — CBC WITH DIFFERENTIAL/PLATELET
BASOS PCT: 0.6 % (ref 0.0–3.0)
Basophils Absolute: 0 10*3/uL (ref 0.0–0.1)
EOS PCT: 0.4 % (ref 0.0–5.0)
Eosinophils Absolute: 0 10*3/uL (ref 0.0–0.7)
HCT: 30.2 % — ABNORMAL LOW (ref 39.0–52.0)
HEMOGLOBIN: 10.1 g/dL — AB (ref 13.0–17.0)
LYMPHS ABS: 0.6 10*3/uL — AB (ref 0.7–4.0)
Lymphocytes Relative: 9 % — ABNORMAL LOW (ref 12.0–46.0)
MCHC: 33.3 g/dL (ref 30.0–36.0)
MCV: 79.5 fl (ref 78.0–100.0)
MONO ABS: 1.1 10*3/uL — AB (ref 0.1–1.0)
Monocytes Relative: 16.9 % — ABNORMAL HIGH (ref 3.0–12.0)
NEUTROS ABS: 4.6 10*3/uL (ref 1.4–7.7)
NEUTROS PCT: 73.1 % (ref 43.0–77.0)
PLATELETS: 269 10*3/uL (ref 150.0–400.0)
RBC: 3.8 Mil/uL — ABNORMAL LOW (ref 4.22–5.81)
RDW: 19.2 % — AB (ref 11.5–15.5)
WBC: 6.3 10*3/uL (ref 4.0–10.5)

## 2016-01-21 LAB — COMPREHENSIVE METABOLIC PANEL
ALT: 18 U/L (ref 0–53)
AST: 32 U/L (ref 0–37)
Albumin: 3.1 g/dL — ABNORMAL LOW (ref 3.5–5.2)
Alkaline Phosphatase: 97 U/L (ref 39–117)
BUN: 18 mg/dL (ref 6–23)
CO2: 23 meq/L (ref 19–32)
Calcium: 9.2 mg/dL (ref 8.4–10.5)
Chloride: 93 mEq/L — ABNORMAL LOW (ref 96–112)
Creatinine, Ser: 1.11 mg/dL (ref 0.40–1.50)
GFR: 72.04 mL/min (ref >60.00)
GLUCOSE: 117 mg/dL — AB (ref 70–99)
POTASSIUM: 4.4 meq/L (ref 3.5–5.1)
SODIUM: 123 meq/L — AB (ref 135–145)
Total Bilirubin: 3.8 mg/dL — ABNORMAL HIGH (ref 0.2–1.2)
Total Protein: 8 g/dL (ref 6.0–8.3)

## 2016-01-21 LAB — PROTIME-INR
INR: 1.9 ratio — AB (ref 0.8–1.0)
Prothrombin Time: 20.1 s — ABNORMAL HIGH (ref 9.6–13.1)

## 2016-01-21 LAB — CBG MONITORING, ED: Glucose-Capillary: 104 mg/dL — ABNORMAL HIGH (ref 65–99)

## 2016-01-21 MED ORDER — VITAMIN B-1 100 MG PO TABS
100.0000 mg | ORAL_TABLET | Freq: Every day | ORAL | Status: DC
  Administered 2016-01-22 – 2016-01-24 (×3): 100 mg via ORAL
  Filled 2016-01-21 (×3): qty 1

## 2016-01-21 MED ORDER — ONDANSETRON HCL 4 MG/2ML IJ SOLN: 4.0000 mg | Freq: Four times a day (QID) | INTRAMUSCULAR | Status: DC | PRN

## 2016-01-21 MED ORDER — ONDANSETRON HCL 4 MG PO TABS: 4.0000 mg | ORAL_TABLET | Freq: Four times a day (QID) | ORAL | Status: DC | PRN

## 2016-01-21 MED ORDER — SODIUM CHLORIDE 0.9 % IV SOLN
Freq: Once | INTRAVENOUS | Status: AC
  Administered 2016-01-21: 23:00:00 via INTRAVENOUS

## 2016-01-21 MED ORDER — PANTOPRAZOLE SODIUM 40 MG PO TBEC
40.0000 mg | DELAYED_RELEASE_TABLET | Freq: Every day | ORAL | Status: DC
  Administered 2016-01-22 – 2016-01-24 (×3): 40 mg via ORAL
  Filled 2016-01-21 (×3): qty 1

## 2016-01-21 MED ORDER — SODIUM CHLORIDE 0.9 % IV SOLN
INTRAVENOUS | Status: DC
  Administered 2016-01-21 – 2016-01-23 (×3): via INTRAVENOUS

## 2016-01-21 MED ORDER — FOLIC ACID 1 MG PO TABS
1.0000 mg | ORAL_TABLET | Freq: Every day | ORAL | Status: DC
Start: 2016-01-22 — End: 2016-01-24
  Administered 2016-01-22 – 2016-01-24 (×3): 1 mg via ORAL
  Filled 2016-01-21 (×3): qty 1

## 2016-01-21 NOTE — Patient Instructions (Addendum)
Patient seen in office today and advised to go to the ER for dizziness and orthostatic hypotension, diuretics recently increased and weight down 60 lbs in 2 weeks

## 2016-01-21 NOTE — ED Notes (Signed)
Pt reports generalized weakness and dizziness x 1 week. Larey SeatFell this pm complaints of right shoulder pain new abrasion to right shoulder

## 2016-01-21 NOTE — Progress Notes (Signed)
Agree with assessment and plans 

## 2016-01-21 NOTE — H&P (Signed)
[facility] History and Physical  REICE BIENVENUE WUJ:[id number] DOB: [birth year] DOA: 01/21/2016  Referring physician: Dr Particia Nearing PCP: No PCP Per Patient   Chief Complaint: Dizziness w standing, fall and hit shoulder   HPI: Devin Noble is a 60 y.o. male w history of etoh abuse, cirrhosis/ portal htn/ ascites. Was admitted here in Jan2017 for symptomatic ascites and had 11 L drained from abdomen. In April was here again w massive ascites and SOB and had 10 L ascites drained by IR and was dc'd on increased doses of lasix and aldactone. Now is having problems with lightheadness upon standing. He fell and hit his shoulder, unable to stand w/o dizziness/ unsteady on his feet. He was seen in the GI clinic today and had orthostatic drop in BP's reportedly upon standing and was unable to stand w/o getting dizzy.  Sent to ED and we are asked to admit for orthostatic hypotension in cirrhotic on high-dose po diuretics.    Patient lives alone, was married didn't have kids.  Worked as a Curator on school buses. Used to make a lot of things in his shop but hasn't worked there in months.  Quit drinking around January because of "what it's done to me".  Says he was drinking because of "boredom" primarily, lives alone.  Bruises easily, stays cold all the time. Says he has lost 60 lbs since last admission, is now 180's , was 240's last admit. No drug use. No family around "that I would like to contact".    No abd pain/ CP/ cough/ fever / chills.     Chart review:  mar '14 left shoulder replacement surg  aug'14 right shoulder replacement  jan '17 etoh cirrhosis, comm- acquired pna, ascites w/o SBP / 11L ascitic fluid drained,given albumin/ 2u prbc / fe def anemia , dc nsaids, started ppi. Lasix increased 40/day. Aldactone 100/ day at dc.   apr 12-14, 2017 > etoh cirrhosis w ascites/portal htn w ^'d ascites,abd pain and sob,quit etoh 3 mos prior, massive ascites and LE edema>  underwent tap of 10 L  ascitic fluid by IR on 01/08/16, 50 gm IV alb given; cell count low. US liver no HCC. AFP wnl. GI ^'d lasix to 80/d and aldactone to 200/d.   ROS  denies CP  no joint pain   no HA  no blurry vision  no rash  no diarrhea  no nausea/ vomiting  no dysuria  no difficulty voiding  no change in urine color    Where does patient live home Can patient participate in ADLs? yes  Past Medical History  Past Medical History  Diagnosis Date  . Hypertension     takes Lisinopril/HCTZ daily  . Birthmark of skin     "right eye" (01/30/2013)  . GERD (gastroesophageal reflux disease)     seldom  . Arthritis   . Cirrhosis (HCC)   . Abdominal hernia   . Restless leg syndrome    Past Surgical History  Past Surgical History  Procedure Laterality Date  . 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 Colonoscopy  ~ 2009  . Total shoulder arthroplasty Left 01/30/2013    Procedure: TOTAL SHOULDER ARTHROPLASTY;  Surgeon: Mable Paris, MD;  Location: [facility] OR;  Service: Orthopedics;  Laterality: Left;  . Total shoulder arthroplasty Right 05/24/2013    Dr Ave Filter  . Total shoulder arthroplasty Right 05/24/2013    Procedure: RIGHT TOTAL SHOULDER ARTHROPLASTY;  Surgeon: Mable Paris, MD;  Location: [facility] OR;  Service: Orthopedics;  Laterality: Right;  Right total shoulder replacement   Family History  Family History  Problem Relation Age of Onset  . Colon cancer Neg Hx   . Colon polyps Neg Hx   . Kidney disease Neg Hx   . Diabetes Neg Hx   . Gallbladder disease Mother   . Esophageal cancer Neg Hx   . Heart disease Neg Hx    Social History  reports that he has never smoked. He has never used smokeless tobacco. He reports that he drinks about 14.4 oz of alcohol per week. He reports that he does not use illicit drugs. Allergies  Allergies  Allergen Reactions  . Tramadol Hives and Itching   Home medications Prior to Admission medications   Medication Sig Start Date End Date Taking?  Authorizing Provider  [employer] B-1 100 MG tablet Take 1 tablet by mouth daily. 12/12/15  Yes Historical Provider, MD  diphenhydrAMINE (BENADRYL) 25 MG tablet Take 25 mg by mouth at bedtime as needed for sleep.   Yes Historical Provider, MD  folic acid (FOLVITE) 1 MG tablet Take 1 tablet (1 mg total) by mouth daily. 12/17/15  Yes [name] [name], PA-C  furosemide (LASIX) 80 MG tablet Take 1 tablet (80 mg total) by mouth daily. 01/09/16  Yes Jeralyn Bennett, MD  pantoprazole (PROTONIX) 40 MG tablet Take 1 tablet (40 mg total) by mouth daily. 11/10/15  Yes [name] [name], PA-C  spironolactone (ALDACTONE) 100 MG tablet Take 2 tablets (200 mg total) by mouth daily. 01/09/16  Yes Jeralyn Bennett, MD   Liver Function Tests  Recent Labs Lab 01/21/16 1422  AST 32  ALT 18  ALKPHOS 97  BILITOT 3.8*  PROT 8.0  ALBUMIN 3.1*   No results for input(s): LIPASE, AMYLASE in the last 168 hours. CBC  Recent Labs Lab 01/21/16 1422 01/21/16 1614  WBC 6.3 6.3  NEUTROABS 4.6  --   HGB 10.1* 9.9*  HCT 30.2* 27.8*  MCV 79.5 76.8*  PLT 269.0 521*   Basic Metabolic Panel  Recent Labs Lab 01/21/16 1422 01/21/16 1614  NA 123* 125*  K 4.4 4.3  CL 93* 95*  CO2 23 19*  GLUCOSE 117* 112*  BUN 18 19  CREATININE 1.11 1.22  CALCIUM 9.2 8.9     Filed Vitals:   01/21/16 1551 01/21/16 2015  BP: 123/81 116/81  Pulse: 98 91  Temp: 97.7 F (36.5 C)   TempSrc: Oral   Resp: 16 18  Height:  (1.803 m)   Weight: 81.647 kg (180 lb)   SpO2: 100% 100%   Exam: VSS   BP 80's initially now 110/70 w HR 95 Gen chron ill, muscle wasting, pleasant adult male no distress, calm No rash, cyanosis or gangrene R eyelid w "birthmark" Sclera anicteric, throat clear and slightly dry  No jvd or bruits Chest clear bilat RRR no MRG Abd soft ntnd +ascites mild-mod w caput medusae GU normal male MS no joint effusions or deformity Ext no LE or UE edema / no wounds or ulcers Neuro is alert, Ox 3 , nf ,no  asterixis   EKG (independently reviewed) > NSR no acute changes  Home medications: lasix 80 daily, aldactone 200 mg daily, protonix 40 daily, folic acid 1 mg  Na 125 K 4.3 BUn 19 Cr 1.22  Alb 3.1  Tbili 3.8  ast 32  Glu 104 WBC 6k  Hb 9.9 plt 521 INR 1.9   Assessment: 1. Fall/ orthostatic hypotension/volume depletion - in  known cirrhotic s/p recent large vol paracentesis earlier this month and now on higher diuretic doses. Has lost a lot of weight as a result.  Is intravascular vol depleted now.  Per patient his GI doctor is going to cut back on aldactone 200 > 150mg  /day.  Hold all diuretics tonight, give IVF's and f/u orthostatic BP's in the morning.  Doesn't appear septic.  2. 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  3. Hyponatremia - from vol depletion and/or cirrhosis 4. Anemia - Hb near baseline. Hx fe deficiency 5. Coagulopathy -from #2    Plan - IVF NS at 100 cc/hr. Hold diuretics.  Orthostatics in am.     DVT Prophylaxis none Code Status: full  Family Communication: none   Disposition Plan: home when ambulating better    Maree KrabbeSCHERTZ,Lareina Espino D [facility] Pager [phone]  Cell [phone]  If 7PM-7AM, please contact night-coverage [email or web address] Password [email or web address] 01/21/2016, 10:28 PM

## 2016-01-21 NOTE — ED Notes (Signed)
Patient presents for near syncope, reports feeling dizziness, fell and hit head on bathtub, denies LOC, denies anticoagulants. Recently released from inpatient. C/o dizziness, skin tears to left elbow and right upper forearm, bandages in place. No obvious deformity, A&O x4, grips equal bilaterally, no facial droop, no drift, no decreased sensation.

## 2016-01-21 NOTE — Progress Notes (Signed)
     01/21/2016 Devin Noble [id number] 30-Apr-1956   History of Present Illness:  This is a 60 year old male who is here for follow-up of his alcoholic cirrhosis with ascites and lower extremity edema. He was seen in the office by Dr. Marina GoodellPerry on April 12 and was sent to the emergency department due to massive ascites with tachycardia and shortness of breath. He spent a 2 day hospital stay and underwent a 10 L paracentesis, fluid negative for SBP on 4/13. His diuretics were increased to Lasix 80 mg daily and spironolactone 200 mg daily. He then underwent another paracentesis 6 days later on April 19 at which time he had 8 L of fluid removed. He does receive albumin with his paracenteses. He is here today for follow-up. His weight is down 60 pounds from 2 weeks ago. His main complaint today is of extreme dizziness upon standing and he actually fell in the bathroom this morning.  Has abrasions on his arms that were bandaged in our office today.  Denies loosing consciousness or hitting his head.  Current Medications, Allergies, Past Medical History, Past Surgical History, Family History and Social History were reviewed in [facility] electronic medical record.   Physical Exam: BP 80/50 mmHg  Pulse 104  Ht 5\' 11"  (1.803 m)  Wt 185 lb 2 oz (83.972 kg)  BMI 25.83 kg/m2 General: Well developed white male in no acute distress; chronically ill-appearing. Head: Normocephalic and atraumatic Eyes:  Sclerae anicteric, conjunctiva pink.   Right eye with birthmark and droopy appearance. Ears: Normal auditory acuity Lungs: Clear throughout to auscultation Heart: Slightly tachycardic.  No murmurs noted. Abdomen: Distended with ascites fluid, but soft.  BS present.  Non-tender. Musculoskeletal: Symmetrical with no gross deformities  Extremities: No edema.  Multiple abrasions on his arms that are bleeding and were bandaged in our office.  Neurological: Alert oriented x 4, grossly  non-focal Psychological:  Alert and cooperative. Normal mood and affect  Assessment and Recommendations: -60 year old male with advanced alcoholic cirrhosis with portal hypertension and massive ascites/lower extremity edema.  Recently has undergone 2 large volume paracenteses and diuretics increased. His weight is down 60 pounds from 2 weeks ago. He is complaining of dizziness upon standing and actually fell this morning. In our office he is orthostatic with a 30 point systolic drop in his blood pressure from lying to standing. Our plan as an outpatient was to check stat labs to make sure that his renal function was stable, to decrease his Lasix to 60 mg daily and Aldactone to 150 mg daily, and to have him receive 50 g of IV albumin through short stay tomorrow morning. While speaking with the patient he is uncomfortable with the plan of going home and I do not feel it is extremely safe for him to do so. Together we made the decision for him to go to the emergency department for possible observation admission to be monitored and undergo these changes at that time.  Will need eventual EGD as outpatient for variceal screening. We are going to get him scheduled to be seen at the liver clinic to become established there within the next couple of months.  **Last drink of ETOH was 11/08/2015.

## 2016-01-21 NOTE — ED Provider Notes (Signed)
CSN: [id number]     Arrival date & time 01/21/16  1535 History   First MD Initiated Contact with Patient 01/21/16 2009     Chief Complaint  Patient presents with  . Near Syncope    HPI   60 year old male presents at the request of gastroenterology. Patient has a history of alcoholic cirrhosis with ascites and lower extremity edema. Patient was recently discharged from hospital service on 01/09/2016 due to alcoholic liver failure. Patient has had large volume paracentesis, most notably with a 10 L removal on 01/08/2016; subsequently 8 L on 01/14/2016.Marland Kitchen. He was discharged on the 14th with instructions to increase Lasix to 80 mg in spironolactone 200 mg. Patient notes that since hospital discharge she's had dizziness upon standing. He describes a sensation of near syncope, and reports that after standing for approximately 1 minute he is able to walk without significant difficulty. He denies any chest pain or shortness of breath, headache, or any other concerning signs or symptoms. Patient notes that today he was walking into the bathroom when he had an episode of near syncope reporting that "my legs felt like rubber" and he fell hitting his head on the bathtub. He sustained a skin tear to his left elbow, but he denies complete loss of syncope, significant head trauma, neurological deficits, or any other complaints from the fall. Patient followed up with his gastroenterologist today, he was found to be hypotensive at 80/50, with orthostatic changes of 30 points of the systolic measurement. He was subsequently sent to the emergency room for hospital observation.  Patient's plan at gastroenterology was potentially to have stat labs and close follow-up with the decreasing his Lasix to 60 mg and Aldactone 150. Patient did not feel that this would be safe for him.  Patient has lost 60 pounds over the last 2 weeks.   Past Medical History  Diagnosis Date  . Hypertension     takes Lisinopril/HCTZ daily  .  Birthmark of skin     "right eye" (01/30/2013)  . GERD (gastroesophageal reflux disease)     seldom  . Arthritis   . Cirrhosis (HCC)   . Abdominal hernia   . Restless leg syndrome    Past Surgical History  Procedure Laterality Date  . 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 Colonoscopy  ~ 2009  . Total shoulder arthroplasty Left 01/30/2013    Procedure: TOTAL SHOULDER ARTHROPLASTY;  Surgeon: Mable ParisJustin William Chandler, MD;  [facility];  Service: Orthopedics;  Laterality: Left;  . Total shoulder arthroplasty Right 05/24/2013    Dr Ave Filterhandler  . Total shoulder arthroplasty Right 05/24/2013    Procedure: RIGHT TOTAL SHOULDER ARTHROPLASTY;  Surgeon: Mable ParisJustin William Chandler, MD;  [facility];  Service: Orthopedics;  Laterality: Right;  Right total shoulder replacement   Family History  Problem Relation Age of Onset  . Colon cancer Neg Hx   . Colon polyps Neg Hx   . Kidney disease Neg Hx   . Diabetes Neg Hx   . Gallbladder disease Mother   . Esophageal cancer Neg Hx   . Heart disease Neg Hx    Social History  Substance Use Topics  . Smoking status: Never Smoker   . Smokeless tobacco: Never Used  . Alcohol Use: 14.4 oz/week    24 Cans of beer per week     Comment: daily beer    Review of Systems  All other systems reviewed and are negative.   Allergies  Tramadol  Home Medications  Prior to Admission medications   Medication Sig Start Date End Date Taking? Authorizing Provider  [employer] B-1 100 MG tablet Take 1 tablet by mouth daily. 12/12/15  Yes Historical Provider, MD  diphenhydrAMINE (BENADRYL) 25 MG tablet Take 25 mg by mouth at bedtime as needed for sleep.   Yes Historical Provider, MD  folic acid (FOLVITE) 1 MG tablet Take 1 tablet (1 mg total) by mouth daily. 12/17/15  Yes [name] [name], PA-C  furosemide (LASIX) 80 MG tablet Take 1 tablet (80 mg total) by mouth daily. 01/09/16  Yes Jeralyn Bennett, MD  pantoprazole (PROTONIX) 40 MG tablet Take 1 tablet (40 mg total)  by mouth daily. 11/10/15  Yes [name] [name], PA-C  spironolactone (ALDACTONE) 100 MG tablet Take 2 tablets (200 mg total) by mouth daily. 01/09/16  Yes Jeralyn Bennett, MD    BP 109/73 mmHg  Pulse 106  Temp(Src) 97.8 F (36.6 C) (Oral)  Resp 17  Ht  (1.803 m)  Wt 80.1 kg  BMI 24.64 kg/m2  SpO2 98%   Physical Exam  Constitutional: He is oriented to person, place, and time. He appears well-developed and well-nourished.  HENT:  Head: Normocephalic and atraumatic.  Eyes: Conjunctivae are normal. Pupils are equal, round, and reactive to light. Right eye exhibits no discharge. Left eye exhibits no discharge. No scleral icterus.  Neck: Normal range of motion. No JVD present. No tracheal deviation present.  Cardiovascular: Normal rate, regular rhythm, normal heart sounds and intact distal pulses.  Exam reveals no gallop.   No murmur heard. Pulmonary/Chest: Effort normal and breath sounds normal. No stridor. No respiratory distress. He has no wheezes. He has no rales. He exhibits no tenderness.  Abdominal: Soft. He exhibits distension. He exhibits no mass. There is no tenderness. There is no rebound and no guarding.  Caput medusae    Neurological: He is alert and oriented to person, place, and time. Coordination normal.  Skin: Skin is warm and dry. No rash noted. No erythema. No pallor.  Psychiatric: He has a normal mood and affect. His behavior is normal. Judgment and thought content normal.  Nursing note and vitals reviewed.    ED Course  Procedures (including critical care time) Labs Review Labs Reviewed  BASIC METABOLIC PANEL - Abnormal; Notable for the following:    Sodium 125 (*)    Chloride 95 (*)    CO2 19 (*)    Glucose, Bld 112 (*)    All other components within normal limits  CBC - Abnormal; Notable for the following:    RBC 3.62 (*)    Hemoglobin 9.9 (*)    HCT 27.8 (*)    MCV 76.8 (*)    RDW 18.2 (*)    Platelets 521 (*)    All other components within normal  limits  CBG MONITORING, ED - Abnormal; Notable for the following:    Glucose-Capillary 104 (*)    All other components within normal limits  URINALYSIS, ROUTINE W REFLEX MICROSCOPIC (NOT AT [facility])  BASIC METABOLIC PANEL    Imaging Review No results found. I have personally reviewed and evaluated these images and lab results as part of my medical decision-making.   EKG Interpretation   Date/Time:  Wednesday January 21 2016 16:20:27 EDT Ventricular Rate:  97 PR Interval:  136 QRS Duration: 102 QT Interval:  459 QTC Calculation: 583 R Axis:   3 Text Interpretation:  Sinus rhythm Low voltage, extremity leads Consider  anterior infarct Borderline repolarization abnormality Prolonged  QT  interval since last tracing no significant change Confirmed by BELFI  MD,  MELANIE ([id number]) on 01/21/2016 4:50:15 PM      MDM   Final diagnoses:  Orthostatic hypotension  Alcoholic cirrhosis of liver with ascites (HCC)    Labs: CBC, BMP, hepatic function-   Imaging:  Consults:  Therapeutics:Normal saline  Discharge Meds:   Assessment/Plan: 60 year old male presents today with near-syncope likely related to hypertension. Patient has had significant amount of fluid taken off via paracentesis due to liver failure. Patient was hypotensive prior to arrival, he had low blood pressure while here in the ED. Due to patient's symptomatic hypotension and fall risk patient will be observed here in the hospital with gentle fluid hydration and reevaluation. Hospital agreed to hobs.        Eyvonne Mechanic, PA-C 01/22/16 [phone]

## 2016-01-22 ENCOUNTER — Inpatient Hospital Stay (HOSPITAL_COMMUNITY): Admission: RE | Admit: 2016-01-22 | Payer: Medicare Other | Source: Ambulatory Visit

## 2016-01-22 DIAGNOSIS — E861 Hypovolemia: Secondary | ICD-10-CM

## 2016-01-22 DIAGNOSIS — E871 Hypo-osmolality and hyponatremia: Secondary | ICD-10-CM

## 2016-01-22 DIAGNOSIS — Z96612 Presence of left artificial shoulder joint: Secondary | ICD-10-CM | POA: Diagnosis present

## 2016-01-22 DIAGNOSIS — W1839XA Other fall on same level, initial encounter: Secondary | ICD-10-CM | POA: Diagnosis present

## 2016-01-22 DIAGNOSIS — Z79899 Other long term (current) drug therapy: Secondary | ICD-10-CM | POA: Diagnosis not present

## 2016-01-22 DIAGNOSIS — I10 Essential (primary) hypertension: Secondary | ICD-10-CM | POA: Diagnosis present

## 2016-01-22 DIAGNOSIS — Z885 Allergy status to narcotic agent status: Secondary | ICD-10-CM | POA: Diagnosis not present

## 2016-01-22 DIAGNOSIS — R42 Dizziness and giddiness: Secondary | ICD-10-CM | POA: Diagnosis not present

## 2016-01-22 DIAGNOSIS — K7031 Alcoholic cirrhosis of liver with ascites: Secondary | ICD-10-CM | POA: Diagnosis not present

## 2016-01-22 DIAGNOSIS — S51012A Laceration without foreign body of left elbow, initial encounter: Secondary | ICD-10-CM | POA: Diagnosis present

## 2016-01-22 DIAGNOSIS — I951 Orthostatic hypotension: Secondary | ICD-10-CM | POA: Diagnosis present

## 2016-01-22 DIAGNOSIS — K219 Gastro-esophageal reflux disease without esophagitis: Secondary | ICD-10-CM | POA: Diagnosis present

## 2016-01-22 DIAGNOSIS — D649 Anemia, unspecified: Secondary | ICD-10-CM | POA: Diagnosis present

## 2016-01-22 DIAGNOSIS — D689 Coagulation defect, unspecified: Secondary | ICD-10-CM | POA: Diagnosis present

## 2016-01-22 DIAGNOSIS — R339 Retention of urine, unspecified: Secondary | ICD-10-CM | POA: Diagnosis present

## 2016-01-22 DIAGNOSIS — E869 Volume depletion, unspecified: Secondary | ICD-10-CM | POA: Diagnosis present

## 2016-01-22 DIAGNOSIS — K766 Portal hypertension: Secondary | ICD-10-CM | POA: Diagnosis present

## 2016-01-22 LAB — BASIC METABOLIC PANEL
ANION GAP: 10 (ref 5–15)
BUN: 21 mg/dL — ABNORMAL HIGH (ref 6–20)
CALCIUM: 8.4 mg/dL — AB (ref 8.9–10.3)
CHLORIDE: 98 mmol/L — AB (ref 101–111)
CO2: 21 mmol/L — AB (ref 22–32)
CREATININE: 0.91 mg/dL (ref 0.61–1.24)
GFR calc Af Amer: >60 mL/min (ref >60)
GFR calc non Af Amer: >60 mL/min (ref >60)
GLUCOSE: 97 mg/dL (ref 65–99)
Potassium: 4 mmol/L (ref 3.5–5.1)
Sodium: 129 mmol/L — ABNORMAL LOW (ref 135–145)

## 2016-01-22 LAB — URINALYSIS, ROUTINE W REFLEX MICROSCOPIC
GLUCOSE, UA: NEGATIVE mg/dL
HGB URINE DIPSTICK: NEGATIVE
Ketones, ur: NEGATIVE mg/dL
Leukocytes, UA: NEGATIVE
Nitrite: NEGATIVE
PH: 5.5 (ref 5.0–8.0)
Protein, ur: NEGATIVE mg/dL
SPECIFIC GRAVITY, URINE: 1.027 (ref 1.005–1.030)

## 2016-01-22 LAB — PROTIME-INR
INR: 1.79 — AB (ref 0.00–1.49)
Prothrombin Time: 20.8 seconds — ABNORMAL HIGH (ref 11.6–15.2)

## 2016-01-22 MED ORDER — ALBUMIN HUMAN 25 % IV SOLN
25.0000 g | Freq: Once | INTRAVENOUS | Status: AC
  Administered 2016-01-22: 12.5 g via INTRAVENOUS
  Filled 2016-01-22: qty 100

## 2016-01-22 MED ORDER — PHYTONADIONE 5 MG PO TABS
5.0000 mg | ORAL_TABLET | Freq: Once | ORAL | Status: AC
  Administered 2016-01-22: 5 mg via ORAL
  Filled 2016-01-22: qty 1

## 2016-01-22 NOTE — Care Management Obs Status (Signed)
MEDICARE OBSERVATION STATUS NOTIFICATION   Patient Details  Name: Margy Clarkserry L Santoro MRN: [id number] Date of Birth: [birth year]   Medicare Observation Status Notification Given:  Yes    Yves DillJeffries, Raejean Swinford Christine, RN 01/22/2016, 10:50 AM

## 2016-01-22 NOTE — Progress Notes (Addendum)
PROGRESS NOTE    Devin Noble  ZHY:[id number] DOB: 1956-06-27 DOA: 01/21/2016 PCP: No PCP Per Patient  Outpatient Specialists: Dr Marina GoodellPerry, GI    Brief Narrative: Devin Noble is a 60 y.o. male w history of etoh abuse, cirrhosis/ portal htn/ ascites. Was admitted here in Jan2017 for symptomatic ascites and had 11 L drained from abdomen. In April was here again w massive ascites and SOB and had 10 L ascites drained by IR and was dc'd on increased doses of lasix and aldactone. Now is having problems with lightheadness upon standing. He fell and hit his shoulder, unable to stand w/o dizziness/ unsteady on his feet. He was seen in the GI clinic today and had orthostatic drop in BP's reportedly upon standing and was unable to stand w/o getting dizzy. Sent to ED and we are asked to admit for orthostatic hypotension in cirrhotic on high-dose po diuretics.     Assessment & Plan:   Principal Problem:   Intravascular volume depletion Active Problems:   Alcoholic cirrhosis of liver with ascites (HCC)   Orthostatic hypotension   Fall   Hyponatremia   History of alcohol abuse - quit Jan 2017  1-Orthostatic Hypotension; persist this am. Continue with IV fluids. Hold diuretics. Will order albumin.  Repeat orthostatic in am.  Will check TSH and cortisol.   2-Alcoholic cirrhosis:  Stop drinking January.  Follows with Dr Marina Goodellperry.  Hold diuretics due to Number one.  Coagulopathy, INR at 1.7. Will order one dose of vitamin K.   3-Hyponatremia; from volume depletion and cirrhosis;  Improved.   4-Anemia - Hb near baseline. Hx fe deficiency  DVT prophylaxis: scd.  Code Status: full code.  Family Communication: care discussed with patient.  Disposition Plan: Remain inpatient for treatment orthostatic.    Consultants:   none  Procedures:  none  Antimicrobials:   none   Subjective: Felt dizzy on standing.    Objective: Filed Vitals:   01/22/16 1055 01/22/16 1057 01/22/16  1059 01/22/16 1228  BP: 99/60 93/60 80/53  108/75  Pulse: 104 116 120 103  Temp: 98.4 F (36.9 C) 98.4 F (36.9 C) 98.4 F (36.9 C) 98 F (36.7 C)  TempSrc: Oral Oral Oral Oral  Resp: 17 17 17 18   Height:      Weight:      SpO2: 97% 99% 98% 100%    Intake/Output Summary (Last 24 hours) at 01/22/16 1316 Last data filed at 01/22/16 1103  Gross per 24 hour  Intake    240 ml  Output    600 ml  Net   -360 ml   Filed Weights   01/21/16 1551 01/21/16 2336  Weight: 81.647 kg (180 lb) 80.1 kg (176 lb 9.4 oz)    Examination:  General exam: Appears calm and comfortable  Respiratory system: Clear to auscultation. Respiratory effort normal. Cardiovascular system: S1 & S2 heard, RRR. No JVD, murmurs, rubs, gallops or clicks. No pedal edema. Gastrointestinal system: Abdomen is distended, soft and nontender.  Normal bowel sounds heard. Central nervous system: Alert and oriented. No focal neurological deficits. Extremities: Symmetric 5 x 5 power. Skin: No rashes, lesions or ulcers Psychiatry: flat affect    Data Reviewed: I have personally reviewed following labs and imaging studies  CBC:  Recent Labs Lab 01/21/16 1422 01/21/16 1614  WBC 6.3 6.3  NEUTROABS 4.6  --   HGB 10.1* 9.9*  HCT 30.2* 27.8*  MCV 79.5 76.8*  PLT 269.0 521*   Basic Metabolic Panel:  Recent Labs Lab 01/21/16 1422 01/21/16 1614 01/22/16 0417  NA 123* 125* 129*  K 4.4 4.3 4.0  CL 93* 95* 98*  CO2 23 19* 21*  GLUCOSE 117* 112* 97  BUN 18 19 21*  CREATININE 1.11 1.22 0.91  CALCIUM 9.2 8.9 8.4*   GFR: Estimated Creatinine Clearance: 93.1 mL/min (by C-G formula based on Cr of 0.91). Liver Function Tests:  Recent Labs Lab 01/21/16 1422  AST 32  ALT 18  ALKPHOS 97  BILITOT 3.8*  PROT 8.0  ALBUMIN 3.1*   No results for input(s): LIPASE, AMYLASE in the last 168 hours. No results for input(s): AMMONIA in the last 168 hours. Coagulation Profile:  Recent Labs Lab 01/21/16 1422  01/22/16 0828  INR 1.9* 1.79*   Cardiac Enzymes: No results for input(s): CKTOTAL, CKMB, CKMBINDEX, TROPONINI in the last 168 hours. BNP (last 3 results) No results for input(s): PROBNP in the last 8760 hours. HbA1C: No results for input(s): HGBA1C in the last 72 hours. CBG:  Recent Labs Lab 01/21/16 1616  GLUCAP 104*   Lipid Profile: No results for input(s): CHOL, HDL, LDLCALC, TRIG, CHOLHDL, LDLDIRECT in the last 72 hours. Thyroid Function Tests: No results for input(s): TSH, T4TOTAL, FREET4, T3FREE, THYROIDAB in the last 72 hours. Anemia Panel: No results for input(s): VITAMINB12, FOLATE, FERRITIN, TIBC, IRON, RETICCTPCT in the last 72 hours. Urine analysis:    Component Value Date/Time   COLORURINE AMBER* 01/22/2016 0655   APPEARANCEUR CLOUDY* 01/22/2016 0655   LABSPEC 1.027 01/22/2016 0655   PHURINE 5.5 01/22/2016 0655   GLUCOSEU NEGATIVE 01/22/2016 0655   HGBUR NEGATIVE 01/22/2016 0655   BILIRUBINUR SMALL* 01/22/2016 0655   KETONESUR NEGATIVE 01/22/2016 0655   PROTEINUR NEGATIVE 01/22/2016 0655   UROBILINOGEN 1.0 05/16/2013 1115   NITRITE NEGATIVE 01/22/2016 0655   LEUKOCYTESUR NEGATIVE 01/22/2016 0655   Sepsis Labs: (procalcitonin:4,lacticidven:4)  )No results found for this or any previous visit (from the past 240 hour(s)).       Radiology Studies: No results found.      Scheduled Meds: . folic acid  1 mg Oral Daily  . pantoprazole  40 mg Oral Daily  . thiamine  100 mg Oral Daily   Continuous Infusions: . sodium chloride 100 mL/hr at 01/22/16 1000        Time spent: 35 minutes.     Alba Cory, MD [facility] Pager [phone]  If 7PM-7AM, please contact night-coverage [email or web address] Password [email or web address] 01/22/2016, 1:16 PM

## 2016-01-22 NOTE — Progress Notes (Signed)
01/22/16  0645 Nursing Patient unable to void this morning. Bladder scanned for greater than 900cc. Patient I/O cathed for 500cc dark urine per protocol.

## 2016-01-23 LAB — T4, FREE: FREE T4: 1.17 ng/dL — AB (ref 0.61–1.12)

## 2016-01-23 LAB — BASIC METABOLIC PANEL
ANION GAP: 8 (ref 5–15)
BUN: 19 mg/dL (ref 6–20)
CHLORIDE: 105 mmol/L (ref 101–111)
CO2: 19 mmol/L — AB (ref 22–32)
Calcium: 8.3 mg/dL — ABNORMAL LOW (ref 8.9–10.3)
Creatinine, Ser: 0.79 mg/dL (ref 0.61–1.24)
GFR calc non Af Amer: >60 mL/min (ref >60)
Glucose, Bld: 82 mg/dL (ref 65–99)
Potassium: 4.3 mmol/L (ref 3.5–5.1)
SODIUM: 132 mmol/L — AB (ref 135–145)

## 2016-01-23 LAB — TSH: TSH: 4.667 u[IU]/mL — AB (ref 0.350–4.500)

## 2016-01-23 LAB — CORTISOL-AM, BLOOD: Cortisol - AM: 4.9 ug/dL — ABNORMAL LOW (ref 6.7–22.6)

## 2016-01-23 MED ORDER — ALBUMIN HUMAN 25 % IV SOLN
25.0000 g | Freq: Once | INTRAVENOUS | Status: AC
  Administered 2016-01-23: 25 g via INTRAVENOUS
  Filled 2016-01-23: qty 100

## 2016-01-23 MED ORDER — TAMSULOSIN HCL 0.4 MG PO CAPS
0.4000 mg | ORAL_CAPSULE | Freq: Every day | ORAL | Status: DC
Start: 2016-01-23 — End: 2016-01-24
  Administered 2016-01-23 – 2016-01-24 (×2): 0.4 mg via ORAL
  Filled 2016-01-23 (×2): qty 1

## 2016-01-23 MED ORDER — COSYNTROPIN 0.25 MG IJ SOLR
0.2500 mg | Freq: Once | INTRAMUSCULAR | Status: AC
  Administered 2016-01-24: 0.25 mg via INTRAVENOUS
  Filled 2016-01-23: qty 0.25

## 2016-01-23 NOTE — Progress Notes (Signed)
PROGRESS NOTE    Devin Noble  ZOX:[id number] DOB: 1956/01/31 DOA: 01/21/2016 PCP: No PCP Per Patient  Outpatient Specialists: Dr Marina Goodell, GI    Brief Narrative: Devin Noble is a 60 y.o. male w history of etoh abuse, cirrhosis/ portal htn/ ascites. Was admitted here in Jan2017 for symptomatic ascites and had 11 L drained from abdomen. In April was here again w massive ascites and SOB and had 10 L ascites drained by IR and was dc'd on increased doses of lasix and aldactone. Now is having problems with lightheadness upon standing. He fell and hit his shoulder, unable to stand w/o dizziness/ unsteady on his feet. He was seen in the GI clinic today and had orthostatic drop in BP's reportedly upon standing and was unable to stand w/o getting dizzy. Sent to ED and we are asked to admit for orthostatic hypotension in cirrhotic on high-dose po diuretics.     Assessment & Plan:   Principal Problem:   Intravascular volume depletion Active Problems:   Alcoholic cirrhosis of liver with ascites (HCC)   Orthostatic hypotension   Fall   Hyponatremia   History of alcohol abuse - quit Jan 2017  1-Orthostatic Hypotension; Hold diuretics.  Improved. Will stop IV fluids to avoid overload. Will give 25 gr of albumin.  TSH and cortisol low. Check ACTH and free T 3 and T 4 level.  PT evaluation   2-Alcoholic cirrhosis:  Stop drinking January.  Follows with Dr Marina Goodell.  Hold diuretics due to Number one.  Coagulopathy, INR at 1.7. Received one dose of vitamin K.   3-Hyponatremia; from volume depletion and cirrhosis;  Improved.  NSL   4-Anemia - Hb near baseline. Hx fe deficiency  5-Urinary retention;  Will need foley catheter, patient is thinking about it.  Will start flomax.   DVT prophylaxis: scd.  Code Status: full code.  Family Communication: care discussed with patient.  Disposition Plan: home in 24 hours.    Consultants:   none  Procedures:  none  Antimicrobials:    none   Subjective: Denies dizziness on ambulation. Now having difficulty emptying his bladder.    Objective: Filed Vitals:   01/22/16 1540 01/22/16 2249 01/23/16 0625 01/23/16 0914  BP: 97/66 103/67 96/66 105/75  Pulse: 99 98 100 97  Temp: 98.5 F (36.9 C) 98.6 F (37 C) 98.6 F (37 C) 98 F (36.7 C)  TempSrc: Oral Oral Oral Oral  Resp: Height:      Weight:      SpO2: 99% 97% 98% 98%    Intake/Output Summary (Last 24 hours) at 01/23/16 1045 Last data filed at 01/23/16 0610  Gross per 24 hour  Intake   2170 ml  Output    885 ml  Net   1285 ml   Filed Weights   01/21/16 1551 01/21/16 2336  Weight: 81.647 kg (180 lb) 80.1 kg (176 lb 9.4 oz)    Examination:  General exam: Appears calm and comfortable  Respiratory system: Clear to auscultation. Respiratory effort normal. Cardiovascular system: S1 & S2 heard, RRR. No JVD, murmurs, rubs, gallops or clicks. No pedal edema. Gastrointestinal system: Abdomen is distended, soft and nontender.  Normal bowel sounds heard. Central nervous system: Alert and oriented. No focal neurological deficits. Extremities: Symmetric 5 x 5 power. Skin: No rashes, lesions or ulcers Psychiatry: flat affect    Data Reviewed: I have personally reviewed following labs and imaging studies  CBC:  Recent Labs Lab 01/21/16  1422 01/21/16 1614  WBC 6.3 6.3  NEUTROABS 4.6  --   HGB 10.1* 9.9*  HCT 30.2* 27.8*  MCV 79.5 76.8*  PLT 269.0 521*   Basic Metabolic Panel:  Recent Labs Lab 01/21/16 1422 01/21/16 1614 01/22/16 0417 01/23/16 0448  NA 123* 125* 129* 132*  K 4.4 4.3 4.0 4.3  CL 93* 95* 98* 105  CO2 23 19* 21* 19*  GLUCOSE 117* 112* 97 82  BUN 18 19 21* 19  CREATININE 1.11 1.22 0.91 0.79  CALCIUM 9.2 8.9 8.4* 8.3*   GFR: Estimated Creatinine Clearance: 105.9 mL/min (by C-G formula based on Cr of 0.79). Liver Function Tests:  Recent Labs Lab 01/21/16 1422  AST 32  ALT 18  ALKPHOS 97  BILITOT 3.8*   PROT 8.0  ALBUMIN 3.1*   No results for input(s): LIPASE, AMYLASE in the last 168 hours. No results for input(s): AMMONIA in the last 168 hours. Coagulation Profile:  Recent Labs Lab 01/21/16 1422 01/22/16 0828  INR 1.9* 1.79*   Cardiac Enzymes: No results for input(s): CKTOTAL, CKMB, CKMBINDEX, TROPONINI in the last 168 hours. BNP (last 3 results) No results for input(s): PROBNP in the last 8760 hours. HbA1C: No results for input(s): HGBA1C in the last 72 hours. CBG:  Recent Labs Lab 01/21/16 1616  GLUCAP 104*   Lipid Profile: No results for input(s): CHOL, HDL, LDLCALC, TRIG, CHOLHDL, LDLDIRECT in the last 72 hours. Thyroid Function Tests:  Recent Labs  01/23/16 0448  TSH 4.667*   Anemia Panel: No results for input(s): VITAMINB12, FOLATE, FERRITIN, TIBC, IRON, RETICCTPCT in the last 72 hours. Urine analysis:    Component Value Date/Time   COLORURINE AMBER* 01/22/2016 0655   APPEARANCEUR CLOUDY* 01/22/2016 0655   LABSPEC 1.027 01/22/2016 0655   PHURINE 5.5 01/22/2016 0655   GLUCOSEU NEGATIVE 01/22/2016 0655   HGBUR NEGATIVE 01/22/2016 0655   BILIRUBINUR SMALL* 01/22/2016 0655   KETONESUR NEGATIVE 01/22/2016 0655   PROTEINUR NEGATIVE 01/22/2016 0655   UROBILINOGEN 1.0 05/16/2013 1115   NITRITE NEGATIVE 01/22/2016 0655   LEUKOCYTESUR NEGATIVE 01/22/2016 0655   Sepsis Labs: @LABRCNTIP (procalcitonin:4,lacticidven:4)  )No results found for this or any previous visit (from the past 240 hour(s)).       Radiology Studies: No results found.      Scheduled Meds: . albumin human  25 g Intravenous Once  . [START ON 01/24/2016] cosyntropin  0.25 mg Intravenous Once  . folic acid  1 mg Oral Daily  . pantoprazole  40 mg Oral Daily  . tamsulosin  0.4 mg Oral Daily  . thiamine  100 mg Oral Daily   Continuous Infusions:     LOS: 1 day    Time spent: 35 minutes.     Alba Coryegalado, Trentin Knappenberger A, MD [facility] Pager [phone]  If 7PM-7AM,  please contact night-coverage [email or web address] Password [email or web address] 01/23/2016, 10:45 AM

## 2016-01-23 NOTE — Progress Notes (Signed)
Pt c/o having difficulty urinating, pt bladder scanned for obtained order for intermittent cath. Pt catherized for . Scanned again for an additional 998 ml. Floor coverage Harduk notified. Placed order for foley, foley placed with only 60ml drained, pt. C/o of pain and requested the foley be removed. Dr.Harduk made aware. Stated to continue with intermittent cath as needed.

## 2016-01-23 NOTE — Progress Notes (Signed)
Foley inserted with small amount < 100 cc's dark urine.

## 2016-01-23 NOTE — Progress Notes (Signed)
PT Cancellation Note  Patient Details Name: Devin Noble MRN: [id number] DOB: [birth year]   Cancelled Treatment:    Reason Eval/Treat Not Completed: Patient at procedure or test/unavailable (getting ready to be catheterized.)will check back this PM   Rada HayHill, [name] 01/23/2016, 12:08 PM Blanchard KelchKaren Margit Batte PT [phone]

## 2016-01-24 LAB — ACTH STIMULATION, 3 TIME POINTS
CORTISOL 60 MIN: 14.1 ug/dL
Cortisol, 30 Min: 17.7 ug/dL
Cortisol, Base: 9.1 ug/dL

## 2016-01-24 LAB — T3, FREE: T3 FREE: 2.4 pg/mL (ref 2.0–4.4)

## 2016-01-24 NOTE — Progress Notes (Signed)
Patient refused bed alarm.  Educated on fall precautions and patient refused.

## 2016-01-24 NOTE — Evaluation (Signed)
Physical Therapy Evaluation Patient Details Name: Devin Noble MRN: [id number] DOB: [birth year] Today's Date: 01/24/2016   History of Present Illness  Devin Noble is a 60 y.o. male w history of etoh abuse, cirrhosis/ portal htn/ ascites. On 4/27 was seen in the  GI clinic and had orthostatic drop in BP's reportedly upon standing  Sent to ED and we are asked to admit for orthostatic hypotension in cirrhotic on high-dose po diuretics  Clinical Impression  The patient  Ambulates without assistance.  BP-supine 114/79 HR 110       Sit 92/78                 120        Stand 98/61           124        Post 200' ambulation 101/69  HR 125 No complaints of dizziness.. No further PT needs.  Follow Up Recommendations No PT follow up    Equipment Recommendations  None recommended by PT    Recommendations for Other Services       Precautions / Restrictions Precautions Precautions: Fall Precaution Comments: hypotension Restrictions Weight Bearing Restrictions: No      Mobility  Bed Mobility Overal bed mobility: Independent                Transfers Overall transfer level: Independent                  Ambulation/Gait Ambulation/Gait assistance: Independent Ambulation Distance (Feet): 200 Feet Assistive device: None Gait Pattern/deviations: WFL(Within Functional Limits)   Gait velocity interpretation: at or above normal speed for age/gender    Stairs            Wheelchair Mobility    Modified Rankin (Stroke Patients Only)       Balance Overall balance assessment: Independent                                           Pertinent Vitals/Pain Pain Assessment: Faces Faces Pain Scale: Hurts little more Pain Location: all over Pain Descriptors / Indicators: Aching Pain Intervention(s): Monitored during session    Home Living Family/patient expects to be discharged to:: Private residence Living Arrangements: Alone Available  Help at Discharge: Friend(s) Type of Home: House Home Access: Stairs to enter Entrance Stairs-Rails: None Secretary/administratorntrance Stairs-Number of Steps: 2 Home Layout: One level Home Equipment: None      Prior Function Level of Independence: Independent               Hand Dominance        Extremity/Trunk Assessment   Upper Extremity Assessment: Overall WFL for tasks assessed           Lower Extremity Assessment: Overall WFL for tasks assessed      Cervical / Trunk Assessment: Normal  Communication      Cognition Arousal/Alertness: Awake/alert Behavior During Therapy: Flat affect;WFL for tasks assessed/performed Overall Cognitive Status: Within Functional Limits for tasks assessed                      General Comments      Exercises        Assessment/Plan    PT Assessment Patent does not need any further PT services  PT Diagnosis Generalized weakness   PT Problem List    PT Treatment Interventions  PT Goals (Current goals can be found in the Care Plan section) Acute Rehab PT Goals Patient Stated Goal: go home PT Goal Formulation: All assessment and education complete, DC therapy    Frequency     Barriers to discharge        Co-evaluation               End of Session Equipment Utilized During Treatment: Gait belt Activity Tolerance: Patient tolerated treatment well Patient left: in bed;with call bell/phone within reach;with bed alarm set Nurse Communication: Mobility status         Time: 1610-9604 PT Time Calculation (min) (ACUTE ONLY): 13 min   Charges:   PT Evaluation $PT Eval Low Complexity: 1 Procedure     PT G CodesRada Hay 01/24/2016, 11:24 AM Blanchard Kelch PT [phone]

## 2016-01-24 NOTE — Discharge Summary (Signed)
Physician Discharge Summary  ELIBERTO SOLE Noble:[id number] DOB: December 25, 1955 DOA: 01/21/2016  PCP: No PCP Per Patient  Admit date: 01/21/2016 Discharge date: 01/24/2016  Time spent: 35 minutes  Recommendations for Outpatient Follow-up:  Might need referral to endocrinologist. Needs repeat TSH, and Free T 3 and Free 4.  Might need paracentesis.  Resume lasix as BP tolerates it.    Discharge Diagnoses:    Orthostatic hypotension   Intravascular volume depletion   Alcoholic cirrhosis of liver with ascites (HCC)   Fall   Hyponatremia   History of alcohol abuse - quit Jan 2017   Discharge Condition: stable  Diet recommendation: heart healthy   Filed Weights   01/21/16 1551 01/21/16 2336 01/24/16 0449  Weight: 81.647 kg (180 lb) 80.1 kg (176 lb 9.4 oz) 37.1 kg (81 lb 12.7 oz)    History of present illness:  Devin Noble is a 60 y.o. male w history of etoh abuse, cirrhosis/ portal htn/ ascites. Was admitted here in Jan2017 for symptomatic ascites and had 11 L drained from abdomen. In April was here again w massive ascites and SOB and had 10 L ascites drained by IR and was dc'd on increased doses of lasix and aldactone. Now is having problems with lightheadness upon standing. He fell and hit his shoulder, unable to stand w/o dizziness/ unsteady on his feet. He was seen in the GI clinic today and had orthostatic drop in BP's reportedly upon standing and was unable to stand w/o getting dizzy. Sent to ED and we are asked to admit for orthostatic hypotension in cirrhotic on high-dose po diuretics.   Patient lives alone, was married didn't have kids. Worked as a Curator on school buses. Used to make a lot of things in his shop but hasn't worked there in months. Quit drinking around January because of "what it's done to me". Says he was drinking because of "boredom" primarily, lives alone. Bruises easily, stays cold all the time. Says he has lost 60 lbs since last admission, is now 180's  , was 240's last admit. No drug use. No family around "that I would like to contact".   Hospital Course:   Brief Narrative: Devin Noble is a 60 y.o. male w history of etoh abuse, cirrhosis/ portal htn/ ascites. Was admitted here in Jan2017 for symptomatic ascites and had 11 L drained from abdomen. In April was here again w massive ascites and SOB and had 10 L ascites drained by IR and was dc'd on increased doses of lasix and aldactone. Now is having problems with lightheadness upon standing. He fell and hit his shoulder, unable to stand w/o dizziness/ unsteady on his feet. He was seen in the GI clinic today and had orthostatic drop in BP's reportedly upon standing and was unable to stand w/o getting dizzy. Sent to ED and we are asked to admit for orthostatic hypotension in cirrhotic on high-dose po diuretics.     Assessment & Plan:  Principal Problem:  Intravascular volume depletion Active Problems:  Alcoholic cirrhosis of liver with ascites (HCC)  Orthostatic hypotension  Fall  Hyponatremia  History of alcohol abuse - quit Jan 2017  1-Orthostatic Hypotension; Hold diuretics.  Improved. Will stop IV fluids to avoid overload. Received 50 gr albumin.  cortisol low.  ACTH test with normal cortisol. TSH mildly elevated,  Free t 3 normal, T 4  mildly elevated PT evaluation , was able to ambulate with PT, no symptoms.  still mildly orthostatic by HR, but symptoms  free. Would avoid more IV fluids to avoid worsening ascites.  Will hold diuretics at discharge. Needs to follow up with GI early next week,.  2-Alcoholic cirrhosis:  Stop drinking January.  Follows with Dr Marina Goodell.  Hold diuretics due to Number one.  Coagulopathy, INR at 1.7. Received one dose of vitamin K.  Will likely need paracentesis next appointment if BP allows it.   3-Hyponatremia; from volume depletion and cirrhosis;  Improved.  NSL   4-Anemia - Hb near baseline. Hx fe deficiency  5-Urinary  retention;  Foley place yesterday, but not significant amount of urine came out.  Will discontinue foley and if able to urinate, plan to discharge today  I dont think he will need flomax.   Procedures:  none  Consultations: None  Discharge Exam: Filed Vitals:   01/23/16 2051 01/24/16 0449  BP: 102/72 106/69  Pulse: 116 102  Temp: 97.8 F (36.6 C) 98.2 F (36.8 C)  Resp: 16 16    General: NAD Cardiovascular: S 1, S 2 RRR Respiratory: CTA  Discharge Instructions   Discharge Instructions    Diet - low sodium heart healthy    Complete by:  As directed      Increase activity slowly    Complete by:  As directed           Current Discharge Medication List    CONTINUE these medications which have NOT CHANGED   Details  [employer] B-1 100 MG tablet Take 1 tablet by mouth daily. Refills: 2    diphenhydrAMINE (BENADRYL) 25 MG tablet Take 25 mg by mouth at bedtime as needed for sleep.    folic acid (FOLVITE) 1 MG tablet Take 1 tablet (1 mg total) by mouth daily. Qty: 30 tablet, Refills: 11    pantoprazole (PROTONIX) 40 MG tablet Take 1 tablet (40 mg total) by mouth daily. Qty: 30 tablet, Refills: 3      STOP taking these medications     furosemide (LASIX) 80 MG tablet      spironolactone (ALDACTONE) 100 MG tablet        Allergies  Allergen Reactions  . Tramadol Hives and Itching   Follow-up Information    Follow up with Yancey Flemings, MD.   Specialty:  Gastroenterology   Contact information:   [address] [place] [address] [phone]        The results of significant diagnostics from this hospitalization (including imaging, microbiology, ancillary and laboratory) are listed below for reference.    Significant Diagnostic Studies: US Abdomen Complete  01/08/2016  ADDENDUM REPORT: 01/08/2016 18:54 ADDENDUM: Disregard the original report. Paracentesis report was inadvertently put on this report. See below for the abdominal ultrasound report.  INDICATION: Cirrhosis. COMPARISON:  01/04/2015 FINDINGS: Gallbladder: Gallbladder is surrounded by ascites. Gallbladder wall is thickened measuring up to 0.7 cm. No evidence for gallstones. CBD:  Measures 0.2 cm. Liver: The liver is diffusely nodular and heterogeneous. Findings are suggestive for cirrhosis. Main portal vein is patent. A large amount of ascites around the liver. IVC:  Grossly normal. Pancreas: Not visualized due to bowel gas. Spleen: Spleen is enlarged measuring 15.5 x 16.4 x 6.7 cm. Calculated volume is 910 mL. Spleen echogenicity is within normal limits without a focal abnormality. Right kidney: Measures 12.9 cm. Echogenicity is within normal limits. No hydronephrosis. No suspicious lesion. Left kidney: Measures 12.1 cm. Normal echogenicity. No hydronephrosis. No suspicious lesions. Aorta: No aneurysm. Limited evaluation of the distal abdominal aorta. Other: Large amount of ascites throughout  the abdomen. IMPRESSION: Large amount of ascites. Cirrhotic liver. Gallbladder wall is thickened and this may be secondary to the ascites. No evidence for gallstones. No biliary dilatation. Splenomegaly compatible with portal hypertension. Electronically Signed   By: Richarda Overlie M.D.   On: 01/08/2016 18:54  01/08/2016  INDICATION: This is a patient with advanced alcoholic cirrhosis who presents with recurrent ascites. Request has been made for diagnostic and therapeutic paracentesis. EXAM: ULTRASOUND-GUIDED DIAGNOSTIC AND THERAPEUTIC PARACENTESIS MEDICATIONS: 1% lidocaine COMPLICATIONS: None immediate. TECHNIQUE: Informed written consent was obtained from the patient after a discussion of the risks, benefits and alternatives to treatment. A timeout was performed prior to the initiation of the procedure. Initial ultrasound scanning demonstrates a large amount of ascites within the right upper abdominal quadrant. The right upper abdomen was prepped and draped in the usual sterile fashion. 1% lidocaine was used  for local anesthesia. A, 7-cm, Yueh catheter was introduced. The paracentesis was performed. The catheter was removed and a dressing was applied. The patient tolerated the procedure well without immediate post procedural complication. FINDINGS: A total of approximately 10 liters of cloudy yellow fluid was removed. Samples were sent to the laboratory as requested by the clinical team. IMPRESSION: Successful ultrasound-guided paracentesis yielding 10 liters of peritoneal fluid. Read by: Barnetta Chapel, PA-C Electronically Signed: By: Richarda Overlie M.D. On: 01/08/2016 14:20   US Paracentesis  01/14/2016  INDICATION: Ascites EXAM: ULTRASOUND-GUIDED PARACENTESIS COMPARISON:  Previous paracentesis MEDICATIONS: 10 cc 1% lidocaine COMPLICATIONS: None immediate. TECHNIQUE: Informed written consent was obtained from the patient after a discussion of the risks, benefits and alternatives to treatment. A timeout was performed prior to the initiation of the procedure. Initial ultrasound scanning demonstrates a large amount of ascites within the left low abdominal quadrant. The left lower abdomen was prepped and draped in the usual sterile fashion. 1% lidocaine with epinephrine was used for local anesthesia. Under direct ultrasound guidance, a 19 gauge, 7-cm, Yueh catheter was introduced. An ultrasound image was saved for documentation purposed. The paracentesis was performed. The catheter was removed and a dressing was applied. The patient tolerated the procedure well without immediate post procedural complication. FINDINGS: A total of approximately 8 liters of milky yellow fluid was removed. Samples were sent to the laboratory as requested by the clinical team. IMPRESSION: Successful ultrasound-guided paracentesis yielding 8 liters of peritoneal fluid. 8 liter maximum per MD Post procedure IV Albumin 62.5 gr per MD (8 gr/L) Read by:  Robet Leu [facility] Electronically Signed   By: Jolaine Click M.D.   On: 01/14/2016 13:15   US  Paracentesis  01/08/2016  INDICATION: Patient with advanced alcoholic cirrhosis who presents with recurrent ascites. Request is made for diagnostic and therapeutic paracentesis. EXAM: ULTRASOUND GUIDED PARACENTESIS MEDICATIONS: None. COMPLICATIONS: None immediate. PROCEDURE: Informed written consent was obtained from the patient after a discussion of the risks, benefits and alternatives to treatment. A timeout was performed prior to the initiation of the procedure. Initial ultrasound scanning demonstrates a large amount of ascites within the right right upper abdominal quadrant. The right lower abdomen was prepped and draped in the usual sterile fashion. 1% lidocaine with epinephrine was used for local anesthesia. Following this, a 7 cm Yueh catheter was introduced. An ultrasound image was saved for documentation purposes. The paracentesis was performed. The catheter was removed and a dressing was applied. The patient tolerated the procedure well without immediate post procedural complication. FINDINGS: A total of approximately 10 L of cloudy yellow fluid was removed. Samples were  sent to the laboratory as requested by the clinical team. IMPRESSION: Successful ultrasound-guided paracentesis yielding 10 L liters of peritoneal fluid. Procedure performed by Barnetta ChapelKelly Osborne, PA-C Electronically Signed   By: Richarda OverlieAdam  Henn M.D.   On: 01/08/2016 16:45   Dg Chest Port 1 View  01/07/2016  CLINICAL DATA:  Extreme shortness of breath with wheezing and cough for 2 days. History of cirrhosis with portal hypertension. EXAM: PORTABLE CHEST 1 VIEW COMPARISON:  09/30/2015 and 01/04/2015 radiographs. Abdominal CT 01/04/2015. FINDINGS: 1654 hours. Two views obtained. Overall lower lung volumes. The left pleural effusion has enlarged with increasing left basilar pulmonary opacity. There is no significant pleural fluid on the right. The right lung is clear for the degree of inspiration. The visualized heart size and mediastinal contours  are stone. Patient is status post bilateral total shoulder arthroplasty. IMPRESSION: Enlarging left pleural effusion with worsening left basilar airspace disease. Consider communication between the left pleural space and peritoneal cavity in this patient with a history of cirrhosis and ascites. Electronically Signed   By: Carey BullocksWilliam  Veazey M.D.   On: 01/07/2016 17:12    Microbiology: No results found for this or any previous visit (from the past 240 hour(s)).   Labs: Basic Metabolic Panel:  Recent Labs Lab 01/21/16 1422 01/21/16 1614 01/22/16 0417 01/23/16 0448  NA 123* 125* 129* 132*  K 4.4 4.3 4.0 4.3  CL 93* 95* 98* 105  CO2 23 19* 21* 19*  GLUCOSE 117* 112* 97 82  BUN 18 19 21* 19  CREATININE 1.11 1.22 0.91 0.79  CALCIUM 9.2 8.9 8.4* 8.3*   Liver Function Tests:  Recent Labs Lab 01/21/16 1422  AST 32  ALT 18  ALKPHOS 97  BILITOT 3.8*  PROT 8.0  ALBUMIN 3.1*   No results for input(s): LIPASE, AMYLASE in the last 168 hours. No results for input(s): AMMONIA in the last 168 hours. CBC:  Recent Labs Lab 01/21/16 1422 01/21/16 1614  WBC 6.3 6.3  NEUTROABS 4.6  --   HGB 10.1* 9.9*  HCT 30.2* 27.8*  MCV 79.5 76.8*  PLT 269.0 521*   Cardiac Enzymes: No results for input(s): CKTOTAL, CKMB, CKMBINDEX, TROPONINI in the last 168 hours. BNP: BNP (last 3 results) No results for input(s): BNP in the last 8760 hours.  ProBNP (last 3 results) No results for input(s): PROBNP in the last 8760 hours.  CBG:  Recent Labs Lab 01/21/16 1616  GLUCAP 104*       Signed:  Hartley Barefootegalado, Glenetta Kiger A MD.  [facility] 01/24/2016, 1:35 PM

## 2016-01-24 NOTE — Progress Notes (Signed)
Patient voided 200 ml of urine. Will discharge to home as ordered.

## 2016-01-27 ENCOUNTER — Telehealth: Payer: Self-pay

## 2016-01-27 NOTE — Telephone Encounter (Signed)
Pt aware.

## 2016-01-27 NOTE — Telephone Encounter (Signed)
-----   Message from Chrystie NoseLinda R Carnelia Oscar, RN sent at 01/13/2016  3:34 PM EDT ----- Regarding: BMET Pt needs BMET in 2 weeks. Order in epic.

## 2016-01-30 ENCOUNTER — Telehealth: Payer: Self-pay | Admitting: Gastroenterology

## 2016-01-30 ENCOUNTER — Other Ambulatory Visit (INDEPENDENT_AMBULATORY_CARE_PROVIDER_SITE_OTHER): Payer: Medicare Other

## 2016-01-30 ENCOUNTER — Other Ambulatory Visit: Payer: Self-pay

## 2016-01-30 DIAGNOSIS — K7031 Alcoholic cirrhosis of liver with ascites: Secondary | ICD-10-CM

## 2016-01-30 DIAGNOSIS — R188 Other ascites: Secondary | ICD-10-CM

## 2016-01-30 LAB — BASIC METABOLIC PANEL
BUN: 18 mg/dL (ref 6–23)
CHLORIDE: 101 meq/L (ref 96–112)
CO2: 21 mEq/L (ref 19–32)
Calcium: 8.7 mg/dL (ref 8.4–10.5)
Creatinine, Ser: 0.75 mg/dL (ref 0.40–1.50)
GFR: 113.25 mL/min (ref >60.00)
GLUCOSE: 130 mg/dL — AB (ref 70–99)
POTASSIUM: 4 meq/L (ref 3.5–5.1)
SODIUM: 131 meq/L — AB (ref 135–145)

## 2016-01-30 MED ORDER — FUROSEMIDE 20 MG PO TABS: 60.0000 mg | ORAL_TABLET | Freq: Every day | ORAL | Status: DC

## 2016-01-30 MED ORDER — SPIRONOLACTONE 100 MG PO TABS: 100.0000 mg | ORAL_TABLET | Freq: Every day | ORAL | Status: DC

## 2016-01-30 NOTE — Telephone Encounter (Signed)
Labs look ok.  Go ahead and schedule paracentesis for next week with albumin infusion.  Ascites fluid to be sent for cell count, culture/gram stain.  How is he feeling?  Anymore dizziness or falls?  Can you please check with Pam to see if she got him scheduled to be seen at the liver clinic at all (it was a non-urgent referral but just want to make sure there is an appt arranged for him)?  Thank you,  Jess

## 2016-01-30 NOTE — Telephone Encounter (Signed)
Patient is scheduled for [facility] on 02/03/16 11:30.  He reports that he is still dizzy, but no falls.  [facility] at [facility]:15.  He is advised to start back on Furosemide 60 mg and aldactone 100 mg daily. He is scheduled for follow up on 02/10/16 10:00

## 2016-01-30 NOTE — Telephone Encounter (Signed)
Patient reports that he would like to have a paracentesis. He has not been taking his weights daily.  He reports that he is uncomfortable and short of breath and would ike to have a paracentesis.  He had a BMET drawn this am.  Patient aware we will call him back once labs are back and Doug SouJessica [name], [place] or Dr. Marina GoodellPerry has reviewed.

## 2016-02-03 ENCOUNTER — Ambulatory Visit (HOSPITAL_COMMUNITY)
Admission: RE | Admit: 2016-02-03 | Discharge: 2016-02-03 | Disposition: A | Discharge status: Home / Self Care | Payer: Medicare Other | Source: Ambulatory Visit | Attending: Gastroenterology | Admitting: Gastroenterology

## 2016-02-03 DIAGNOSIS — R188 Other ascites: Secondary | ICD-10-CM | POA: Diagnosis not present

## 2016-02-03 LAB — BODY FLUID CELL COUNT WITH DIFFERENTIAL
Eos, Fluid: 0 %
Lymphs, Fluid: 68 %
MONOCYTE-MACROPHAGE-SEROUS FLUID: 27 % — AB (ref 50–90)
NEUTROPHIL FLUID: 5 % (ref 0–25)
WBC FLUID: 79 uL (ref 0–1000)

## 2016-02-03 LAB — GRAM STAIN

## 2016-02-03 MED ORDER — ALBUMIN HUMAN 25 % IV SOLN: 64.0000 g | Freq: Once | INTRAVENOUS | Status: DC

## 2016-02-03 MED ORDER — LIDOCAINE HCL (PF) 1 % IJ SOLN
INTRAMUSCULAR | Status: AC
  Filled 2016-02-03: qty 10

## 2016-02-03 MED ORDER — ALBUMIN HUMAN 25 % IV SOLN
62.5000 g | Freq: Once | INTRAVENOUS | Status: AC
  Administered 2016-02-03: 62.5 g via INTRAVENOUS
  Filled 2016-02-03: qty 300

## 2016-02-03 NOTE — Procedures (Addendum)
Ultrasound-guided diagnostic and therapeutic paracentesis performed yielding 8 liters (maximum ordered) chylous (milky yellow) fluid. No immediate complications. A portion of the fluid was sent to the lab for preordered studies. The pt will receive IV albumin postprocedure.

## 2016-02-04 LAB — PATHOLOGIST SMEAR REVIEW

## 2016-02-06 ENCOUNTER — Telehealth: Payer: Self-pay | Admitting: *Deleted

## 2016-02-06 NOTE — Telephone Encounter (Signed)
Advised the patient that he has an [facility] for 03-09-2016 at 2:00 PM. The patient said he was called by them and advised of the appointment. I also reminded him of his appointment with Doug SouJessica [name] PA-C on 02-10-2016 at 10:00 AM. The patient thanked me for calling.

## 2016-02-08 LAB — CULTURE, BODY FLUID-BOTTLE: CULTURE: NO GROWTH

## 2016-02-08 LAB — CULTURE, BODY FLUID W GRAM STAIN -BOTTLE

## 2016-02-10 ENCOUNTER — Encounter: Payer: Self-pay | Admitting: Gastroenterology

## 2016-02-10 ENCOUNTER — Ambulatory Visit (INDEPENDENT_AMBULATORY_CARE_PROVIDER_SITE_OTHER): Payer: Medicare Other | Admitting: Gastroenterology

## 2016-02-10 VITALS — BP 100/68 | HR 84 | Ht 71.0 in | Wt 198.2 lb

## 2016-02-10 DIAGNOSIS — F1011 Alcohol abuse, in remission: Secondary | ICD-10-CM

## 2016-02-10 DIAGNOSIS — F1010 Alcohol abuse, uncomplicated: Secondary | ICD-10-CM

## 2016-02-10 DIAGNOSIS — K7031 Alcoholic cirrhosis of liver with ascites: Secondary | ICD-10-CM | POA: Diagnosis not present

## 2016-02-10 NOTE — Patient Instructions (Addendum)
Increase the Spironolactone to 150 mg daily.  [facility] is 03-09-2016 at 2:00 PM.   We made you an appointment with Dr. Yancey FlemingsJohn Aul on 02-26-2016 at 10:00 am.

## 2016-02-13 ENCOUNTER — Other Ambulatory Visit: Payer: Self-pay

## 2016-02-13 ENCOUNTER — Other Ambulatory Visit (INDEPENDENT_AMBULATORY_CARE_PROVIDER_SITE_OTHER): Payer: Medicare Other

## 2016-02-13 DIAGNOSIS — K7031 Alcoholic cirrhosis of liver with ascites: Secondary | ICD-10-CM

## 2016-02-13 DIAGNOSIS — R188 Other ascites: Secondary | ICD-10-CM

## 2016-02-13 LAB — BASIC METABOLIC PANEL
BUN: 14 mg/dL (ref 6–23)
CALCIUM: 9 mg/dL (ref 8.4–10.5)
CHLORIDE: 99 meq/L (ref 96–112)
CO2: 19 meq/L (ref 19–32)
Creatinine, Ser: 0.99 mg/dL (ref 0.40–1.50)
GFR: 82.19 mL/min (ref >60.00)
Glucose, Bld: 157 mg/dL — ABNORMAL HIGH (ref 70–99)
POTASSIUM: 4.2 meq/L (ref 3.5–5.1)
SODIUM: 126 meq/L — AB (ref 135–145)

## 2016-02-16 ENCOUNTER — Encounter: Payer: Self-pay | Admitting: Gastroenterology

## 2016-02-16 ENCOUNTER — Other Ambulatory Visit: Payer: Self-pay

## 2016-02-16 DIAGNOSIS — K7469 Other cirrhosis of liver: Secondary | ICD-10-CM

## 2016-02-16 NOTE — Progress Notes (Signed)
     02/10/2016 Devin Noble [id number] November 14, 1955   History of Present Illness:  60 year old male who is here for follow-up of his ETOH cirrhosis with ascites and lower extremity edema.  He was seen in the office by Dr. Marina GoodellPerry on April 12 and was sent to the emergency department due to massive ascites with tachycardia and shortness of breath. He spent a 2 day hospital stay and underwent a 10 L paracentesis, fluid negative for SBP on 4/13. His diuretics were increased to Lasix 80 mg daily and spironolactone 200 mg daily. He then underwent another paracentesis 6 days later on April 19 at which time he had 8 L of fluid removed. He does receive albumin with his paracenteses.  He was then seen by me on 4/26 at which time his weight is down 60 pounds from the 2 weeks prior. His main complaint was of extreme dizziness upon standing, which caused him to fall at home.  He was readmitted to the hospital on 4/26 for intravascular volume depletion and orthostatic hypotension.  All of his diuretics were stopped.  He called our office on 5/5 and was restarted back on lasix 60 mg daily and spironolactone 100 mg daily.  Is here today for follow-up.  Weight is back up 13 pounds again.  Says that he still gets a little dizzy at times but not nearly as bad as previously.  He says that he has been really thirsty so has probably drank more fluids than he should have.  Does not want to get another paracentesis yet because he has a fairly high co-pay each time that he goes so he would like to call us to schedule when he feels that he needs it.   Current Medications, Allergies, Past Medical History, Past Surgical History, Family History and Social History were [facility] electronic [facility].  Physical Exam: BP 100/68 mmHg  Pulse 84  Ht 5\' 11"  (1.803 m)  Wt 198 lb 3.2 oz (89.903 kg)  BMI 27.66 kg/m2 General: Well developed male in no acute distress Head: Normocephalic and atraumatic Eyes:   Sclerae anicteric, conjunctiva pink  Ears: Normal auditory acuity Lungs: Clear throughout to auscultation Heart: Regular rate and rhythm Abdomen: Soft, extremely distended with ascites fluid.  Normal bowel sounds. Musculoskeletal: Symmetrical with no gross deformities.  Extremities: No edema  Neurological: Alert oriented x 4, grossly non-focal Psychological:  Alert and cooperative. Normal mood and affect  Assessment and Recommendations: -60 year old male with advanced alcoholic cirrhosis with portal hypertension and massive ascites/lower extremity edema.  Diuretics had to be adjust a lot recently due to massive fluid then too aggressive measures with diuretics and paracenteses lead to orthostatic hypotension and falls at home due to intravascular volume depletion.  Finally was restarted back on lasix 60 mg daily and spironolactone 100 mg daily on 5/5.  Seems to be tolerating that well.  I will increase his spironolactone up to 150 mg daily.  He would like to hold off on paracentesis again for now because of the co-pay associated with it and will call when he feels that he needs it.  [facility] and follow-up with Dr. Marina GoodellPerry on 6/1.  I think that he needs close follow-up until his fluid management is under better control.  We discussed again low sodium diet and fluid restriction.    **Last drink of ETOH was 11/08/2015.

## 2016-02-16 NOTE — Progress Notes (Signed)
Advanced liver disease with intractable ascites. Agree with plans and close follow-up including tertiary liver clinic

## 2016-02-18 ENCOUNTER — Ambulatory Visit (HOSPITAL_COMMUNITY)
Admission: RE | Admit: 2016-02-18 | Discharge: 2016-02-18 | Disposition: A | Discharge status: Home / Self Care | Payer: Medicare Other | Source: Ambulatory Visit | Attending: Internal Medicine | Admitting: Internal Medicine

## 2016-02-18 DIAGNOSIS — R188 Other ascites: Secondary | ICD-10-CM | POA: Insufficient documentation

## 2016-02-18 DIAGNOSIS — K7460 Unspecified cirrhosis of liver: Secondary | ICD-10-CM | POA: Insufficient documentation

## 2016-02-18 LAB — BODY FLUID CELL COUNT WITH DIFFERENTIAL
EOS FL: 0 %
Lymphs, Fluid: 34 %
MONOCYTE-MACROPHAGE-SEROUS FLUID: 65 % (ref 50–90)
NEUTROPHIL FLUID: 1 % (ref 0–25)
WBC FLUID: 97 uL (ref 0–1000)

## 2016-02-18 MED ORDER — LIDOCAINE HCL (PF) 1 % IJ SOLN
INTRAMUSCULAR | Status: AC
  Filled 2016-02-18: qty 10

## 2016-02-18 MED ORDER — ALBUMIN HUMAN 25 % IV SOLN
62.5000 g | Freq: Once | INTRAVENOUS | Status: AC
  Administered 2016-02-18: 62.5 g via INTRAVENOUS
  Filled 2016-02-18: qty 300

## 2016-02-18 NOTE — Procedures (Signed)
Ultrasound-guided diagnostic and therapeutic paracentesis performed yielding 8 liters of chylous colored fluid. No immediate complications.  Devin Noble E 11:07 AM 02/18/2016

## 2016-02-19 LAB — PATHOLOGIST SMEAR REVIEW

## 2016-02-20 ENCOUNTER — Other Ambulatory Visit (INDEPENDENT_AMBULATORY_CARE_PROVIDER_SITE_OTHER): Payer: Medicare Other

## 2016-02-20 ENCOUNTER — Other Ambulatory Visit: Payer: Self-pay | Admitting: *Deleted

## 2016-02-20 DIAGNOSIS — K7469 Other cirrhosis of liver: Secondary | ICD-10-CM

## 2016-02-20 DIAGNOSIS — K7031 Alcoholic cirrhosis of liver with ascites: Secondary | ICD-10-CM

## 2016-02-20 LAB — BASIC METABOLIC PANEL
BUN: 14 mg/dL (ref 6–23)
CALCIUM: 9 mg/dL (ref 8.4–10.5)
CHLORIDE: 99 meq/L (ref 96–112)
CO2: 21 mEq/L (ref 19–32)
CREATININE: 0.95 mg/dL (ref 0.40–1.50)
GFR: 86.2 mL/min (ref >60.00)
Glucose, Bld: 146 mg/dL — ABNORMAL HIGH (ref 70–99)
Potassium: 4.3 mEq/L (ref 3.5–5.1)
Sodium: 127 mEq/L — ABNORMAL LOW (ref 135–145)

## 2016-02-26 ENCOUNTER — Other Ambulatory Visit: Payer: Self-pay

## 2016-02-26 ENCOUNTER — Telehealth: Payer: Self-pay

## 2016-02-26 ENCOUNTER — Encounter: Payer: Self-pay | Admitting: Internal Medicine

## 2016-02-26 ENCOUNTER — Ambulatory Visit (INDEPENDENT_AMBULATORY_CARE_PROVIDER_SITE_OTHER): Payer: Medicare Other | Admitting: Internal Medicine

## 2016-02-26 VITALS — BP 90/60 | HR 104 | Ht 71.0 in | Wt 196.1 lb

## 2016-02-26 DIAGNOSIS — F1010 Alcohol abuse, uncomplicated: Secondary | ICD-10-CM

## 2016-02-26 DIAGNOSIS — E871 Hypo-osmolality and hyponatremia: Secondary | ICD-10-CM

## 2016-02-26 DIAGNOSIS — K7031 Alcoholic cirrhosis of liver with ascites: Secondary | ICD-10-CM

## 2016-02-26 NOTE — Telephone Encounter (Signed)
Erroneous encounter

## 2016-02-26 NOTE — Patient Instructions (Signed)
You have been scheduled for an endoscopy. Please follow written instructions given to you at your visit today. If you use inhalers (even only as needed), please bring them with you on the day of your procedure.   

## 2016-02-26 NOTE — Progress Notes (Signed)
HISTORY OF PRESENT ILLNESS:  Devin Noble is a 60 y.o. male with advanced alcoholic cirrhosis complicated by refractory ascites and deconditioning. The patient has been followed since January 2016 when he was hospitalized with hepatic cirrhosis and ascites. This year he has had problems with massive ascites with associated breathing difficulty requiring hospitalization as well as diuretic induced syncope. Also problems with hyponatremia. He presents today for follow-up. Last evaluated by the GI physician assistant 02/10/2016. See that dictation. His weight that time was 190 pounds. Recent basic metabolic panel with sodium 127. Normal renal function and potassium. His current diuretic regimen is furosemide 60 mg daily and Aldactone 150 mg daily. He did undergo interval paracentesis of 8 L May 24. No SBP. His weight today is 196 pounds. He continues to complain of ascites, though non-tense currently. Other complaints include extreme fatigue, insomnia, and itching all over. He denies problems with mentation. He does have an appointment with [facility] liver clinic 03/09/2016. No significant problems with dizziness at this time. He continues to reports abstinence from alcohol since 11/08/2015  REVIEW OF SYSTEMS:  All non-GI ROS negative except for pruritus, insomnia, fatigue, weakness, arthritis  Past Medical History  Diagnosis Date  . Hypertension     takes Lisinopril/HCTZ daily  . Birthmark of skin     "right eye" (01/30/2013)  . GERD (gastroesophageal reflux disease)     seldom  . Arthritis   . Cirrhosis (HCC)   . Abdominal hernia   . Restless leg syndrome   . Ascites   . Orthostatic hypotension   . Esophageal varices (HCC)     Past Surgical History  Procedure Laterality Date  . 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 Colonoscopy  ~ 2009  . Total shoulder arthroplasty Left 01/30/2013    Procedure: TOTAL SHOULDER ARTHROPLASTY;  Surgeon: Mable Paris, MD;  Location: [facility] OR;   Service: Orthopedics;  Laterality: Left;  . Total shoulder arthroplasty Right 05/24/2013    Dr Ave Filter  . Total shoulder arthroplasty Right 05/24/2013    Procedure: RIGHT TOTAL SHOULDER ARTHROPLASTY;  Surgeon: Mable Paris, MD;  Location: [facility] OR;  Service: Orthopedics;  Laterality: Right;  Right total shoulder replacement    Social [name]  reports that he has never smoked. He has never used smokeless tobacco. He reports that he drinks about 14.4 oz of alcohol per week. He reports that he does not use illicit drugs.  family history includes Gallbladder disease in his mother. There is no history of Colon cancer, Colon polyps, Kidney disease, Diabetes, Esophageal cancer, or Heart disease.  Allergies  Allergen Reactions  . Tramadol Hives and Itching       PHYSICAL EXAMINATION: Vital signs: BP 90/60 mmHg  Pulse 104  Ht  (1.803 m)  Wt 196 lb 2 oz (88.962 kg)  BMI 27.37 kg/m2 . 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 Increase tongue hue Neck: supple without thyromegaly Lymph no lymphadenopathy Cardiovascular: heart regular rate and rhythm, no murmur Lungs: clear to auscultation bilaterally Abdomen: soft, nontender, with obvious ascites and non-tense umbilical hernia, no peritoneal signs, normal bowel sounds, no organomegaly. Caput medusa present Rectal: Omitted Extremities: no clubbing or cyanosis. 1+ lower extremity edema bilaterally.  Skin: Multiple excoriations of the extremity skin with excoriations. Spider  angiomata as well Neuro: No focal deficits. Appears to have mild asterixis, (though difficult for him to straighten his elbows due to prior orthopedic  issues)  ASSESSMENT:  #1. Advanced alcoholic hepatic cirrhosis with portal hypertension, refractory ascites, hyponatremia and possible mild encephalopathy. MELD SCORE 26. Three-month survival estimated at 20%. Discussed this with him #2. Chronic alcoholism. Abstinent since February 2017  PLAN:  #1. Continue furosemide 60 mg daily #2. Continue Aldactone 150 mg daily. #3. Keep liver clinic appointment 03/09/2016 #4. Water restriction for hyponatremia as discussed previously #5. Moisturizer for dry itchy skin #6. Schedule upper endoscopy to screen for varices. If significant, would need banding. Discussed this in detail with him. Will perform at the hospital with MAC. [facility]The nature of the procedure, as well as the risks (including but not limited to severe procedure related bleeding, chest pain, dysphagia, infection, respiratory compromise), benefits (prevent variceal bleeding), and alternatives (not appropriate candidate for beta blocker and reviewed risks of doing nothing) were carefully and thoroughly reviewed with the patient. Ample time for discussion and questions allowed. The patient understood, was satisfied, and agreed to proceed. #7. Routine office follow-up in about 3 months. Sooner if needed #8. Patient tells me that he needs to establish with new PCP. Previously Dr. Jeannetta NapElkins. I encouraged him to get a new PCP ASAP   45 minutes has been spent face-to-face with the patient. Greater than 50% of the time use for counseling regarding his severe liver disease and plans as outlined

## 2016-03-03 ENCOUNTER — Other Ambulatory Visit: Payer: Self-pay

## 2016-03-03 ENCOUNTER — Telehealth: Payer: Self-pay | Admitting: Internal Medicine

## 2016-03-03 DIAGNOSIS — K7031 Alcoholic cirrhosis of liver with ascites: Secondary | ICD-10-CM

## 2016-03-03 NOTE — Telephone Encounter (Signed)
Pt states that his abdomen is swollen again and he is uncomfortable. Pt is requesting to have a paracentesis done. Please advise.

## 2016-03-03 NOTE — Telephone Encounter (Signed)
Pt scheduled for us para at [facility] max amt to be removed 10 liters, fluid to be sent for cell count and diff, Pt to receive albumin 8gm IV for each liter removed. [facility]1pm , pt to arrive there at 12:45pm. Pt aware of appt.

## 2016-03-03 NOTE — Telephone Encounter (Signed)
Large volume paracentesis. Send fluid for cell count with differential. He may have up to 10 L withdrawn with albumin replacement. Thanks

## 2016-03-08 ENCOUNTER — Ambulatory Visit (HOSPITAL_COMMUNITY)
Admission: RE | Admit: 2016-03-08 | Discharge: 2016-03-08 | Disposition: A | Discharge status: Home / Self Care | Payer: Medicare Other | Source: Ambulatory Visit | Attending: Internal Medicine | Admitting: Internal Medicine

## 2016-03-08 DIAGNOSIS — R188 Other ascites: Secondary | ICD-10-CM | POA: Insufficient documentation

## 2016-03-08 DIAGNOSIS — K7031 Alcoholic cirrhosis of liver with ascites: Secondary | ICD-10-CM

## 2016-03-08 LAB — BODY FLUID CELL COUNT WITH DIFFERENTIAL
Lymphs, Fluid: 63 %
Monocyte-Macrophage-Serous Fluid: 36 % — ABNORMAL LOW (ref 50–90)
NEUTROPHIL FLUID: 1 % (ref 0–25)
Total Nucleated Cell Count, Fluid: 27 cu mm (ref 0–1000)

## 2016-03-08 MED ORDER — ALBUMIN HUMAN 25 % IV SOLN
75.0000 g | Freq: Once | INTRAVENOUS | Status: AC
  Administered 2016-03-08: 75 g via INTRAVENOUS
  Filled 2016-03-08: qty 300

## 2016-03-08 MED ORDER — LIDOCAINE HCL (PF) 1 % IJ SOLN
INTRAMUSCULAR | Status: AC
  Filled 2016-03-08: qty 10

## 2016-03-08 NOTE — Procedures (Signed)
   US guided RLQ paracentesis  10 liter max per MD  Tolerated well  8 gr/L Albumin per MD 80 gr Albumin IV ordered

## 2016-03-09 DIAGNOSIS — K7030 Alcoholic cirrhosis of liver without ascites: Secondary | ICD-10-CM | POA: Diagnosis not present

## 2016-03-10 LAB — PATHOLOGIST SMEAR REVIEW

## 2016-03-29 ENCOUNTER — Telehealth: Payer: Self-pay | Admitting: Internal Medicine

## 2016-03-29 ENCOUNTER — Encounter (HOSPITAL_COMMUNITY): Payer: Self-pay | Admitting: *Deleted

## 2016-03-29 DIAGNOSIS — K7031 Alcoholic cirrhosis of liver with ascites: Secondary | ICD-10-CM

## 2016-03-31 ENCOUNTER — Ambulatory Visit (HOSPITAL_COMMUNITY): Payer: Medicare Other | Admitting: Certified Registered"

## 2016-03-31 ENCOUNTER — Ambulatory Visit (HOSPITAL_COMMUNITY)
Admission: RE | Admit: 2016-03-31 | Discharge: 2016-03-31 | Disposition: A | Discharge status: Home / Self Care | Payer: Medicare Other | Source: Ambulatory Visit | Attending: Internal Medicine | Admitting: Internal Medicine

## 2016-03-31 ENCOUNTER — Encounter (HOSPITAL_COMMUNITY)
Admission: RE | Disposition: A | Discharge status: Home / Self Care | Payer: Self-pay | Source: Ambulatory Visit | Attending: Internal Medicine

## 2016-03-31 ENCOUNTER — Other Ambulatory Visit: Payer: Self-pay

## 2016-03-31 ENCOUNTER — Encounter (HOSPITAL_COMMUNITY): Payer: Self-pay

## 2016-03-31 DIAGNOSIS — I8500 Esophageal varices without bleeding: Secondary | ICD-10-CM | POA: Diagnosis not present

## 2016-03-31 DIAGNOSIS — Z96612 Presence of left artificial shoulder joint: Secondary | ICD-10-CM | POA: Insufficient documentation

## 2016-03-31 DIAGNOSIS — R188 Other ascites: Secondary | ICD-10-CM

## 2016-03-31 DIAGNOSIS — K3189 Other diseases of stomach and duodenum: Secondary | ICD-10-CM | POA: Insufficient documentation

## 2016-03-31 DIAGNOSIS — Z13810 Encounter for screening for upper gastrointestinal disorder: Secondary | ICD-10-CM | POA: Diagnosis not present

## 2016-03-31 DIAGNOSIS — K7460 Unspecified cirrhosis of liver: Secondary | ICD-10-CM | POA: Diagnosis not present

## 2016-03-31 DIAGNOSIS — Z886 Allergy status to analgesic agent status: Secondary | ICD-10-CM | POA: Diagnosis not present

## 2016-03-31 DIAGNOSIS — I8510 Secondary esophageal varices without bleeding: Secondary | ICD-10-CM | POA: Insufficient documentation

## 2016-03-31 DIAGNOSIS — I10 Essential (primary) hypertension: Secondary | ICD-10-CM | POA: Diagnosis not present

## 2016-03-31 DIAGNOSIS — G2581 Restless legs syndrome: Secondary | ICD-10-CM | POA: Insufficient documentation

## 2016-03-31 DIAGNOSIS — K766 Portal hypertension: Secondary | ICD-10-CM | POA: Diagnosis not present

## 2016-03-31 DIAGNOSIS — Z96611 Presence of right artificial shoulder joint: Secondary | ICD-10-CM | POA: Diagnosis not present

## 2016-03-31 DIAGNOSIS — K219 Gastro-esophageal reflux disease without esophagitis: Secondary | ICD-10-CM | POA: Diagnosis not present

## 2016-03-31 DIAGNOSIS — M1990 Unspecified osteoarthritis, unspecified site: Secondary | ICD-10-CM | POA: Diagnosis not present

## 2016-03-31 DIAGNOSIS — K7031 Alcoholic cirrhosis of liver with ascites: Secondary | ICD-10-CM

## 2016-03-31 HISTORY — DX: Pneumonia, unspecified organism: J18.9

## 2016-03-31 HISTORY — DX: Major depressive disorder, single episode, unspecified: F32.9

## 2016-03-31 HISTORY — PX: ESOPHAGOGASTRODUODENOSCOPY: SHX5428

## 2016-03-31 HISTORY — DX: Depression, unspecified: F32.A

## 2016-03-31 HISTORY — DX: Reserved for inherently not codable concepts without codable children: IMO0001

## 2016-03-31 SURGERY — EGD (ESOPHAGOGASTRODUODENOSCOPY)
Anesthesia: Monitor Anesthesia Care

## 2016-03-31 MED ORDER — LACTATED RINGERS IV SOLN
INTRAVENOUS | Status: DC
  Administered 2016-03-31: 09:00:00 via INTRAVENOUS

## 2016-03-31 MED ORDER — PROPOFOL 10 MG/ML IV BOLUS
INTRAVENOUS | Status: DC | PRN
  Administered 2016-03-31 (×3): 20 mg via INTRAVENOUS

## 2016-03-31 MED ORDER — BUTAMBEN-TETRACAINE-BENZOCAINE 2-2-14 % EX AERO
INHALATION_SPRAY | CUTANEOUS | Status: DC | PRN
  Administered 2016-03-31: 1 via TOPICAL

## 2016-03-31 MED ORDER — ALBUMIN HUMAN 25 % IV SOLN: 12.5000 g | Freq: Once | INTRAVENOUS | Status: DC

## 2016-03-31 MED ORDER — SODIUM CHLORIDE 0.9 % IV SOLN: INTRAVENOUS | Status: DC

## 2016-03-31 MED ORDER — PROPOFOL 500 MG/50ML IV EMUL
INTRAVENOUS | Status: DC | PRN
  Administered 2016-03-31: 50 ug/kg/min via INTRAVENOUS

## 2016-03-31 NOTE — Transfer of Care (Signed)
Immediate Anesthesia Transfer of Care Note  Patient: Devin Noble  Procedure(s) Performed: Procedure(s): ESOPHAGOGASTRODUODENOSCOPY (EGD) (N/A)  Patient Location: Endoscopy Unit  Anesthesia Type:MAC  Level of Consciousness: lethargic and responds to stimulation  Airway & Oxygen Therapy: Patient Spontanous Breathing  Post-op Assessment: Report given to RN  Post vital signs: Reviewed and stable  Last Vitals:  Filed Vitals:   03/31/16 0823 03/31/16 0946  BP: 103/51   Pulse: 96 88  Temp: 36.6 C   Resp: 15 14    Last Pain: There were no vitals filed for this visit.       Complications: No apparent anesthesia complications

## 2016-03-31 NOTE — Telephone Encounter (Signed)
[facility]10am , pt to arrive there at 9:45am. Pt to receive albumin 8gm IV for each liter removed, max of 10 liters to be drawn off. Pt to come for BMET this week. Will call pt tomorrow regarding appts.

## 2016-03-31 NOTE — Op Note (Signed)
[facility] Patient Name: Devin Noble Procedure Date : 03/31/2016 MRN: [id number] Attending MD: Wilhemina BonitoJohn N. Marina GoodellPerry , MD Date of Birth: 1955/12/08 CSN: [id number] Age: [age] Admit Type: Outpatient Procedure:                Upper GI endoscopy Indications:              Screening procedure, Cirrhosis with suspected                            esophageal varices Providers:                Wilhemina BonitoJohn N. Marina GoodellPerry, MD, Dwain SarnaPatricia Ford, RN, Kandice RobinsonsGuillaume                            [place], Technician Referring MD:              Medicines:                Monitored Anesthesia Care Complications:            No immediate complications. Estimated Blood Loss:     Estimated blood loss: none. Procedure:                Pre-Anesthesia Assessment:                           - Prior to the procedure, a History and Physical                            was performed, and patient medications and                            allergies were reviewed. The patient's tolerance of                            previous anesthesia was also reviewed. The risks                            and benefits of the procedure and the sedation                            options and risks were discussed with the patient.                            All questions were answered, and informed consent                            was obtained. Prior Anticoagulants: The patient has                            taken no previous anticoagulant or antiplatelet                            agents. ASA Grade Assessment: III - A patient with  severe systemic disease. After reviewing the risks                            and benefits, the patient was deemed in                            satisfactory condition to undergo the procedure.                           After obtaining informed consent, the endoscope was                            passed under direct vision. Throughout the                            procedure, the patient's blood  pressure, pulse, and                            oxygen saturations were monitored continuously. The                            was introduced through the mouth, and advanced to                            the second part of duodenum. The upper GI endoscopy                            was accomplished without difficulty. The patient                            tolerated the procedure well. Scope In: Scope Out: Findings:      Grade II varices were found in the lower third of the esophagus.      The exam of the esophagus was otherwise normal.      Moderate portal hypertensive gastropathy was found in the gastric antrum.      The exam of the stomach was otherwise normal.      The cardia and gastric fundus were normal on retroflexion.      The examined duodenum was normal. Impression:               - Grade II esophageal varix without stigmata.                           - Portal hypertensive gastropathy, distal and                            moderate.                           - Normal examined duodenum.                           - No specimens collected. Moderate Sedation:      none Recommendation:           - Patient has a contact number available for  emergencies. The signs and symptoms of potential                            delayed complications were discussed with the                            patient. Return to normal activities tomorrow.                            Written discharge instructions were provided to the                            patient.                           - Resume previous diet.                           - Continue present medications.                           - Return to GI clinic in 3 months. Would plan                            repeat upper endoscopy for screening in 1 year. Procedure Code(s):        --- Professional ---                           [phone] [id number], Esophagogastroduodenoscopy, flexible,                            transoral;  diagnostic, including collection of                            specimen(s) by brushing or washing, when performed                            (separate procedure) Diagnosis Code(s):        --- Professional ---                           K74.60, Unspecified cirrhosis of liver                           I85.10, Secondary esophageal varices without                            bleeding                           K76.6, Portal hypertension                           K31.89, Other diseases of stomach and duodenum                           Z13.810, Encounter for screening for upper  gastrointestinal disorder CPT copyright 2016 American Medical Association. All rights reserved. The codes documented in this report are preliminary and upon coder review may  be revised to meet current compliance requirements. Wilhemina Bonito. Marina Goodell, MD 03/31/2016 9:47:53 AM This report has been signed electronically. Number of Addenda: 0

## 2016-03-31 NOTE — H&P (Signed)
HISTORY OF PRESENT ILLNESS:  Devin Noble is a 60 y.o. male with advanced alcoholic cirrhosis complicated by refractory ascites and deconditioning. The patient has been followed since January 2016 when he was hospitalized with hepatic cirrhosis and ascites. This year he has had problems with massive ascites with associated breathing difficulty requiring hospitalization as well as diuretic induced syncope. Also problems with hyponatremia. He presents today for follow-up. Last evaluated by the GI physician assistant 02/10/2016. See that dictation. His weight that time was 190 pounds. Recent basic metabolic panel with sodium 127. Normal renal function and potassium. His current diuretic regimen is furosemide 60 mg daily and Aldactone 150 mg daily. He did undergo interval paracentesis of 8 L May 24. No SBP. His weight today is 196 pounds. He continues to complain of ascites, though non-tense currently. Other complaints include extreme fatigue, insomnia, and itching all over. He denies problems with mentation. He does have [facility]/09/2016. No significant problems with dizziness at this time. He continues to reports abstinence from alcohol since 11/08/2015  REVIEW OF SYSTEMS:  All non-GI ROS negative except for pruritus, insomnia, fatigue, weakness, arthritis  Past Medical History  Diagnosis Date  . Hypertension     takes Lisinopril/HCTZ daily  . Birthmark of skin     "right eye" (01/30/2013)  . GERD (gastroesophageal reflux disease)     seldom  . Arthritis   . Cirrhosis (HCC)   . Abdominal hernia   . Restless leg syndrome   . Ascites   . Orthostatic hypotension   . Esophageal varices (HCC)     Past Surgical History  Procedure Laterality Date  . 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 Colonoscopy  ~ 2009  . Total shoulder arthroplasty Left 01/30/2013    Procedure: TOTAL SHOULDER  ARTHROPLASTY; Surgeon: Mable ParisJustin William Chandler, MD; [facility]; Service: Orthopedics; Laterality: Left;  . Total shoulder arthroplasty Right 05/24/2013    Dr Ave Filterhandler  . Total shoulder arthroplasty Right 05/24/2013    Procedure: RIGHT TOTAL SHOULDER ARTHROPLASTY; Surgeon: Mable ParisJustin William Chandler, MD; [facility]; Service: Orthopedics; Laterality: Right; Right total shoulder replacement    Social History Devin Clarkserry L Vi  reports that he has never smoked. He has never used smokeless tobacco. He reports that he drinks about 14.4 oz of alcohol per week. He reports that he does not use illicit drugs.  family history includes Gallbladder disease in his mother. There is no history of Colon cancer, Colon polyps, Kidney disease, Diabetes, Esophageal cancer, or Heart disease.  Allergies  Allergen Reactions  . Tramadol Hives and Itching      PHYSICAL EXAMINATION: Vital signs: BP 90/60 mmHg  Pulse 104  Ht 5\' 11"  (1.803 m)  Wt 196 lb 2 oz (88.962 kg)  BMI 27.37 kg/m2 . 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 Increase tongue hue Neck: supple without thyromegaly Lymph no lymphadenopathy Cardiovascular: heart regular rate and rhythm, no murmur Lungs: clear to auscultation bilaterally Abdomen: soft, nontender, with obvious ascites and non-tense umbilical hernia, no peritoneal signs, normal bowel sounds, no organomegaly. Caput medusa present Rectal: Omitted Extremities: no clubbing or cyanosis. 1+ lower extremity edema bilaterally.  Skin: Multiple excoriations of the extremity skin with excoriations. Spider angiomata as well Neuro: No focal deficits. Appears to have  mild asterixis, (though difficult for him to straighten his elbows due to prior orthopedic issues)  ASSESSMENT:  #1. Advanced alcoholic hepatic cirrhosis with portal hypertension, refractory ascites, hyponatremia and possible mild encephalopathy. MELD SCORE 26. Three-month survival estimated at 20%. Discussed this with him #2. Chronic alcoholism. Abstinent since February 2017  PLAN:  #1. Continue furosemide 60 mg daily #2. Continue Aldactone 150 mg daily. #3. Keep liver clinic appointment 03/09/2016 #4. Water restriction for hyponatremia as discussed previously #5. Moisturizer for dry itchy skin #6. Schedule upper endoscopy to screen for varices. If significant, would need banding. Discussed this in detail with him. Will perform at the hospital with MAC. This has been [facility] July [facility].The nature of the procedure, as well as the risks (including but not limited to severe procedure related bleeding, chest pain, dysphagia, infection, respiratory compromise), benefits (prevent variceal bleeding), and alternatives (not appropriate candidate for beta blocker and reviewed risks of doing nothing) were carefully and thoroughly reviewed with the patient. Ample time for discussion and questions allowed. The patient understood, was satisfied, and agreed to proceed. #7. Routine office follow-up in about 3 months. Sooner if needed #8. Patient tells me that he needs to establish with new PCP. Previously Dr. Jeannetta NapElkins. I encouraged him to get a new PCP ASAP       The patient presents today for upper endoscopy to screen for esophageal varices. Given the degree of his overall liver disease, would proceed with banding if significant varices present. I discussed this again with him today this morning including but not limited to the risks of bleeding (both immediate and delayed), chest pain, dysphagia, and infection. He understands and agrees to proceed.  Wilhemina BonitoJohn N. Eda KeysPerry, Jr., M.D. [facility] of Gastroenterology

## 2016-03-31 NOTE — Telephone Encounter (Signed)
Pt calling requesting to have paracentesis, states abdomen swollen again and he is uncomfortable. Please advise.

## 2016-03-31 NOTE — Discharge Instructions (Signed)
Esophagogastroduodenoscopy, Care After °Refer to this sheet in the next few weeks. These instructions provide you with information about caring for yourself after your procedure. Your health care provider may also give you more specific instructions. Your treatment has been planned according to current medical practices, but problems sometimes occur. Call your health care provider if you have any problems or questions after your procedure. °WHAT TO EXPECT AFTER THE PROCEDURE °After your procedure, it is typical to feel: °· Soreness in your throat. °· Pain with swallowing. °· Sick to your stomach (nauseous). °· Bloated. °· Dizzy. °· Fatigued. °HOME CARE INSTRUCTIONS °· Do not eat or drink anything until the numbing medicine (local anesthetic) has worn off and your gag reflex has returned. You will know that the local anesthetic has worn off when you can swallow comfortably. °· Do not drive or operate machinery until directed by your health care provider. °· Take medicines only as directed by your health care provider. °SEEK MEDICAL CARE IF:  °· You cannot stop coughing. °· You are not urinating at all or less than usual. °SEEK IMMEDIATE MEDICAL CARE IF: °· You have difficulty swallowing. °· You cannot eat or drink. °· You have worsening throat or chest pain. °· You have dizziness or lightheadedness or you faint. °· You have nausea or vomiting. °· You have chills. °· You have a fever. °· You have severe abdominal pain. °· You have black, tarry, or bloody stools. °  °This information is not intended to replace advice given to you by your health care provider. Make sure you discuss any questions you have with your health care provider. °  °Document Released: 08/30/2012 Document Revised: 10/04/2014 Document Reviewed: 08/30/2012 °Elsevier Interactive Patient Education ©2016 [employer] Inc[employer] ° ° °Monitored Anesthesia Care °Monitored anesthesia care is an anesthesia service for a medical procedure. Anesthesia is the loss of  the ability to feel pain. It is produced by medicines called anesthetics. It may affect a small area of your body (local anesthesia), a large area of your body (regional anesthesia), or your entire body (general anesthesia). The need for monitored anesthesia care depends your procedure, your condition, and the potential need for regional or general anesthesia. It is often provided during procedures where:  °· General anesthesia may be needed if there are complications. This is because you need special care when you are under general anesthesia.   °· You will be under local or regional anesthesia. This is so that you are able to have higher levels of anesthesia if needed.   °· You will receive calming medicines (sedatives). This is especially the case if sedatives are given to put you in a semi-conscious state of relaxation (deep sedation). This is because the amount of sedative needed to produce this state can be hard to predict. Too much of a sedative can produce general anesthesia. °Monitored anesthesia care is performed by one or more health care providers who have special training in all types of anesthesia. You will need to meet with these health care providers before your procedure. During this meeting, they will ask you about your medical history. They will also give you instructions to follow. (For example, you will need to stop eating and drinking before your procedure. You may also need to stop or change medicines you are taking.) During your procedure, your health care providers will stay with you. They will:  °· Watch your condition. This includes watching your blood pressure, breathing, and level of pain.   °· Diagnose and treat problems that   occur.   °· Give medicines if they are needed. These may include calming medicines (sedatives) and anesthetics.   °· Make sure you are comfortable.   °Having monitored anesthesia care does not necessarily mean that you will be under anesthesia. It does mean that  your health care providers will be able to manage anesthesia if you need it or if it occurs. It also means that you will be able to have a different type of anesthesia than you are having if you need it. When your procedure is complete, your health care providers will continue to watch your condition. They will make sure any medicines wear off before you are allowed to go home.  °  °This information is not intended to replace advice given to you by your health care provider. Make sure you discuss any questions you have with your health care provider. °  °Document Released: 06/09/2005 Document Revised: 10/04/2014 Document Reviewed: 10/25/2012 °Elsevier Interactive Patient Education ©2016 Elsevier Inc. ° °

## 2016-03-31 NOTE — Anesthesia Preprocedure Evaluation (Addendum)
Anesthesia Evaluation  Patient identified by MRN, date of birth, ID band Patient awake    Reviewed: Allergy & Precautions, NPO status , Patient's Chart, lab work & pertinent test results  Airway Mallampati: II   Neck ROM: Full    Dental  (+) Partial Upper, Dental Advisory Given   Pulmonary     + decreased breath sounds      Cardiovascular hypertension,  Rhythm:Regular Rate:Normal     Neuro/Psych    GI/Hepatic   Endo/Other    Renal/GU      Musculoskeletal   Abdominal   Peds  Hematology   Anesthesia Other Findings   Reproductive/Obstetrics                           Anesthesia Physical Anesthesia Plan  ASA: III  Anesthesia Plan: MAC   Post-op Pain Management:    Induction: Intravenous  Airway Management Planned: Natural Airway and Nasal Cannula  Additional Equipment:   Intra-op Plan:   Post-operative Plan:   Informed Consent: I have reviewed the patients History and Physical, chart, labs and discussed the procedure including the risks, benefits and alternatives for the proposed anesthesia with the patient or authorized representative who has indicated his/her understanding and acceptance.   Dental advisory given  Plan Discussed with: CRNA and Anesthesiologist  Anesthesia Plan Comments:         Anesthesia Quick Evaluation

## 2016-03-31 NOTE — Telephone Encounter (Signed)
Actually, he called Monday. I'm doing his procedure at the hospital now (see report). Anyway, contact him tomorrow. I will let him know. Set up large volume paracentesis with albumin replacement (up to 10 L). He needs repeat basic metabolic panel this week. Thanks

## 2016-04-01 ENCOUNTER — Encounter (HOSPITAL_COMMUNITY): Payer: Self-pay | Admitting: Internal Medicine

## 2016-04-01 NOTE — Telephone Encounter (Signed)
Spoke with pt and he is aware of appt and that he needs to have labs this week. Order in epic.

## 2016-04-05 ENCOUNTER — Other Ambulatory Visit (INDEPENDENT_AMBULATORY_CARE_PROVIDER_SITE_OTHER): Payer: Medicare Other

## 2016-04-05 ENCOUNTER — Other Ambulatory Visit: Payer: Self-pay

## 2016-04-05 ENCOUNTER — Ambulatory Visit (HOSPITAL_COMMUNITY)
Admission: RE | Admit: 2016-04-05 | Discharge: 2016-04-05 | Disposition: A | Discharge status: Home / Self Care | Payer: Medicare Other | Source: Ambulatory Visit | Attending: Internal Medicine | Admitting: Internal Medicine

## 2016-04-05 DIAGNOSIS — K7031 Alcoholic cirrhosis of liver with ascites: Secondary | ICD-10-CM | POA: Diagnosis not present

## 2016-04-05 DIAGNOSIS — R188 Other ascites: Secondary | ICD-10-CM | POA: Diagnosis not present

## 2016-04-05 LAB — BASIC METABOLIC PANEL
BUN: 22 mg/dL (ref 6–23)
CHLORIDE: 98 meq/L (ref 96–112)
CO2: 21 meq/L (ref 19–32)
Calcium: 9 mg/dL (ref 8.4–10.5)
Creatinine, Ser: 1.17 mg/dL (ref 0.40–1.50)
GFR: 67.75 mL/min (ref >60.00)
GLUCOSE: 132 mg/dL — AB (ref 70–99)
POTASSIUM: 5.4 meq/L — AB (ref 3.5–5.1)
Sodium: 125 mEq/L — ABNORMAL LOW (ref 135–145)

## 2016-04-05 LAB — BODY FLUID CELL COUNT WITH DIFFERENTIAL
EOS FL: 1 %
LYMPHS FL: 51 %
MONOCYTE-MACROPHAGE-SEROUS FLUID: 42 % — AB (ref 50–90)
NEUTROPHIL FLUID: 6 % (ref 0–25)
WBC FLUID: 108 uL (ref 0–1000)

## 2016-04-05 MED ORDER — FUROSEMIDE 20 MG PO TABS: 60.0000 mg | ORAL_TABLET | Freq: Every day | ORAL | Status: DC

## 2016-04-05 MED ORDER — ALBUMIN HUMAN 25 % IV SOLN
80.0000 g | Freq: Once | INTRAVENOUS | Status: AC
  Administered 2016-04-05: 80 g via INTRAVENOUS
  Filled 2016-04-05: qty 400

## 2016-04-05 MED ORDER — LIDOCAINE HCL (PF) 1 % IJ SOLN
INTRAMUSCULAR | Status: AC
  Filled 2016-04-05: qty 10

## 2016-04-05 NOTE — Progress Notes (Signed)
CSW received consult from Meiko Brown, Ultras[facility] She reports that Patient is seen weekly for fluid removal. She reports that Patient has been informed that he cannot get on the liver transplant list until he has completed substance abuse treatment. CSW engaged with Patient at his bedside. Patient reports that his "Liver Doctor" is requesting Patient go to a program 2-3x a week for substance abuse treatment. Patient reports that he has been sober from alcohol since Christmas and has no intentions of drinking again after what has happened to his physical health. Patient reports eagerness to go to substance abuse treatment program and understands that the program that he described (Intensive Outpatient Program) is very intensive and requires commitment and readiness for change. Patient is agreeable to program. Patient reports that his physician does not want him to drive to his IOP meetings. Patient reports that his license is in good standing and that medically he is able to drive. Patient again iterated that he has not drank any alcohol since December and does not drink and drive. CSW encouraged Patient to clarify with MD as to why she does not want him to drive. CSW informed Patient of [employer] and other means of transportation if driving is not an option. Patient appreciative of CSW's assistance. No further questions or concerns reported at this time. CSW signing off. Please contact if new need(s) arise.          Lance MussAshley Gardner,MSW, LCSW [facility] Clinical Social Worker [phone]

## 2016-04-05 NOTE — Procedures (Signed)
   US guided RLQ paracentesis 10 Liter maximum per MD  Cloudy yellow fluid obtained Sent for labs per MD  Tolerated well 8 gr/L per MD- Post procedure IV Albumin= 80 Gr IV Albumin

## 2016-04-06 LAB — PATHOLOGIST SMEAR REVIEW

## 2016-04-06 NOTE — Anesthesia Postprocedure Evaluation (Signed)
Anesthesia Post Note  Patient: Devin Noble  Procedure(s) Performed: Procedure(s) (LRB): ESOPHAGOGASTRODUODENOSCOPY (EGD) (N/A)  Patient location during evaluation: Endoscopy Anesthesia Type: MAC Level of consciousness: awake, awake and alert and oriented Pain management: pain level controlled Vital Signs Assessment: post-procedure vital signs reviewed and stable Respiratory status: spontaneous breathing, nonlabored ventilation and respiratory function stable Cardiovascular status: blood pressure returned to baseline Anesthetic complications: no    Last Vitals:  Filed Vitals:   03/31/16 1005 03/31/16 1015  BP: 102/69 100/62  Pulse: 90 87  Temp:    Resp: 14 10    Last Pain: There were no vitals filed for this visit.               Danelly Hassinger COKER

## 2016-04-07 NOTE — ED Provider Notes (Signed)
Medical screening examination/treatment/procedure(s) were performed by non-physician practitioner and as supervising physician I was immediately available for consultation/collaboration.   EKG Interpretation   Date/Time:  Wednesday January 21 2016 16:20:27 EDT Ventricular Rate:  97 PR Interval:  136 QRS Duration: 102 QT Interval:  459 QTC Calculation: 583 R Axis:   3 Text Interpretation:  Sinus rhythm Low voltage, extremity leads Consider  anterior infarct Borderline repolarization abnormality Prolonged QT  interval since last tracing no significant change Confirmed by BELFI  MD,  MELANIE ([id number]) on 01/21/2016 4:50:15 PM       Devin LefevreJulie Averiana Clouatre, MD 04/07/16 1406

## 2016-04-12 ENCOUNTER — Telehealth: Payer: Self-pay

## 2016-04-12 NOTE — Telephone Encounter (Signed)
-----   Message from Chrystie NoseLinda R Addylynn Balin, RN sent at 04/05/2016  1:39 PM EDT ----- Regarding: Labs Needs labs 7/17-order in

## 2016-04-12 NOTE — Telephone Encounter (Signed)
Pt aware.

## 2016-04-13 ENCOUNTER — Other Ambulatory Visit: Payer: Self-pay

## 2016-04-13 ENCOUNTER — Other Ambulatory Visit (INDEPENDENT_AMBULATORY_CARE_PROVIDER_SITE_OTHER): Payer: Medicare Other

## 2016-04-13 DIAGNOSIS — K7031 Alcoholic cirrhosis of liver with ascites: Secondary | ICD-10-CM | POA: Diagnosis not present

## 2016-04-13 LAB — BASIC METABOLIC PANEL
BUN: 19 mg/dL (ref 6–23)
CO2: 23 mEq/L (ref 19–32)
Calcium: 9.1 mg/dL (ref 8.4–10.5)
Chloride: 96 mEq/L (ref 96–112)
Creatinine, Ser: 1.29 mg/dL (ref 0.40–1.50)
GFR: 60.53 mL/min (ref >60.00)
Glucose, Bld: 134 mg/dL — ABNORMAL HIGH (ref 70–99)
Potassium: 4.9 mEq/L (ref 3.5–5.1)
Sodium: 125 mEq/L — ABNORMAL LOW (ref 135–145)

## 2016-04-19 ENCOUNTER — Encounter (HOSPITAL_COMMUNITY): Payer: Self-pay | Admitting: Emergency Medicine

## 2016-04-19 ENCOUNTER — Other Ambulatory Visit: Payer: Self-pay

## 2016-04-19 ENCOUNTER — Encounter (HOSPITAL_COMMUNITY): Payer: Self-pay | Admitting: *Deleted

## 2016-04-19 ENCOUNTER — Emergency Department (HOSPITAL_COMMUNITY): Payer: Medicare Other

## 2016-04-19 ENCOUNTER — Observation Stay (HOSPITAL_COMMUNITY)
Admission: EM | Admit: 2016-04-19 | Discharge: 2016-04-20 | Disposition: A | Discharge status: Home / Self Care | Payer: Medicare Other | Attending: Family Medicine | Admitting: Family Medicine

## 2016-04-19 ENCOUNTER — Ambulatory Visit (INDEPENDENT_AMBULATORY_CARE_PROVIDER_SITE_OTHER)
Admission: EM | Admit: 2016-04-19 | Discharge: 2016-04-19 | Disposition: A | Discharge status: Nursing Home (Includes ICF) | Payer: Medicare Other | Source: Home / Self Care

## 2016-04-19 DIAGNOSIS — N179 Acute kidney failure, unspecified: Secondary | ICD-10-CM | POA: Diagnosis present

## 2016-04-19 DIAGNOSIS — Y93E8 Activity, other personal hygiene: Secondary | ICD-10-CM | POA: Diagnosis not present

## 2016-04-19 DIAGNOSIS — E871 Hypo-osmolality and hyponatremia: Secondary | ICD-10-CM | POA: Diagnosis not present

## 2016-04-19 DIAGNOSIS — Z9181 History of falling: Secondary | ICD-10-CM | POA: Insufficient documentation

## 2016-04-19 DIAGNOSIS — K7031 Alcoholic cirrhosis of liver with ascites: Secondary | ICD-10-CM | POA: Diagnosis present

## 2016-04-19 DIAGNOSIS — Y92002 Bathroom of unspecified non-institutional (private) residence single-family (private) house as the place of occurrence of the external cause: Secondary | ICD-10-CM | POA: Diagnosis not present

## 2016-04-19 DIAGNOSIS — W19XXXA Unspecified fall, initial encounter: Secondary | ICD-10-CM | POA: Insufficient documentation

## 2016-04-19 DIAGNOSIS — Z79899 Other long term (current) drug therapy: Secondary | ICD-10-CM | POA: Insufficient documentation

## 2016-04-19 DIAGNOSIS — Z96612 Presence of left artificial shoulder joint: Secondary | ICD-10-CM | POA: Diagnosis not present

## 2016-04-19 DIAGNOSIS — Z96611 Presence of right artificial shoulder joint: Secondary | ICD-10-CM | POA: Insufficient documentation

## 2016-04-19 DIAGNOSIS — R55 Syncope and collapse: Secondary | ICD-10-CM | POA: Diagnosis not present

## 2016-04-19 DIAGNOSIS — S50312A Abrasion of left elbow, initial encounter: Secondary | ICD-10-CM | POA: Insufficient documentation

## 2016-04-19 DIAGNOSIS — S0011XA Contusion of right eyelid and periocular area, initial encounter: Secondary | ICD-10-CM | POA: Diagnosis not present

## 2016-04-19 DIAGNOSIS — E860 Dehydration: Principal | ICD-10-CM

## 2016-04-19 DIAGNOSIS — R531 Weakness: Secondary | ICD-10-CM | POA: Insufficient documentation

## 2016-04-19 DIAGNOSIS — R296 Repeated falls: Secondary | ICD-10-CM | POA: Diagnosis not present

## 2016-04-19 DIAGNOSIS — R42 Dizziness and giddiness: Secondary | ICD-10-CM

## 2016-04-19 DIAGNOSIS — R220 Localized swelling, mass and lump, head: Secondary | ICD-10-CM | POA: Diagnosis not present

## 2016-04-19 HISTORY — DX: Hypo-osmolality and hyponatremia: E87.1

## 2016-04-19 LAB — URINALYSIS, ROUTINE W REFLEX MICROSCOPIC
Glucose, UA: NEGATIVE mg/dL
HGB URINE DIPSTICK: NEGATIVE
Ketones, ur: 15 mg/dL — AB
Leukocytes, UA: NEGATIVE
NITRITE: NEGATIVE
PH: 5 (ref 5.0–8.0)
Protein, ur: NEGATIVE mg/dL
SPECIFIC GRAVITY, URINE: 1.021 (ref 1.005–1.030)

## 2016-04-19 LAB — CBC
HEMATOCRIT: 27.2 % — AB (ref 39.0–52.0)
HEMOGLOBIN: 8.8 g/dL — AB (ref 13.0–17.0)
MCH: 24.6 pg — AB (ref 26.0–34.0)
MCHC: 32.4 g/dL (ref 30.0–36.0)
MCV: 76 fL — ABNORMAL LOW (ref 78.0–100.0)
Platelets: 289 10*3/uL (ref 150–400)
RBC: 3.58 MIL/uL — AB (ref 4.22–5.81)
RDW: 17.5 % — ABNORMAL HIGH (ref 11.5–15.5)
WBC: 8.8 10*3/uL (ref 4.0–10.5)

## 2016-04-19 LAB — BASIC METABOLIC PANEL
ANION GAP: 10 (ref 5–15)
BUN: 23 mg/dL — ABNORMAL HIGH (ref 6–20)
CALCIUM: 9.1 mg/dL (ref 8.9–10.3)
CO2: 19 mmol/L — AB (ref 22–32)
Chloride: 97 mmol/L — ABNORMAL LOW (ref 101–111)
Creatinine, Ser: 1.37 mg/dL — ABNORMAL HIGH (ref 0.61–1.24)
GFR calc non Af Amer: 55 mL/min — ABNORMAL LOW (ref >60)
GLUCOSE: 84 mg/dL (ref 65–99)
POTASSIUM: 5.1 mmol/L (ref 3.5–5.1)
Sodium: 126 mmol/L — ABNORMAL LOW (ref 135–145)

## 2016-04-19 LAB — I-STAT TROPONIN, ED: TROPONIN I, POC: 0.01 ng/mL (ref 0.00–0.08)

## 2016-04-19 NOTE — ED Triage Notes (Signed)
The patient presented to the [facility] with a complaint of dizziness when standing that started 4 days ago.

## 2016-04-19 NOTE — ED Provider Notes (Signed)
[facility]    CSN: [id number] Arrival date & time: 04/19/16  1449  First Provider Contact:  None       History   Chief Complaint Chief Complaint  Patient presents with  . Dizziness    HPI MAVRIK FREIMARK is a 60 y.o. male.   The history is provided by the patient.  Dizziness  Quality:  Lightheadedness Severity:  Moderate Onset quality:  Gradual Duration:  2 days Context: standing up   Worsened by:  Standing up Associated symptoms: syncope   Associated symptoms: no chest pain and no shortness of breath     Past Medical History:  Diagnosis Date  . Abdominal hernia   . Arthritis   . Ascites   . Birthmark of skin    "right eye" (01/30/2013)  . Cirrhosis (HCC)   . Depression   . Esophageal varices (HCC)   . GERD (gastroesophageal reflux disease)    seldom  . Hypertension    takes Lisinopril/HCTZ daily  . Orthostatic hypotension   . Pneumonia 2017  . Restless leg syndrome   . Shortness of breath dyspnea    "when I have toomuch fluid in my belly"    Patient Active Problem List   Diagnosis Date Noted  . Secondary esophageal varices without bleeding (HCC)   . Orthostatic hypotension 01/21/2016  . Fall 01/21/2016  . Hyponatremia 01/21/2016  . History of alcohol abuse - quit Jan 2017 01/21/2016  . Intravascular volume depletion 01/21/2016  . Alcoholic cirrhosis of liver with ascites (HCC) 11/10/2015  . Anemia, iron deficiency 10/24/2015  . Iron deficiency anemia due to chronic blood loss 10/02/2015  . Protein-calorie malnutrition, severe 10/01/2015  . Rectal bleeding 12/26/2014  . AP (abdominal pain) 12/26/2014  . Normocytic anemia 12/26/2014    Past Surgical History:  Procedure Laterality Date  . COLONOSCOPY  ~ 2009  . ESOPHAGOGASTRODUODENOSCOPY N/A 03/31/2016   Procedure: ESOPHAGOGASTRODUODENOSCOPY (EGD);  Surgeon: Hilarie Fredrickson, MD;  Location: [facility] ENDOSCOPY;  Service: Endoscopy;  Laterality: N/A;  . TOTAL SHOULDER ARTHROPLASTY Left 01/30/2013     Procedure: TOTAL SHOULDER ARTHROPLASTY;  Surgeon: Mable Paris, MD;  Location: [facility] OR;  Service: Orthopedics;  Laterality: Left;  . TOTAL SHOULDER ARTHROPLASTY Right 05/24/2013   Dr Ave Filter  . TOTAL SHOULDER ARTHROPLASTY Right 05/24/2013       Home Medications    Prior to Admission medications   Medication Sig Start Date End Date Taking? Authorizing Provider  furosemide (LASIX) 20 MG tablet Take 3 tablets (60 mg total) by mouth daily. 04/05/16  Yes Hilarie Fredrickson, MD  spironolactone (ALDACTONE) 100 MG tablet Take 1 tablet (100 mg total) by mouth daily. 01/30/16  Yes Leta Baptist, PA-C    Family History Family History  Problem Relation Age of Onset  . Colon cancer Neg Hx   . Colon polyps Neg Hx   . Kidney disease Neg Hx   . Diabetes Neg Hx   . Gallbladder disease Mother   . Esophageal cancer Neg Hx   . Heart disease Neg Hx     Social History Social History  Substance Use Topics  . Smoking status: Never Smoker  . Smokeless tobacco: Never Used  . Alcohol use No     Comment: No alcholol since 09/21/15     Allergies   Tramadol   Review of Systems Review of Systems  Respiratory: Negative for shortness of breath.   Cardiovascular: Positive for syncope. Negative for chest pain.  Neurological: Positive for dizziness.  Physical Exam Triage Vital Signs ED Triage Vitals  Enc Vitals Group     BP 04/19/16 1514 92/57     Pulse Rate 04/19/16 1514 103     Resp 04/19/16 1514 18     Temp 04/19/16 1514 98.2 F (36.8 C)     Temp Source 04/19/16 1514 Oral     SpO2 04/19/16 1514 100 %     Weight --      Height --      Head Circumference --      Peak Flow --      Pain Score 04/19/16 1517 8     Pain Loc --      Pain Edu? --      Excl. in GC? --    No data found.   Updated Vital Signs BP 92/57 (BP Location: Right Arm)   Pulse 103   Temp 98.2 F (36.8 C) (Oral)   Resp 18   SpO2 100%   Visual Acuity Right Eye Distance:   Left Eye Distance:    Bilateral Distance:    Right Eye Near:   Left Eye Near:    Bilateral Near:     Physical Exam  Constitutional: He is oriented to person, place, and time. No distress.  HENT:  Head: Normocephalic and atraumatic.  Eyes:  Ectropion of right lower lid  Cardiovascular: Normal rate, regular rhythm and normal heart sounds.   Pulmonary/Chest: Effort normal and breath sounds normal.  Abdominal: He exhibits distension.  Neurological: He is alert and oriented to person, place, and time.  Skin: Skin is warm and dry. He is not diaphoretic.  Jaundiced. Congenital nevus of lower right eyelid     UC Treatments / Results  Labs (all labs ordered are listed, but only abnormal results are displayed) Labs Reviewed - No data to display  EKG  EKG Interpretation None       Radiology No results found.  Procedures Procedures (including critical care time)  Medications Ordered in UC Medications - No data to display   Initial Impression / Assessment and Plan / UC Course  I have reviewed the triage vital signs and the nursing notes.  Pertinent labs & imaging results that were available during my care of the patient were reviewed by me and considered in my medical decision making (see chart for details).  Clinical Course    60 year old male cirrhosis and hypotension secondary to dehydration. Multiple falls. Needs IV fluid. We'll send to emergency department for further evaluation and management.  Final Clinical Impressions(s) / UC Diagnoses   Final diagnoses:  Dehydration  Lightheadedness  Falls, initial encounter    New Prescriptions New Prescriptions   No medications on file     Arnaldo Natal, MD 04/19/16 1537

## 2016-04-19 NOTE — ED Triage Notes (Signed)
Pt states he fell 3 times this weekend. Pt states he gets a little weak in his legs and then falls. Pt presents with dressing to left elbow. Last fall was yesterday afternoon. Pt was not seen for falls this weekend. Pt was seen at [facility] then instructed to come to ED for further eval.

## 2016-04-20 ENCOUNTER — Encounter (HOSPITAL_COMMUNITY): Payer: Self-pay | Admitting: Family Medicine

## 2016-04-20 DIAGNOSIS — K7031 Alcoholic cirrhosis of liver with ascites: Secondary | ICD-10-CM | POA: Diagnosis not present

## 2016-04-20 DIAGNOSIS — N179 Acute kidney failure, unspecified: Secondary | ICD-10-CM | POA: Diagnosis present

## 2016-04-20 DIAGNOSIS — E860 Dehydration: Secondary | ICD-10-CM

## 2016-04-20 LAB — BASIC METABOLIC PANEL
Anion gap: 6 (ref 5–15)
BUN: 25 mg/dL — AB (ref 6–20)
CHLORIDE: 97 mmol/L — AB (ref 101–111)
CO2: 20 mmol/L — ABNORMAL LOW (ref 22–32)
Calcium: 8.3 mg/dL — ABNORMAL LOW (ref 8.9–10.3)
Creatinine, Ser: 1.23 mg/dL (ref 0.61–1.24)
GFR calc Af Amer: >60 mL/min (ref >60)
GFR calc non Af Amer: >60 mL/min (ref >60)
Glucose, Bld: 99 mg/dL (ref 65–99)
POTASSIUM: 4.8 mmol/L (ref 3.5–5.1)
SODIUM: 123 mmol/L — AB (ref 135–145)

## 2016-04-20 LAB — D-DIMER, QUANTITATIVE: D-Dimer, Quant: 4.32 ug/mL-FEU — ABNORMAL HIGH (ref 0.00–0.50)

## 2016-04-20 LAB — CBG MONITORING, ED: Glucose-Capillary: 98 mg/dL (ref 65–99)

## 2016-04-20 LAB — MAGNESIUM: Magnesium: 1.9 mg/dL (ref 1.7–2.4)

## 2016-04-20 LAB — PHOSPHORUS: Phosphorus: 3.6 mg/dL (ref 2.5–4.6)

## 2016-04-20 MED ORDER — SPIRONOLACTONE 100 MG PO TABS: 100.0000 mg | ORAL_TABLET | Freq: Every day | ORAL | Status: DC

## 2016-04-20 MED ORDER — HYDROXYZINE HCL 10 MG/5ML PO SYRP
10.0000 mg | ORAL_SOLUTION | Freq: Three times a day (TID) | ORAL | Status: DC | PRN
  Filled 2016-04-20: qty 5

## 2016-04-20 MED ORDER — SPIRONOLACTONE 100 MG PO TABS
100.0000 mg | ORAL_TABLET | Freq: Every day | ORAL | 0 refills | Status: DC
Start: 2016-04-21 — End: 2016-07-30

## 2016-04-20 MED ORDER — FUROSEMIDE 20 MG PO TABS: 40.0000 mg | ORAL_TABLET | Freq: Every day | ORAL | Status: DC

## 2016-04-20 MED ORDER — HYDROXYZINE HCL 25 MG PO TABS
50.0000 mg | ORAL_TABLET | Freq: Once | ORAL | Status: AC
  Administered 2016-04-20: 50 mg via ORAL
  Filled 2016-04-20: qty 2

## 2016-04-20 MED ORDER — ENSURE ENLIVE PO LIQD: 237.0000 mL | ORAL | Status: DC

## 2016-04-20 MED ORDER — SODIUM CHLORIDE 0.9 % IV BOLUS (SEPSIS)
500.0000 mL | Freq: Once | INTRAVENOUS | Status: AC
  Administered 2016-04-20: 500 mL via INTRAVENOUS

## 2016-04-20 MED ORDER — FUROSEMIDE 40 MG PO TABS: 40.0000 mg | ORAL_TABLET | Freq: Every day | ORAL | 0 refills | Status: DC

## 2016-04-20 NOTE — Progress Notes (Signed)
Arrived from Ed. Alert and oriented. Denies any pain. Oriented to room.

## 2016-04-20 NOTE — Progress Notes (Signed)
Patient is discharged from room 5M03 at this time. Alert and in stable condition. IV site d/c'd as well as tele. Instructions read to patient with understanding verbalized. Left unit via wheelchair with all belongings at side. 

## 2016-04-20 NOTE — Progress Notes (Signed)
Initial Nutrition Assessment  INTERVENTION:  Ensure Enlive po once daily, each supplement provides 350 kcal and 20 grams of protein Provided and discussed "Suggestions for Increasing Calorie and Protein" handout from the Academy of [employer] Dietetics.    NUTRITION DIAGNOSIS:   Unintentional weight loss related to chronic illness as evidenced by percent weight loss, per patient/family report (28%).   GOAL:   Patient will meet greater than or equal to 90% of their needs   MONITOR:   PO intake, Supplement acceptance, Labs, Weight trends, Skin, I & O's  REASON FOR ASSESSMENT:   Malnutrition Screening Tool    ASSESSMENT:   59 y.o. male with medical history significant of Alcoholic Liver disease with ascites.  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  Last paracentesis 2 weeks ago.  Patient has been compliant with fluid restriction and diuretics.  Patient presents to ED with c/o intermittent weakness in BLE, light headedness, pre-syncope.  Symptoms ongoing for past 3 days, worse when standing up from seated position.   Pt states that he is feeling better and hopes to be discharged later today. He states that his appetite has been good and he is eating well. He reports eating well PTA with 3 meals daily and one bottle of Ensure daily. Pt reports that about one year ago, he was weighing 256 lbs. Weight history shows that patient has lost 28% of his body weight in the past 3-4 months. Pt relates weight loss to cirrhosis; pt feels he has been eating normally and following a low sodium diet. Pt states that he can't tolerate more than one bottle of Ensure per day. RD recommended snacking in between meals and increasing calorie and protein intake. Provided and discussed "Suggestions for Increasing Calorie and Protein" handout from the Academy of Nutrition and Dietetics. Limited nutrition-focused physical exam due to pt  receiving phone call.   Labs: low sodium, low chloride, low calcium, low hemoglobin  Diet Order:  Diet 2 gram sodium Room service appropriate? Yes; Fluid consistency: Thin; Fluid restriction: 2000 mL Fluid  Skin:  Reviewed, no issues  Last BM:  7/24  Height:   Ht Readings from Last 1 Encounters:  04/20/16 5\' 11"  (1.803 m)    Weight:   Wt Readings from Last 1 Encounters:  04/20/16 174 lb 6.4 oz (79.1 kg)    Ideal Body Weight:  78.2 kg  BMI:  Body mass index is 24.32 kg/m.  Estimated Nutritional Needs:   Kcal:  2300-2500  Protein:  90-100 grams  Fluid:  per MD  EDUCATION NEEDS:   Education needs addressed  Dorothea Ogle RD, LDN Inpatient Clinical Dietitian Pager: [phone] After Hours Pager: [phone]

## 2016-04-20 NOTE — Care Management Note (Signed)
Case Management Note  Patient Details  Name: Devin Noble MRN: [id number] Date of Birth: 1956-03-20  Subjective/Objective:   Pt admitted with dehydration. He is from home alone. Pt with ascites that undergoes frequent paracentesis.                  Action/Plan: Continued medical work up. CM following for discharge needs.   Expected Discharge Date:                  Expected Discharge Plan:  Home/Self Care  In-House Referral:     Discharge planning Services     Post Acute Care Choice:    Choice offered to:     DME Arranged:    DME Agency:     HH Arranged:    HH Agency:     Status of Service:  In process, will continue to follow  If discussed at Long Length of Stay Meetings, dates discussed:    Additional Comments:  Kermit Balo, RN 04/20/2016, 10:51 AM

## 2016-04-20 NOTE — Discharge Instructions (Signed)
Acute Kidney Injury °Acute kidney injury is any condition in which there is sudden (acute) damage to the kidneys. Acute kidney injury was previously known as acute kidney failure or acute renal failure. The kidneys are two organs that lie on either side of the spine between the middle of the back and the front of the abdomen. The kidneys: °· Remove wastes and extra water from the blood.   °· Produce important hormones. These help keep bones strong, regulate blood pressure, and help create red blood cells.   °· Balance the fluids and chemicals in the blood and tissues. °A small amount of kidney damage may not cause problems, but a large amount of damage may make it difficult or impossible for the kidneys to work the way they should. Acute kidney injury may develop into long-lasting (chronic) kidney disease. It may also develop into a life-threatening disease called end-stage kidney disease. Acute kidney injury can get worse very quickly, so it should be treated right away. Early treatment may prevent other kidney diseases from developing. °CAUSES  °· A problem with blood flow to the kidneys. This may be caused by:   °¨ Blood loss.   °¨ Heart disease.   °¨ Severe burns.   °¨ Liver disease. °· Direct damage to the kidneys. This may be caused by: °¨ Some medicines.   °¨ A kidney infection.   °¨ Poisoning or consuming toxic substances.   °¨ A surgical wound.   °¨ A blow to the kidney area.   °· A problem with urine flow. This may be caused by:   °¨ Cancer.   °¨ Kidney stones.   °¨ An enlarged prostate. °SIGNS AND SYMPTOMS  °· Swelling (edema) of the legs, ankles, or feet.   °· Tiredness (lethargy).   °· Nausea or vomiting.   °· Confusion.   °· Problems with urination, such as:   °¨ Painful or burning feeling during urination.   °¨ Decreased urine production.   °¨ Frequent accidents in children who are potty trained.   °¨ Bloody urine.   °· Muscle twitches and cramps.   °· Shortness of breath.   °· Seizures.   °· Chest  pain or pressure. °Sometimes, no symptoms are present.  °DIAGNOSIS °Acute kidney injury may be detected and diagnosed by tests, including blood, urine, imaging, or kidney biopsy tests.  °TREATMENT °Treatment of acute kidney injury varies depending on the cause and severity of the kidney damage. In mild cases, no treatment may be needed. The kidneys may heal on their own. If acute kidney injury is more severe, your health care provider will treat the cause of the kidney damage, help the kidneys heal, and prevent complications from occurring. Severe cases may require a procedure to remove toxic wastes from the body (dialysis) or surgery to repair kidney damage. Surgery may involve:  °· Repair of a torn kidney.   °· Removal of an obstruction. °HOME CARE INSTRUCTIONS °· Follow your prescribed diet. °· Take medicines only as directed by your health care provider.  °· Do not take any new medicines (prescription, over-the-counter, or nutritional supplements) unless approved by your health care provider. Many medicines can worsen your kidney damage or may need to have the dose adjusted.   °· Keep all follow-up visits as directed by your health care provider. This is important. °· Observe your condition to make sure you are healing as expected. °SEEK IMMEDIATE MEDICAL CARE IF: °· You are feeling ill or have severe pain in the back or side.   °· Your symptoms return or you have new symptoms. °· You have any symptoms of end-stage kidney disease. These include:   °¨ Persistent itchiness.   °¨   Loss of appetite.   Headaches.   Abnormally dark or light skin.  Numbness in the hands or feet.   Easy bruising.   Frequent hiccups.   Menstruation stops.   You have a fever.  You have increased urine production.  You have pain or bleeding when urinating. MAKE SURE YOU:   Understand these instructions.  Will watch your condition.  Will get help right away if you are not doing well or get worse.   This  information is not intended to replace advice given to you by your health care provider. Make sure you discuss any questions you have with your health care provider.   Document Released: 03/29/2011 Document Revised: 10/04/2014 Document Reviewed: 05/12/2012 Elsevier Interactive Patient Education 2016 [employer] Inc[employer]  Ascites Ascites is a collection of excess fluid in the abdomen. Ascites can range from mild to severe. It can get worse without treatment. CAUSES Possible causes include:  Cirrhosis. This is the most common cause of ascites.  Infection or inflammation in the abdomen.  Cancer in the abdomen.  Heart failure.  Kidney disease.  Inflammation of the pancreas.  Clots in the veins of the liver. SIGNS AND SYMPTOMS Signs and symptoms may include:  A feeling of fullness in your abdomen. This is common.  An increase in the size of your abdomen or your waist.  Swelling in your legs.  Swelling of the scrotum in men.  Difficulty breathing.  Abdominal pain.  Sudden weight gain. If the condition is mild, you may not have symptoms. DIAGNOSIS To make a diagnosis, your health care provider will:  Ask about your medical history.  Perform a physical exam.  Order imaging tests, such as an ultrasound or CT scan of your abdomen. TREATMENT Treatment depends on the cause of the ascites. It may include:  Taking a pill to make you urinate. This is called a water pill (diuretic pill).  Strictly reducing your salt (sodium) intake. Salt can cause extra fluid to be kept in the body, and this makes ascites worse.  Having a procedure to remove fluid from your abdomen (paracentesis).  Having a procedure to transfer fluid from your abdomen into a vein.  Having a procedure that connects two of the major veins within your liver and relieves pressure on your liver (TIPS procedure). Ascites may go away or improve with treatment of the condition that caused it.  HOME CARE  INSTRUCTIONS  Keep track of your weight. To do this, weigh yourself at the same time every day and record your weight.  Keep track of how much you drink and any changes in the amount you urinate.  Follow any instructions that your health care provider gives you about how much to drink.  Try not to eat salty (high-sodium) foods.  Take medicines only as directed by your health care provider.  Keep all follow-up visits as directed by your health care provider. This is important.  Report any changes in your health to your health care provider, especially if you develop new symptoms or your symptoms get worse. SEEK MEDICAL CARE IF:  Your gain more than 3 pounds in 3 days.  Your abdominal size or your waist size increases.  You have new swelling in your legs.  The swelling in your legs gets worse. SEEK IMMEDIATE MEDICAL CARE IF:  You develop a fever.  You develop confusion.  You develop new or worsening difficulty breathing.  You develop new or worsening abdominal pain.  You develop new or worsening swelling  in the scrotum (in men).   This information is not intended to replace advice given to you by your health care provider. Make sure you discuss any questions you have with your health care provider.   Document Released: 09/13/2005 Document Revised: 10/04/2014 Document Reviewed: 04/12/2014 Elsevier Interactive Patient Education 2016 [employer] Inc[employer]   Dehydration, Adult Dehydration is a condition in which you do not have enough fluid or water in your body. It happens when you take in less fluid than you lose. Vital organs such as the kidneys, brain, and heart cannot function without a proper amount of fluids. Any loss of fluids from the body can cause dehydration.  Dehydration can range from mild to severe. This condition should be treated right away to help prevent it from becoming severe. CAUSES  This condition may be caused by:  Vomiting.  Diarrhea.  Excessive  sweating, such as when exercising in hot or humid weather.  Not drinking enough fluid during strenuous exercise or during an illness.  Excessive urine output.  Fever.  Certain medicines. RISK FACTORS This condition is more likely to develop in:  People who are taking certain medicines that cause the body to lose excess fluid (diuretics).   People who have a chronic illness, such as diabetes, that may increase urination.  Older adults.   People who live at high altitudes.   People who participate in endurance sports.  SYMPTOMS  Mild Dehydration  Thirst.  Dry lips.  Slightly dry mouth.  Dry, warm skin. Moderate Dehydration  Very dry mouth.   Muscle cramps.   Dark urine and decreased urine production.   Decreased tear production.   Headache.   Light-headedness, especially when you stand up from a sitting position.  Severe Dehydration  Changes in skin.   Cold and clammy skin.   Skin does not spring back quickly when lightly pinched and released.   Changes in body fluids.   Extreme thirst.   No tears.   Not able to sweat when body temperature is high, such as in hot weather.   Minimal urine production.   Changes in vital signs.   Rapid, weak pulse (more than 100 beats per minute when you are sitting still).   Rapid breathing.   Low blood pressure.   Other changes.   Sunken eyes.   Cold hands and feet.   Confusion.  Lethargy and difficulty being awakened.  Fainting (syncope).   Short-term weight loss.   Unconsciousness. DIAGNOSIS  This condition may be diagnosed based on your symptoms. You may also have tests to determine how severe your dehydration is. These tests may include:   Urine tests.   Blood tests.  TREATMENT  Treatment for this condition depends on the severity. Mild or moderate dehydration can often be treated at home. Treatment should be started right away. Do not wait until dehydration  becomes severe. Severe dehydration needs to be treated at the hospital. Treatment for Mild Dehydration  Drinking plenty of water to replace the fluid you have lost.   Replacing minerals in your blood (electrolytes) that you may have lost.  Treatment for Moderate Dehydration  Consuming oral rehydration solution (ORS). Treatment for Severe Dehydration  Receiving fluid through an IV tube.   Receiving electrolyte solution through a feeding tube that is passed through your nose and into your stomach (nasogastric tube or NG tube).  Correcting any abnormalities in electrolytes. HOME CARE INSTRUCTIONS   Drink enough fluid to keep your urine clear or pale yellow.  Drink water or fluid slowly by taking small sips. You can also try sucking on ice cubes.  Have food or beverages that contain electrolytes. Examples include bananas and sports drinks.  Take over-the-counter and prescription medicines only as told by your health care provider.   Prepare ORS according to the manufacturer's instructions. Take sips of ORS every 5 minutes until your urine returns to normal.  If you have vomiting or diarrhea, continue to try to drink water, ORS, or both.   If you have diarrhea, avoid:   Beverages that contain caffeine.   Fruit juice.   Milk.   Carbonated soft drinks.  Do not take salt tablets. This can lead to the condition of having too much sodium in your body (hypernatremia).  SEEK MEDICAL CARE IF:  You cannot eat or drink without vomiting.  You have had moderate diarrhea during a period of more than 24 hours.  You have a fever. SEEK IMMEDIATE MEDICAL CARE IF:   You have extreme thirst.  You have severe diarrhea.  You have not urinated in 6-8 hours, or you have urinated only a small amount of very dark urine.  You have shriveled skin.  You are dizzy, confused, or both.   This information is not intended to replace advice given to you by your health care  provider. Make sure you discuss any questions you have with your health care provider.   Document Released: 09/13/2005 Document Revised: 06/04/2015 Document Reviewed: 01/29/2015 Elsevier Interactive Patient Education [employer]

## 2016-04-20 NOTE — Discharge Summary (Addendum)
Physician Discharge Summary  Devin Noble IRJ:[id number] DOB: 01/21/1956 DOA: 04/19/2016  PCP: No PCP Per Patient  Admit date: 04/19/2016 Discharge date: 04/20/2016  Time spent: 35 minutes  Recommendations for Outpatient Follow-up:    Discharge Diagnoses:  Principal Problem:   Dehydration Active Problems:   Alcoholic cirrhosis of liver with ascites (HCC)   AKI (acute kidney injury) (HCC)   Discharge Condition: stable  Diet recommendation: low salt diet  Filed Weights   04/20/16 0355  Weight: 79.1 kg (174 lb 6.4 oz)    History of present illness:  Devin Noble is a 60 y.o. male with medical history significant of Alcoholic Liver disease with ascites.  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  Last paracentesis 2 weeks ago.  Patient has been compliant with fluid restriction and diuretics.  Patient presents to ED with c/o intermittent weakness in BLE, light headedness, pre-syncope.  Symptoms ongoing for past 3 days, worse when standing up from seated position.  Nothing makes better or worse.   Hospital Course:  Dehydration addressed by IVF. Lasix dose decr. Orthostatic when admitted. Able o ambulate w/o assist prior to d/c.  Procedures:  n/a (i.e. Studies not automatically included, echos, thoracentesis, etc; not x-rays)  Consultations:  n/a  Discharge Exam: Vitals:   04/20/16 1050 04/20/16 1522  BP: (!) 99/59 110/62  Pulse: 94 88  Resp: 18 18  Temp: 98.3 F (36.8 C) 98 F (36.7 C)     General:  No diaphoresis, anxious, no acute distress  Cardiovascular: Regular rate and rhythm no murmurs rubs or gallops  Respiratory: Clear to auscultation bilaterally no more breathing  Abdomen: Nondistended bowel sounds normal nontender palpation  Musculoskeletal: Moving all extremities, no deformity, 5 out of 5 strength    Discharge Instructions    Discharge Medication List as of 04/20/2016   5:22 PM    CONTINUE these medications which have CHANGED   Details  furosemide (LASIX) 40 MG tablet Take 1 tablet (40 mg total) by mouth daily., Starting Wed 04/21/2016, Normal    spironolactone (ALDACTONE) 100 MG tablet Take 1 tablet (100 mg total) by mouth daily., Starting Wed 04/21/2016, Normal       Allergies  Allergen Reactions  . Tramadol Hives and Itching      The results of significant diagnostics from this hospitalization (including imaging, microbiology, ancillary and laboratory) are listed below for reference.    Significant Diagnostic Studies: Ct Head Wo Contrast  Result Date: 04/19/2016 CLINICAL DATA:  Fall with injury/ laceration to the right side of the head. No loss of consciousness. Multiple recent falls. Initial encounter. EXAM: CT HEAD WITHOUT CONTRAST TECHNIQUE: Contiguous axial images were obtained from the base of the skull through the vertex without intravenous contrast. COMPARISON:  None. FINDINGS: There is no evidence of acute cortical infarct, intracranial hemorrhage, mass, midline shift, or extra-axial fluid collection. Ventricles and sulci are within normal limits for age. Subcortical and periventricular white matter hypodensities are nonspecific but compatible with mild chronic small vessel ischemic disease. Orbits are unremarkable. There is mild right parieto-occipital scalp swelling. The visualized paranasal sinuses and mastoid air cells are clear. No skull fracture is identified. Calcified atherosclerosis is noted at the skull base. IMPRESSION: 1. No evidence of acute intracranial abnormality. 2. Mild right-sided scalp swelling. 3. Mild chronic small vessel ischemic disease. Electronically Signed   By: Sebastian Ache M.D.   On: 04/19/2016 18:02  US Paracentesis  Result Date: 04/05/2016  INDICATION: Recurrent ascites EXAM: ULTRASOUND-GUIDED PARACENTESIS COMPARISON:  Previous paracentesis MEDICATIONS: 10 cc 1% lidocaine COMPLICATIONS: None immediate. TECHNIQUE:  Informed written consent was obtained from the patient after a discussion of the risks, benefits and alternatives to treatment. A timeout was performed prior to the initiation of the procedure. Initial ultrasound scanning demonstrates a large amount of ascites within the right lower abdominal quadrant. The right lower abdomen was prepped and draped in the usual sterile fashion. 1% lidocaine with epinephrine was used for local anesthesia. Under direct ultrasound guidance, a 19 gauge, 7-cm, Yueh catheter was introduced. An ultrasound image was saved for documentation purposed. The paracentesis was performed. The catheter was removed and a dressing was applied. The patient tolerated the procedure well without immediate post procedural complication. FINDINGS: A total of approximately 10 liters of cloudy yellow fluid was removed. Samples were sent to the laboratory as requested by the clinical team. IMPRESSION: Successful ultrasound-guided paracentesis yielding 10 liters of peritoneal fluid. Maximum per MD. Post procedure IV Albumin; 8gr/L= 80 gr IV Albumin post Read by: Robet Leu [facility] Electronically Signed   By: Irish Lack M.D.   On: 04/05/2016 10:58    Microbiology: No results found for this or any previous visit (from the past 240 hour(s)).   Labs: Basic Metabolic Panel:  Recent Labs Lab 04/19/16 1639 04/20/16 1049  NA 126* 123*  K 5.1 4.8  CL 97* 97*  CO2 19* 20*  GLUCOSE 84 99  BUN 23* 25*  CREATININE 1.37* 1.23  CALCIUM 9.1 8.3*  MG  --  1.9  PHOS  --  3.6   Liver Function Tests: No results for input(s): AST, ALT, ALKPHOS, BILITOT, PROT, ALBUMIN in the last 168 hours. No results for input(s): LIPASE, AMYLASE in the last 168 hours. No results for input(s): AMMONIA in the last 168 hours. CBC:  Recent Labs Lab 04/19/16 1639  WBC 8.8  HGB 8.8*  HCT 27.2*  MCV 76.0*  PLT 289   Cardiac Enzymes: No results for input(s): CKTOTAL, CKMB, CKMBINDEX, TROPONINI in the last 168  hours. BNP: BNP (last 3 results) No results for input(s): BNP in the last 8760 hours.  ProBNP (last 3 results) No results for input(s): PROBNP in the last 8760 hours.  CBG:  Recent Labs Lab 04/20/16 0023  GLUCAP 98       Signed:  Haydee Salter MD  FACP  [facility] 04/20/2016, 4:57 PM

## 2016-04-20 NOTE — ED Notes (Signed)
Pt states dizziness upon standing, and then he has syncopal episodes.

## 2016-04-20 NOTE — Care Management Obs Status (Signed)
MEDICARE OBSERVATION STATUS NOTIFICATION   Patient Details  Name: Devin Noble MRN: [id number] Date of Birth: Feb 02, 1956   Medicare Observation Status Notification Given:  Yes    Kermit Balo, RN 04/20/2016, 12:15 PM

## 2016-04-20 NOTE — Care Management Note (Signed)
Case Management Note  Patient Details  Name: JURON PETITTE MRN: [id number] Date of Birth: 1955/10/23  Subjective/Objective:                    Action/Plan: Pt discharging home with self care. No further needs per CM.   Expected Discharge Date:                  Expected Discharge Plan:  Home/Self Care  In-House Referral:     Discharge planning Services     Post Acute Care Choice:    Choice offered to:     DME Arranged:    DME Agency:     HH Arranged:    HH Agency:     Status of Service:  Completed, signed off  If discussed at [employer] of Stay Meetings, dates discussed:    Additional Comments:  Kermit Balo, RN 04/20/2016, 5:54 PM

## 2016-04-20 NOTE — ED Provider Notes (Signed)
[facility]-EMERGENCY DEPT Provider Note   CSN: [id number] Arrival date & time: 04/19/16  1609  First Provider Contact:  First MD Initiated Contact with Patient 04/20/16 0041     By signing my name below, I, Octavia Heir, attest that this documentation has been prepared under the direction and in the presence of Derwood Kaplan, MD.  Electronically Signed: Octavia Heir, ED Scribe. 04/20/16. 1:16 AM.    History   Chief Complaint Chief Complaint  Patient presents with  . Fall  . Weakness    ek   The history is provided by the patient. No language interpreter was used.   HPI Comments: Devin Noble is a 60 y.o. male who has a PMHx of ascites, cirrhosis of the liver, GERD, HTN, pneumonia and SOB presents to the Emergency Department complaining of sudden onset, gradual worsening, intermittent weakness onset about 3 days ago. He reports associated mild light-headedness and severe itching from his neck that radiates down his body. Pt says yesterday he was ambulating to the restroom when he began to feel mildly light-headed upon standing, weak in his legs, and fell in the bathroom. He notes hitting the right side of his face and left elbow upon impact causing bruising to his right eye and an abrasion on his left elbow. Pt believes that he may have blacked out mildly but he is unsure. Pt states that he begins to feel light-headed upon standing but is able to feel stable again if he takes his time. He reports that he has fallen about 4 times this weekend due to recurrent weakness in his legs. Pt currently takes aldactone and lasix for his cirrhosis and notes he had a paracentesis done two weeks ago. He denies headache, visual disturbances, diarrhea, fever, cough, abdominal pain, hematuria, or diaphoresis. Pt does not have a PCP.  Past Medical History:  Diagnosis Date  . Abdominal hernia   . Arthritis   . Ascites   . Birthmark of skin    "right eye" (01/30/2013)  . Cirrhosis (HCC)   . Depression    . Esophageal varices (HCC)   . GERD (gastroesophageal reflux disease)    seldom  . Hypertension    takes Lisinopril/HCTZ daily  . Orthostatic hypotension   . Pneumonia 2017  . Restless leg syndrome   . Shortness of breath dyspnea    "when I have toomuch fluid in my belly"    Patient Active Problem List   Diagnosis Date Noted  . Dehydration 04/20/2016  . Secondary esophageal varices without bleeding (HCC)   . Orthostatic hypotension 01/21/2016  . Fall 01/21/2016  . Hyponatremia 01/21/2016  . History of alcohol abuse - quit Jan 2017 01/21/2016  . Intravascular volume depletion 01/21/2016  . Alcoholic cirrhosis of liver with ascites (HCC) 11/10/2015  . Anemia, iron deficiency 10/24/2015  . Iron deficiency anemia due to chronic blood loss 10/02/2015  . Protein-calorie malnutrition, severe 10/01/2015  . Rectal bleeding 12/26/2014  . AP (abdominal pain) 12/26/2014  . Normocytic anemia 12/26/2014    Past Surgical History:  Procedure Laterality Date  . COLONOSCOPY  ~ 2009  . ESOPHAGOGASTRODUODENOSCOPY N/A 03/31/2016   Procedure: ESOPHAGOGASTRODUODENOSCOPY (EGD);  Surgeon: Hilarie Fredrickson, MD;  Location: [facility] ENDOSCOPY;  Service: Endoscopy;  Laterality: N/A;  . TOTAL SHOULDER ARTHROPLASTY Left 01/30/2013   Procedure: TOTAL SHOULDER ARTHROPLASTY;  Surgeon: Mable Paris, MD;  Location: [facility] OR;  Service: Orthopedics;  Laterality: Left;  . TOTAL SHOULDER ARTHROPLASTY Right 05/24/2013   Dr Ave Filter  . TOTAL SHOULDER  ARTHROPLASTY Right 05/24/2013       Home Medications    Prior to Admission medications   Medication Sig Start Date End Date Taking? Authorizing Provider  furosemide (LASIX) 20 MG tablet Take 3 tablets (60 mg total) by mouth daily. 04/05/16  Yes Hilarie Fredrickson, MD  spironolactone (ALDACTONE) 100 MG tablet Take 1 tablet (100 mg total) by mouth daily. Patient taking differently: Take 150 mg by mouth daily.  01/30/16  Yes Leta Baptist, PA-C    Family History Family  History  Problem Relation Age of Onset  . Gallbladder disease Mother   . Colon cancer Neg Hx   . Colon polyps Neg Hx   . Kidney disease Neg Hx   . Diabetes Neg Hx   . Esophageal cancer Neg Hx   . Heart disease Neg Hx     Social History Social History  Substance Use Topics  . Smoking status: Never Smoker  . Smokeless tobacco: Never Used  . Alcohol use No     Comment: No alcholol since 09/21/15     Allergies   Tramadol   Review of Systems Review of Systems 10 Systems reviewed and are negative for acute change except as noted in the HPI.  Physical Exam Updated Vital Signs BP 107/69 (BP Location: Right Arm)   Pulse 95   Temp 98.2 F (36.8 C) (Oral)   Resp 16   Ht  (1.803 m)   Wt 174 lb 6.4 oz (79.1 kg)   SpO2 100%   BMI 24.32 kg/m   Physical Exam  Constitutional: He is oriented to person, place, and time. He appears well-developed and well-nourished.  HENT:  Head: Normocephalic and atraumatic.  Eyes: EOM are normal.  Neck: Normal range of motion. No JVD present.  Positive distended neck veins No JVD  Cardiovascular: Normal rate, regular rhythm, normal heart sounds and intact distal pulses.   Pulmonary/Chest: Effort normal and breath sounds normal. No respiratory distress.  Lungs are clear to ausculation  Abdominal: Soft. He exhibits no distension. There is no tenderness.  Positive mild ascites on abdominal exam  Musculoskeletal: Normal range of motion.  Neurological: He is alert and oriented to person, place, and time.  Skin: Skin is warm and dry.  Psychiatric: He has a normal mood and affect. Judgment normal.  Nursing note and vitals reviewed.    ED Treatments / Results  DIAGNOSTIC STUDIES: Oxygen Saturation is 100% on RA, normal by my interpretation.  COORDINATION OF CARE:  1:11 AM Discussed treatment plan which includes IV fluids with pt at bedside and pt agreed to plan. Pt will be admitted to the hospital.  Labs (all labs ordered are listed,  but only abnormal results are displayed) Labs Reviewed  BASIC METABOLIC PANEL - Abnormal; Notable for the following:       Result Value   Sodium 126 (*)    Chloride 97 (*)    CO2 19 (*)    BUN 23 (*)    Creatinine, Ser 1.37 (*)    GFR calc non Af Amer 55 (*)    All other components within normal limits  CBC - Abnormal; Notable for the following:    RBC 3.58 (*)    Hemoglobin 8.8 (*)    HCT 27.2 (*)    MCV 76.0 (*)    MCH 24.6 (*)    RDW 17.5 (*)    All other components within normal limits  URINALYSIS, ROUTINE W REFLEX MICROSCOPIC (NOT AT [facility]) - Abnormal;  Notable for the following:    Color, Urine AMBER (*)    APPearance CLOUDY (*)    Bilirubin Urine SMALL (*)    Ketones, ur 15 (*)    All other components within normal limits  D-DIMER, QUANTITATIVE (NOT AT [facility]) - Abnormal; Notable for the following:    D-Dimer, Quant 4.32 (*)    All other components within normal limits  CBG MONITORING, ED  I-STAT TROPOININ, ED    EKG  EKG Interpretation  Date/Time:  Monday April 19 2016 16:34:44 EDT Ventricular Rate:  93 PR Interval:  132 QRS Duration: 84 QT Interval:  368 QTC Calculation: 457 R Axis:   -10 Text Interpretation:  Normal sinus rhythm Possible Anterior infarct , age undetermined Abnormal ECG Poor R wave progression Nonspecific ST and T wave abnormality Confirmed by Rhunette Croft, MD, Janey Genta [phone]) on 04/20/2016 1:11:00 AM       Radiology Ct Head Wo Contrast  Result Date: 04/19/2016 CLINICAL DATA:  Fall with injury/ laceration to the right side of the head. No loss of consciousness. Multiple recent falls. Initial encounter. EXAM: CT HEAD WITHOUT CONTRAST TECHNIQUE: Contiguous axial images were obtained from the base of the skull through the vertex without intravenous contrast. COMPARISON:  None. FINDINGS: There is no evidence of acute cortical infarct, intracranial hemorrhage, mass, midline shift, or extra-axial fluid collection. Ventricles and sulci are within normal limits  for age. Subcortical and periventricular white matter hypodensities are nonspecific but compatible with mild chronic small vessel ischemic disease. Orbits are unremarkable. There is mild right parieto-occipital scalp swelling. The visualized paranasal sinuses and mastoid air cells are clear. No skull fracture is identified. Calcified atherosclerosis is noted at the skull base. IMPRESSION: 1. No evidence of acute intracranial abnormality. 2. Mild right-sided scalp swelling. 3. Mild chronic small vessel ischemic disease. Electronically Signed   By: Sebastian Ache M.D.   On: 04/19/2016 18:02   Procedures Procedures (including critical care time)  Medications Ordered in ED Medications  furosemide (LASIX) tablet 40 mg (not administered)  spironolactone (ALDACTONE) tablet 100 mg (not administered)  hydrOXYzine (ATARAX/VISTARIL) tablet 50 mg (50 mg Oral Given 04/20/16 0123)  sodium chloride 0.9 % bolus 500 mL (500 mLs Intravenous Given 04/20/16 0455)     Initial Impression / Assessment and Plan / ED Course  I have reviewed the triage vital signs and the nursing notes.  Pertinent labs & imaging results that were available during my care of the patient were reviewed by me and considered in my medical decision making (see chart for details).  Clinical Course    Orthostatics +, with BP dropping to 80 SBP.  Final Clinical Impressions(s) / ED Diagnoses   Final diagnoses:  Hyponatremia  AKI (acute kidney injury) (HCC)  Syncope and collapse  Alcoholic cirrhosis of liver with ascites (HCC)  Dehydration   I personally performed the services described in this documentation, which was scribed in my presence. The recorded information has been reviewed and is accurate.   Pt comes in with weakness and dizziness. Hx of liver cirrhosis. Pt on fluid restriction and lasix. His Cr has almost doubles since May. Pt likely dehydrated. We will get orthostatics. We will admit for hydration and to  optimizations.  New Prescriptions Current Discharge Medication List       Derwood Kaplan, MD 04/20/16 [phone]

## 2016-04-20 NOTE — H&P (Signed)
History and Physical    Devin Noble ZDG:[id number] DOB: [birth year] DOA: 04/19/2016   PCP: No PCP Per Patient Chief Complaint:  Chief Complaint  Patient presents with  . Fall  . Weakness    HPI: Devin Noble is a 60 y.o. male with medical history significant of Alcoholic Liver disease with ascites.  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  Last paracentesis 2 weeks ago.  Patient has been compliant with fluid restriction and diuretics.  Patient presents to ED with c/o intermittent weakness in BLE, light headedness, pre-syncope.  Symptoms ongoing for past 3 days, worse when standing up from seated position.  Nothing makes better or worse.  ED Course: Orthostatics positive with drop in SBP from 109 to 83 (lying to standing).  Review of Systems: As per HPI otherwise 10 point review of systems negative.    Past Medical History:  Diagnosis Date  . Abdominal hernia   . Arthritis   . Ascites   . Birthmark of skin    "right eye" (01/30/2013)  . Cirrhosis (HCC)   . Depression   . Esophageal varices (HCC)   . GERD (gastroesophageal reflux disease)    seldom  . Hypertension    takes Lisinopril/HCTZ daily  . Orthostatic hypotension   . Pneumonia 2017  . Restless leg syndrome   . Shortness of breath dyspnea    "when I have toomuch fluid in my belly"    Past Surgical History:  Procedure Laterality Date  . COLONOSCOPY  ~ 2009  . ESOPHAGOGASTRODUODENOSCOPY N/A 03/31/2016   Procedure: ESOPHAGOGASTRODUODENOSCOPY (EGD);  Surgeon: Hilarie Fredrickson, MD;  Location: [facility] ENDOSCOPY;  Service: Endoscopy;  Laterality: N/A;  . TOTAL SHOULDER ARTHROPLASTY Left 01/30/2013   Procedure: TOTAL SHOULDER ARTHROPLASTY;  Surgeon: Mable Paris, MD;  Location: [facility] OR;  Service: Orthopedics;  Laterality: Left;  . TOTAL SHOULDER ARTHROPLASTY Right 05/24/2013   Dr Ave Filter  . TOTAL SHOULDER ARTHROPLASTY Right 05/24/2013     reports  that he has never smoked. He has never used smokeless tobacco. He reports that he does not drink alcohol or use drugs.  Allergies  Allergen Reactions  . Tramadol Hives and Itching    Family History  Problem Relation Age of Onset  . Gallbladder disease Mother   . Colon cancer Neg Hx   . Colon polyps Neg Hx   . Kidney disease Neg Hx   . Diabetes Neg Hx   . Esophageal cancer Neg Hx   . Heart disease Neg Hx       Prior to Admission medications   Medication Sig Start Date End Date Taking? Authorizing Provider  furosemide (LASIX) 20 MG tablet Take 3 tablets (60 mg total) by mouth daily. 04/05/16  Yes Hilarie Fredrickson, MD  spironolactone (ALDACTONE) 100 MG tablet Take 1 tablet (100 mg total) by mouth daily. Patient taking differently: Take 150 mg by mouth daily.  01/30/16  Yes Leta Baptist, PA-C    Physical Exam: Vitals:   04/20/16 0100 04/20/16 0115 04/20/16 0130 04/20/16 0230  BP: 110/73 103/76 94/72 108/80  Pulse: 99 90 89 87  Resp: 25 12 13 13   Temp:      TempSrc:      SpO2: 100% 100% 100% 100%      Constitutional: NAD, calm, comfortable Eyes: PERRL, lids and conjunctivae normal ENMT: Mucous membranes are moist. Posterior pharynx clear of any exudate or lesions.Normal dentition.  Neck: normal, supple, no  masses, no thyromegaly Respiratory: clear to auscultation bilaterally, no wheezing, no crackles. Normal respiratory effort. No accessory muscle use.  Cardiovascular: Regular rate and rhythm, no murmurs / rubs / gallops. No extremity edema. 2+ pedal pulses. No carotid bruits.  Abdomen: no tenderness, no masses palpated. No hepatosplenomegaly. Bowel sounds positive.  Musculoskeletal: no clubbing / cyanosis. No joint deformity upper and lower extremities. Good ROM, no contractures. Normal muscle tone.  Skin: no rashes, lesions, ulcers. No induration Neurologic: CN 2-12 grossly intact. Sensation intact, DTR normal. Strength 5/5 in all 4.  Psychiatric: Normal judgment and insight.  Alert and oriented x 3. Normal mood.    Labs on Admission: I have personally reviewed following labs and imaging studies  CBC:  Recent Labs Lab 04/19/16 1639  WBC 8.8  HGB 8.8*  HCT 27.2*  MCV 76.0*  PLT 289   Basic Metabolic Panel:  Recent Labs Lab 04/13/16 1056 04/19/16 1639  NA 125* 126*  K 4.9 5.1  CL 96 97*  CO2 23 19*  GLUCOSE 134* 84  BUN 19 23*  CREATININE 1.29 1.37*  CALCIUM 9.1 9.1   GFR: CrCl cannot be calculated (Unknown ideal weight.). Liver Function Tests: No results for input(s): AST, ALT, ALKPHOS, BILITOT, PROT, ALBUMIN in the last 168 hours. No results for input(s): LIPASE, AMYLASE in the last 168 hours. No results for input(s): AMMONIA in the last 168 hours. Coagulation Profile: No results for input(s): INR, PROTIME in the last 168 hours. Cardiac Enzymes: No results for input(s): CKTOTAL, CKMB, CKMBINDEX, TROPONINI in the last 168 hours. BNP (last 3 results) No results for input(s): PROBNP in the last 8760 hours. HbA1C: No results for input(s): HGBA1C in the last 72 hours. CBG:  Recent Labs Lab 04/20/16 0023  GLUCAP 98   Lipid Profile: No results for input(s): CHOL, HDL, LDLCALC, TRIG, CHOLHDL, LDLDIRECT in the last 72 hours. Thyroid Function Tests: No results for input(s): TSH, T4TOTAL, FREET4, T3FREE, THYROIDAB in the last 72 hours. Anemia Panel: No results for input(s): VITAMINB12, FOLATE, FERRITIN, TIBC, IRON, RETICCTPCT in the last 72 hours. Urine analysis:    Component Value Date/Time   COLORURINE AMBER (A) 04/19/2016 2004   APPEARANCEUR CLOUDY (A) 04/19/2016 2004   LABSPEC 1.021 04/19/2016 2004   PHURINE 5.0 04/19/2016 2004   GLUCOSEU NEGATIVE 04/19/2016 2004   HGBUR NEGATIVE 04/19/2016 2004   BILIRUBINUR SMALL (A) 04/19/2016 2004   KETONESUR 15 (A) 04/19/2016 2004   PROTEINUR NEGATIVE 04/19/2016 2004   UROBILINOGEN 1.0 05/16/2013 1115   NITRITE NEGATIVE 04/19/2016 2004   LEUKOCYTESUR NEGATIVE 04/19/2016 2004   Sepsis  Labs: (procalcitonin:4,lacticidven:4) )No results found for this or any previous visit (from the past 240 hour(s)).   Radiological Exams on Admission: Ct Head Wo Contrast  Result Date: 04/19/2016 CLINICAL DATA:  Fall with injury/ laceration to the right side of the head. No loss of consciousness. Multiple recent falls. Initial encounter. EXAM: CT HEAD WITHOUT CONTRAST TECHNIQUE: Contiguous axial images were obtained from the base of the skull through the vertex without intravenous contrast. COMPARISON:  None. FINDINGS: There is no evidence of acute cortical infarct, intracranial hemorrhage, mass, midline shift, or extra-axial fluid collection. Ventricles and sulci are within normal limits for age. Subcortical and periventricular white matter hypodensities are nonspecific but compatible with mild chronic small vessel ischemic disease. Orbits are unremarkable. There is mild right parieto-occipital scalp swelling. The visualized paranasal sinuses and mastoid air cells are clear. No skull fracture is identified. Calcified atherosclerosis is noted at the skull base.  IMPRESSION: 1. No evidence of acute intracranial abnormality. 2. Mild right-sided scalp swelling. 3. Mild chronic small vessel ischemic disease. Electronically Signed   By: Sebastian Ache M.D.   On: 04/19/2016 18:02   EKG: Independently reviewed.  Assessment/Plan Principal Problem:   Dehydration Active Problems:   Alcoholic cirrhosis of liver with ascites (HCC)   Dehydration - Iatrogenic  Holding diuretics for tomorrow  Resume on Wed with reduced dose of lasix 40 and aldactone 100  I did discuss with him that this will likely cause his ascites to worsen more quickly and require increased frequency of paracentesis; however, he is currently very orthostatic and falling every time he stands up with 4 falls today alone.   DVT prophylaxis: SCDs only, has chronically elevated INR of about 1.8-1.9 at baseline Code Status:  Full Family Communication: No family in room Consults called: None Admission status: Admit to obs   Hillary Bow DO [facility] Pager [phone] from 7PM-7AM  If 7AM-7PM, please contact the day physician for the patient [email or web address] Password [email or web address]  04/20/2016, 3:04 AM

## 2016-04-29 ENCOUNTER — Telehealth: Payer: Self-pay

## 2016-04-29 NOTE — Telephone Encounter (Signed)
-----   Message from Chrystie Nose, RN sent at 04/13/2016  2:05 PM EDT ----- Regarding: BMET Pt needs labs, order in epic.

## 2016-04-29 NOTE — Telephone Encounter (Signed)
Pt aware and order in epic. 

## 2016-04-30 ENCOUNTER — Other Ambulatory Visit: Payer: Self-pay

## 2016-04-30 ENCOUNTER — Other Ambulatory Visit (INDEPENDENT_AMBULATORY_CARE_PROVIDER_SITE_OTHER): Payer: Medicare Other

## 2016-04-30 ENCOUNTER — Telehealth: Payer: Self-pay | Admitting: Internal Medicine

## 2016-04-30 DIAGNOSIS — R188 Other ascites: Secondary | ICD-10-CM

## 2016-04-30 DIAGNOSIS — K7031 Alcoholic cirrhosis of liver with ascites: Secondary | ICD-10-CM | POA: Diagnosis not present

## 2016-04-30 LAB — BASIC METABOLIC PANEL
BUN: 25 mg/dL — ABNORMAL HIGH (ref 6–23)
CALCIUM: 8.6 mg/dL (ref 8.4–10.5)
CO2: 20 meq/L (ref 19–32)
Chloride: 97 mEq/L (ref 96–112)
Creatinine, Ser: 1.25 mg/dL (ref 0.40–1.50)
GFR: 62.76 mL/min (ref >60.00)
GLUCOSE: 96 mg/dL (ref 70–99)
POTASSIUM: 3.8 meq/L (ref 3.5–5.1)
SODIUM: 126 meq/L — AB (ref 135–145)

## 2016-04-30 NOTE — Telephone Encounter (Signed)
Large-volume paracentesis up to 10 L with albumin replacement if creatinine not significantly changed. Send fluid for cell count with differential

## 2016-04-30 NOTE — Telephone Encounter (Signed)
Pt came for BMET today, result not back yet. Pt states he is very bloated again and is requesting to have a paracentesis done. Please advise.

## 2016-05-03 ENCOUNTER — Other Ambulatory Visit: Payer: Self-pay

## 2016-05-03 DIAGNOSIS — K7469 Other cirrhosis of liver: Secondary | ICD-10-CM

## 2016-05-03 NOTE — Telephone Encounter (Signed)
Pt scheduled for US para with albumin [facility] at [facility] 05/04/16@2 :30pm, pt to arrive there at 2:15pm. Pt aware of appt.

## 2016-05-04 ENCOUNTER — Ambulatory Visit (HOSPITAL_COMMUNITY)
Admission: RE | Admit: 2016-05-04 | Discharge: 2016-05-04 | Disposition: A | Discharge status: Home / Self Care | Payer: Medicare Other | Source: Ambulatory Visit | Attending: Internal Medicine | Admitting: Internal Medicine

## 2016-05-04 DIAGNOSIS — R188 Other ascites: Secondary | ICD-10-CM | POA: Diagnosis not present

## 2016-05-04 DIAGNOSIS — K7031 Alcoholic cirrhosis of liver with ascites: Secondary | ICD-10-CM | POA: Diagnosis not present

## 2016-05-04 LAB — BODY FLUID CELL COUNT WITH DIFFERENTIAL
Eos, Fluid: 0 %
Lymphs, Fluid: 46 %
Monocyte-Macrophage-Serous Fluid: 52 % (ref 50–90)
NEUTROPHIL FLUID: 2 % (ref 0–25)
Total Nucleated Cell Count, Fluid: 56 cu mm (ref 0–1000)

## 2016-05-04 MED ORDER — ALBUMIN HUMAN 25 % IV SOLN
75.0000 g | Freq: Once | INTRAVENOUS | Status: AC
  Administered 2016-05-04: 75 g via INTRAVENOUS
  Filled 2016-05-04: qty 300

## 2016-05-04 MED ORDER — LIDOCAINE HCL (PF) 1 % IJ SOLN
INTRAMUSCULAR | Status: AC
  Filled 2016-05-04: qty 10

## 2016-05-04 NOTE — Procedures (Signed)
Successful US guided paracentesis from right lateral abdomen.  Yielded 10 liters of cloudy yellow fluid.  No immediate complications.  Pt tolerated well.   Specimen was sent for labs.  Mainor Hellmann S Talar Fraley PA-C 05/04/2016 4:04 PM

## 2016-05-05 LAB — PATHOLOGIST SMEAR REVIEW

## 2016-05-17 ENCOUNTER — Other Ambulatory Visit: Payer: Self-pay

## 2016-05-17 ENCOUNTER — Other Ambulatory Visit (INDEPENDENT_AMBULATORY_CARE_PROVIDER_SITE_OTHER): Payer: Medicare Other

## 2016-05-17 ENCOUNTER — Telehealth: Payer: Self-pay

## 2016-05-17 DIAGNOSIS — E871 Hypo-osmolality and hyponatremia: Secondary | ICD-10-CM

## 2016-05-17 DIAGNOSIS — K7469 Other cirrhosis of liver: Secondary | ICD-10-CM

## 2016-05-17 LAB — BASIC METABOLIC PANEL
BUN: 17 mg/dL (ref 6–23)
CALCIUM: 8.2 mg/dL — AB (ref 8.4–10.5)
CO2: 18 mEq/L — ABNORMAL LOW (ref 19–32)
Chloride: 95 mEq/L — ABNORMAL LOW (ref 96–112)
Creatinine, Ser: 1.17 mg/dL (ref 0.40–1.50)
GFR: 67.72 mL/min (ref >60.00)
Glucose, Bld: 129 mg/dL — ABNORMAL HIGH (ref 70–99)
Potassium: 4.1 mEq/L (ref 3.5–5.1)
SODIUM: 123 meq/L — AB (ref 135–145)

## 2016-05-17 NOTE — Telephone Encounter (Signed)
Pt aware.

## 2016-05-17 NOTE — Telephone Encounter (Signed)
-----   Message from Chrystie NoseLinda R Devansh Riese, RN sent at 05/03/2016  9:01 AM EDT ----- Regarding: bmet Needs bmet in 2 weeks, order in epic

## 2016-05-20 ENCOUNTER — Telehealth: Payer: Self-pay | Admitting: Internal Medicine

## 2016-05-20 NOTE — Telephone Encounter (Signed)
Devin Noble pt with alcoholic cirrhosis of liver with ascites. Pt states his abdomen is very swollen and he is requesting a paracentesis. Pt usually had 10 liter removed and gets albumin replacement. Pt is requesting to have 12 liters removed this time. Dr. Myrtie Neitheranis as doc of the day please advise.

## 2016-05-20 NOTE — Telephone Encounter (Signed)
I reviewed Dr Lamar SprinklesPerry's last office note and orders for recent paracentesis.  It is not considered safe to remove more than 10 liters at once in a patient who runs a low sodium level like he does, even when albumin is given.  Please schedule a 10 liter paracentesis with infusion of 50 grams of 25% albumin.  Then he should be sure to keep his 9/27 appointment with Dr Marina GoodellPerry to discuss options for long term management of this fluid.

## 2016-05-21 ENCOUNTER — Other Ambulatory Visit: Payer: Self-pay

## 2016-05-21 DIAGNOSIS — R188 Other ascites: Secondary | ICD-10-CM

## 2016-05-21 MED ORDER — ALBUMIN HUMAN 25 % IV SOLN: 50.0000 g | Freq: Once | INTRAVENOUS | Status: DC

## 2016-05-21 NOTE — Telephone Encounter (Signed)
Pt scheduled for US para a [facility] 05/24/16@10am , pt to arrive there at 9:45am. Pt aware of appt.

## 2016-05-24 ENCOUNTER — Ambulatory Visit (HOSPITAL_COMMUNITY): Payer: Medicare Other

## 2016-05-24 NOTE — Telephone Encounter (Signed)
Noted  

## 2016-05-25 ENCOUNTER — Ambulatory Visit (HOSPITAL_COMMUNITY)
Admission: RE | Admit: 2016-05-25 | Discharge: 2016-05-25 | Disposition: A | Discharge status: Home / Self Care | Payer: Medicare Other | Source: Ambulatory Visit | Attending: Gastroenterology | Admitting: Gastroenterology

## 2016-05-25 DIAGNOSIS — R188 Other ascites: Secondary | ICD-10-CM | POA: Diagnosis not present

## 2016-05-25 LAB — BODY FLUID CELL COUNT WITH DIFFERENTIAL
EOS FL: 0 %
LYMPHS FL: 35 %
MONOCYTE-MACROPHAGE-SEROUS FLUID: 64 % (ref 50–90)
Neutrophil Count, Fluid: 1 % (ref 0–25)
Total Nucleated Cell Count, Fluid: 36 cu mm (ref 0–1000)

## 2016-05-25 MED ORDER — LIDOCAINE HCL 1 % IJ SOLN
INTRAMUSCULAR | Status: AC
  Filled 2016-05-25: qty 20

## 2016-05-25 MED ORDER — ALBUMIN HUMAN 25 % IV SOLN
50.0000 g | Freq: Once | INTRAVENOUS | Status: AC
  Administered 2016-05-25: 50 g via INTRAVENOUS
  Filled 2016-05-25: qty 200

## 2016-05-25 NOTE — Procedures (Signed)
Ultrasound-guided diagnostic and therapeutic paracentesis performed yielding 10  liters of chylous  colored fluid. No immediate complications.  Mandy Fitzwater E 05/25/2016

## 2016-05-26 LAB — PATHOLOGIST SMEAR REVIEW

## 2016-06-03 ENCOUNTER — Encounter: Payer: Self-pay | Admitting: Physician Assistant

## 2016-06-03 ENCOUNTER — Other Ambulatory Visit: Payer: Self-pay | Admitting: *Deleted

## 2016-06-03 ENCOUNTER — Other Ambulatory Visit (INDEPENDENT_AMBULATORY_CARE_PROVIDER_SITE_OTHER): Payer: Medicare Other

## 2016-06-03 ENCOUNTER — Ambulatory Visit (INDEPENDENT_AMBULATORY_CARE_PROVIDER_SITE_OTHER): Payer: Medicare Other | Admitting: Physician Assistant

## 2016-06-03 VITALS — BP 100/66 | HR 124 | Ht 71.0 in | Wt 199.0 lb

## 2016-06-03 DIAGNOSIS — D649 Anemia, unspecified: Secondary | ICD-10-CM

## 2016-06-03 DIAGNOSIS — R188 Other ascites: Secondary | ICD-10-CM | POA: Diagnosis not present

## 2016-06-03 DIAGNOSIS — K7290 Hepatic failure, unspecified without coma: Secondary | ICD-10-CM

## 2016-06-03 DIAGNOSIS — R42 Dizziness and giddiness: Secondary | ICD-10-CM | POA: Diagnosis not present

## 2016-06-03 DIAGNOSIS — N179 Acute kidney failure, unspecified: Secondary | ICD-10-CM | POA: Diagnosis not present

## 2016-06-03 DIAGNOSIS — K7460 Unspecified cirrhosis of liver: Secondary | ICD-10-CM

## 2016-06-03 DIAGNOSIS — I951 Orthostatic hypotension: Secondary | ICD-10-CM | POA: Diagnosis not present

## 2016-06-03 DIAGNOSIS — E871 Hypo-osmolality and hyponatremia: Secondary | ICD-10-CM

## 2016-06-03 LAB — COMPREHENSIVE METABOLIC PANEL
ALT: 12 U/L (ref 0–53)
AST: 20 U/L (ref 0–37)
Albumin: 2.9 g/dL — ABNORMAL LOW (ref 3.5–5.2)
Alkaline Phosphatase: 99 U/L (ref 39–117)
BUN: 22 mg/dL (ref 6–23)
CALCIUM: 8.5 mg/dL (ref 8.4–10.5)
CHLORIDE: 99 meq/L (ref 96–112)
CO2: 21 meq/L (ref 19–32)
Creatinine, Ser: 2.28 mg/dL — ABNORMAL HIGH (ref 0.40–1.50)
GFR: 31.35 mL/min — AB (ref >60.00)
Glucose, Bld: 110 mg/dL — ABNORMAL HIGH (ref 70–99)
Potassium: 4.8 mEq/L (ref 3.5–5.1)
Sodium: 126 mEq/L — ABNORMAL LOW (ref 135–145)
Total Bilirubin: 1.8 mg/dL — ABNORMAL HIGH (ref 0.2–1.2)
Total Protein: 6.7 g/dL (ref 6.0–8.3)

## 2016-06-03 LAB — CBC WITH DIFFERENTIAL/PLATELET
Basophils Relative: 0.8 % (ref 0.0–3.0)
EOS PCT: 4.2 % (ref 0.0–5.0)
Hemoglobin: 7.4 g/dL — CL (ref 13.0–17.0)
LYMPHS PCT: 8.8 % — AB (ref 12.0–46.0)
MCHC: 34.5 g/dL (ref 30.0–36.0)
MCV: 75.1 fl — AB (ref 78.0–100.0)
Monocytes Relative: 26.4 % — ABNORMAL HIGH (ref 3.0–12.0)
Neutrophils Relative %: 59.8 % (ref 43.0–77.0)
PLATELETS: 198 10*3/uL (ref 150.0–400.0)
RBC: 2.86 Mil/uL — ABNORMAL LOW (ref 4.22–5.81)
RDW: 18.6 % — AB (ref 11.5–15.5)
WBC: 4.9 10*3/uL (ref 4.0–10.5)

## 2016-06-03 NOTE — Progress Notes (Signed)
Subjective:    Patient ID: Devin Noble, male    DOB: 06/24/1956, 60 y.o.   MRN: [id number]  HPI Devin Noble is a 60 year old white male known to Dr. 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 He has had 2 recent hospitalizations both in July 2017 due to complications of his liver disease, acute kidney injury dehydration and syncope. He did undergo EGD in July 2017 for screening for varices and was found to have grade 2 esophageal varices and portal gastropathy.He has required repeated paracenteses (has had done 11 times this year) and has requested large volume paracenteses. He last underwent paracentesis on 05/25/2016 The patient called and was requesting to have 12 L removed. He was told that this was too much, he was authorized to have 10 L removed and received 50 g of albumin post procedure. He is currently maintained on Lasix 40 mg daily and Aldactone 100 mg by mouth daily. He came to the office today, walked in with complaint of dizziness. Labs were done and he was added on to the afternoon schedule. Patient says that he had been feeling okay over the past few days but noticed onset of dizziness this morning and was dizzy and his car while he was driving here. He describes it more of a lightheadedness. He denies any falls and does not feel that she's been on stable with ambulation. No melena or hematochezia. He says all of the fluid has reaccumulated in his abdomen already and he is uncomfortable somewhat short of breath due to ascites. Patient has not been drinking since January 2017 and is awaiting his initial appointment with 's liver clinic for transplant evaluation. Labs done today-hemoglobin 7.4, hematocrit 21.5, MCV of 75, platelets 198. This is a drop in hemoglobin from 8.8 when last checked 04/19/2016 Sodium is 126, about his baseline, BUN 22, creatinine 2.28 which is up from 1.1 on 05/17/2016, liver tests are normal albumin  2.9   Review of Systems Pertinent positive and negative review of systems were noted in the above HPI section.  All other review of systems was otherwise negative.  Outpatient Encounter Prescriptions as of 06/03/2016  Medication Sig  . furosemide (LASIX) 40 MG tablet Take 1 tablet (40 mg total) by mouth daily.  Marland Kitchen spironolactone (ALDACTONE) 100 MG tablet Take 1 tablet (100 mg total) by mouth daily.   Facility-Administered Encounter Medications as of 06/03/2016  Medication  . albumin human 25 % solution 50 g   Allergies  Allergen Reactions  . Tramadol Hives and Itching   Patient Active Problem List   Diagnosis Date Noted  . Dehydration 04/20/2016  . AKI (acute kidney injury) (HCC) 04/20/2016  . Secondary esophageal varices without bleeding (HCC)   . Orthostatic hypotension 01/21/2016  . Fall 01/21/2016  . Hyponatremia 01/21/2016  . History of alcohol abuse - quit Jan 2017 01/21/2016  . Intravascular volume depletion 01/21/2016  . Alcoholic cirrhosis of liver with ascites (HCC) 11/10/2015  . Anemia, iron deficiency 10/24/2015  . Iron deficiency anemia due to chronic blood loss 10/02/2015  . Protein-calorie malnutrition, severe 10/01/2015  . Rectal bleeding 12/26/2014  . AP (abdominal pain) 12/26/2014  . Normocytic anemia 12/26/2014   Social History   Social History  . Marital status: Single    Spouse name: N/A  . Number of children: 0  . Years of education: N/A   Occupational History  . Diability    Social History Main Topics  . Smoking status: Never Smoker  .  Smokeless tobacco: Never Used  . Alcohol use No     Comment: No alcholol since 09/21/15  . Drug use: No  . Sexual activity: Not Currently   Other Topics Concern  . Not on file   Social History Narrative   Worked previously as a Emergency planning/management officerschool bus mechanic. He was forced into retirement/fired. When he was unable to perform his work because of shoulder issues.   His social support system includes a 6970  something-year-old friend who does not drive and her children, most of whom work during the day. However one of these children is caring for the patient's dog while he is in the hospital.( Dated 09/2015)    Devin Noble's family history includes Gallbladder disease in his mother.      Objective:    Vitals:   06/03/16 1432 06/03/16 1445  BP: 100/76 100/66  Pulse: 71 (!) 124    Physical Exam  well-developed chronically ill-appearing white male, sitting in a wheelchair in no acute distress, blood pressure 100/76 pulse 71 sitting on standing blood pressure 100/66 and pulse 124. HEENT; nontraumatic normocephalic does have an ecchymosis under his right eye sclerae are mildly icteric, Cardiovascular; regular rate and rhythm with S1-S2 no murmur or gallop, Pulmonary; patient has some Rales in the right base, Abdomen; large fairly tense ascites, nontender, bowel sounds are present no palpable hepatosplenomegaly, Rectal ;exam not done, Extremities; trace edema in the shins and ankles, Neuropsych ;mood and affect appropriate, no asterixis       Assessment & Plan:   #501 60 year old white male with decompensated alcoholic liver disease and refractory ascites that has required numerous paracenteses over the past several months and just had a 10 L paracentesis done about a week ago who presents with dizziness and orthostasis and lab findings consistent with volume depletion and acute kidney injury. He is also had a drop in his hemoglobin without any overt bleeding and may be oozing intermittently from portal gastropathy. Current MELD-NA =28  Plan; Long discussion with the patient today regarding his orthostasis and acute kidney injury and drop in hemoglobin. He was very strongly advised that he needed to be hospitalized and also advised him that he should not be driving nor should he drive himself to the hospital with complaints of dizziness. Patient was adamant that he did not want to be readmitted to the  hospital because he had already been there twice, he does not like [facility], and he can't afford to be rehospitalized. He stated he wanted to wait to see what the transplant team advised. Again I strongly advised that he needed to be admitted to the hospital today so that he could be adequately cared for, and get him well enough to go through a transplant evaluation.. I left the room to discuss with the supervising physician and the patient left the office AMA. No other instructions were given to him as he left the office.  I will discuss further with Dr. Marina GoodellPerry, patient may be discharged from our practice.  Amy S Esterwood PA-C 06/03/2016   Cc: No ref. provider found

## 2016-06-03 NOTE — Progress Notes (Signed)
Reviewed and agree with assessment and recommendations. Unfortunately, very ill gentleman with progressive liver disease. I sympathize with his his concerns, but medical compliance is critically important. I have discussed this with him previously. Furthermore, he should not be driving in his current state. His prognosis is poor. I will have my nurse call him and recommend that he take iron sulfate 325 mg twice daily, repeat CBC and BMET in one week, present to the hospital as recommended, don't drive as recommended. We will give him a scheduled OV with an extender on a date when I am supervising in about 4 weeks (whether he ends up in hospital or not).

## 2016-06-04 ENCOUNTER — Other Ambulatory Visit: Payer: Self-pay

## 2016-06-04 DIAGNOSIS — K7031 Alcoholic cirrhosis of liver with ascites: Secondary | ICD-10-CM

## 2016-06-04 NOTE — Progress Notes (Signed)
Pt already has an ov scheduled with Dr. Marina GoodellPerry for 06/23/16, he will keep this appt.

## 2016-06-04 NOTE — Progress Notes (Signed)
Spoke with pt and he is aware of Dr. Lamar SprinklesPerry's recommendations. Pt knows to take the iron as instructed. States he will need a reminder call regarding the labs next week because he is "right stupid now." Discussed with him that he should go to the hospital as was recommended, pt states he is at the grocery store right now.

## 2016-06-07 DIAGNOSIS — K7030 Alcoholic cirrhosis of liver without ascites: Secondary | ICD-10-CM | POA: Diagnosis not present

## 2016-06-07 DIAGNOSIS — R188 Other ascites: Secondary | ICD-10-CM | POA: Diagnosis not present

## 2016-06-07 DIAGNOSIS — K7290 Hepatic failure, unspecified without coma: Secondary | ICD-10-CM | POA: Diagnosis not present

## 2016-06-08 ENCOUNTER — Telehealth: Payer: Self-pay | Admitting: Internal Medicine

## 2016-06-08 ENCOUNTER — Other Ambulatory Visit: Payer: Self-pay

## 2016-06-08 DIAGNOSIS — R188 Other ascites: Secondary | ICD-10-CM

## 2016-06-08 NOTE — Telephone Encounter (Signed)
[facility] scheduled [facility] 06/09/16@2pm  for US para with albumin replacement. Pt to arrive there at 1:45pm. Pt to have 8 liters removed, albumin 8gm IV for each liter removed, fluid for cell count and diff. Left message for pt to call back.

## 2016-06-08 NOTE — Telephone Encounter (Signed)
Pt called requesting to have a paracentesis. States that he is having difficulty breathing because of all the fluid. Please advise.

## 2016-06-08 NOTE — Telephone Encounter (Signed)
Paracentesis up to 8 L with albumin replacement. Send fluid for cell count with differential

## 2016-06-09 ENCOUNTER — Ambulatory Visit (HOSPITAL_COMMUNITY)
Admission: RE | Admit: 2016-06-09 | Discharge: 2016-06-09 | Disposition: A | Discharge status: Home / Self Care | Payer: Medicare Other | Source: Ambulatory Visit | Attending: Internal Medicine | Admitting: Internal Medicine

## 2016-06-09 DIAGNOSIS — K7460 Unspecified cirrhosis of liver: Secondary | ICD-10-CM | POA: Diagnosis not present

## 2016-06-09 DIAGNOSIS — R188 Other ascites: Secondary | ICD-10-CM | POA: Insufficient documentation

## 2016-06-09 LAB — BODY FLUID CELL COUNT WITH DIFFERENTIAL
EOS FL: 2 %
LYMPHS FL: 37 %
Monocyte-Macrophage-Serous Fluid: 60 % (ref 50–90)
NEUTROPHIL FLUID: 1 % (ref 0–25)
WBC FLUID: 73 uL (ref 0–1000)

## 2016-06-09 MED ORDER — ALBUMIN HUMAN 25 % IV SOLN
64.0000 g | Freq: Once | INTRAVENOUS | Status: AC
  Administered 2016-06-09: 64 g via INTRAVENOUS
  Filled 2016-06-09: qty 300

## 2016-06-09 MED ORDER — LIDOCAINE HCL 1 % IJ SOLN
INTRAMUSCULAR | Status: AC
  Filled 2016-06-09: qty 20

## 2016-06-09 NOTE — Telephone Encounter (Signed)
Spoke with emergency contact listed in chart to see if they could reach pt. She states she gets the same message that the voicemail box is full. States she will have another friend go check on him, she is aware of appt date and time and will try to let him know.

## 2016-06-09 NOTE — Procedures (Signed)
Ultrasound-guided diagnostic and therapeutic paracentesis performed yielding 8 liters of chylotic colored fluid. No immediate complications.  Devin Noble E 2:56 PM 06/09/2016

## 2016-06-10 ENCOUNTER — Other Ambulatory Visit: Payer: Self-pay | Admitting: Nurse Practitioner

## 2016-06-10 DIAGNOSIS — K7030 Alcoholic cirrhosis of liver without ascites: Secondary | ICD-10-CM

## 2016-06-10 LAB — PATHOLOGIST SMEAR REVIEW

## 2016-06-23 ENCOUNTER — Ambulatory Visit: Payer: Medicare Other | Admitting: Internal Medicine

## 2016-06-29 ENCOUNTER — Telehealth: Payer: Self-pay

## 2016-06-29 ENCOUNTER — Other Ambulatory Visit: Payer: Self-pay

## 2016-06-29 DIAGNOSIS — K7031 Alcoholic cirrhosis of liver with ascites: Secondary | ICD-10-CM

## 2016-06-29 NOTE — Telephone Encounter (Signed)
Up to 8 liters with albumin replacement. Send fluid for cell count differential

## 2016-06-29 NOTE — Telephone Encounter (Signed)
[facility]30/16[facility]2pm , pt to arrive there at 1:45pm. Pt to receive Albumin 8gm IV for each liter of fluid drawn off. Fluid to be sent for cell count and diff. Pt aware of appt.

## 2016-06-29 NOTE — Telephone Encounter (Signed)
Pt states his belly is huge again and he is short of breath. Asked pt how much wt gain he has had but he states he has not weighted. Pt requesting US paracentesis. Please advise.

## 2016-06-30 ENCOUNTER — Ambulatory Visit (HOSPITAL_COMMUNITY)
Admission: RE | Admit: 2016-06-30 | Discharge: 2016-06-30 | Disposition: A | Discharge status: Home / Self Care | Payer: Medicare Other | Source: Ambulatory Visit | Attending: Internal Medicine | Admitting: Internal Medicine

## 2016-06-30 DIAGNOSIS — F10988 Alcohol use, unspecified with other alcohol-induced disorder: Secondary | ICD-10-CM | POA: Diagnosis present

## 2016-06-30 DIAGNOSIS — K7031 Alcoholic cirrhosis of liver with ascites: Secondary | ICD-10-CM | POA: Diagnosis present

## 2016-06-30 DIAGNOSIS — R188 Other ascites: Secondary | ICD-10-CM | POA: Diagnosis not present

## 2016-06-30 LAB — BODY FLUID CELL COUNT WITH DIFFERENTIAL
Lymphs, Fluid: 70 %
Monocyte-Macrophage-Serous Fluid: 28 % — ABNORMAL LOW (ref 50–90)
Neutrophil Count, Fluid: 2 % (ref 0–25)
Total Nucleated Cell Count, Fluid: 53 cu mm (ref 0–1000)

## 2016-06-30 MED ORDER — ALBUMIN HUMAN 25 % IV SOLN
62.5000 g | Freq: Once | INTRAVENOUS | Status: AC
  Administered 2016-06-30: 62.5 g via INTRAVENOUS
  Filled 2016-06-30: qty 300

## 2016-06-30 MED ORDER — ALBUMIN HUMAN 25 % IV SOLN
64.0000 g | Freq: Once | INTRAVENOUS | Status: DC
  Filled 2016-06-30: qty 300

## 2016-06-30 MED ORDER — LIDOCAINE HCL (PF) 1 % IJ SOLN
INTRAMUSCULAR | Status: AC
  Filled 2016-06-30: qty 10

## 2016-06-30 NOTE — Procedures (Signed)
Ultrasound-guided diagnostic and therapeutic paracentesis performed yielding 8 liters of chylous-colored fluid. No immediate complications.  Shlonda Dolloff E 3:55 PM 06/30/2016

## 2016-07-01 LAB — PATHOLOGIST SMEAR REVIEW

## 2016-07-04 ENCOUNTER — Other Ambulatory Visit: Payer: Self-pay | Admitting: Internal Medicine

## 2016-07-07 ENCOUNTER — Ambulatory Visit
Admission: RE | Admit: 2016-07-07 | Discharge: 2016-07-07 | Disposition: A | Discharge status: Home / Self Care | Payer: Medicare Other | Source: Ambulatory Visit | Attending: Nurse Practitioner | Admitting: Nurse Practitioner

## 2016-07-07 DIAGNOSIS — K7030 Alcoholic cirrhosis of liver without ascites: Secondary | ICD-10-CM

## 2016-07-07 DIAGNOSIS — K7460 Unspecified cirrhosis of liver: Secondary | ICD-10-CM | POA: Diagnosis not present

## 2016-07-12 ENCOUNTER — Telehealth: Payer: Self-pay | Admitting: Internal Medicine

## 2016-07-12 NOTE — Telephone Encounter (Signed)
Devin Noble pt with h/o alcoholic cirrhosis of the liver with ascites. Pt states his abdomen is very swollen and he is short of breath. Pt requesting to have an US para done. Pts last para was 06/30/16 and pt received Albumin 8gm IV for each liter of fluid drawn off, fluid sent for cell count and diff. Dr. Christella HartiganJacobs as doc of the day please advise.

## 2016-07-13 ENCOUNTER — Other Ambulatory Visit: Payer: Self-pay

## 2016-07-13 ENCOUNTER — Other Ambulatory Visit (INDEPENDENT_AMBULATORY_CARE_PROVIDER_SITE_OTHER): Payer: Medicare Other

## 2016-07-13 DIAGNOSIS — K7031 Alcoholic cirrhosis of liver with ascites: Secondary | ICD-10-CM

## 2016-07-13 LAB — BASIC METABOLIC PANEL
BUN: 17 mg/dL (ref 6–23)
CALCIUM: 8.5 mg/dL (ref 8.4–10.5)
CO2: 19 mEq/L (ref 19–32)
Chloride: 102 mEq/L (ref 96–112)
Creatinine, Ser: 1.08 mg/dL (ref 0.40–1.50)
GFR: 74.24 mL/min (ref >60.00)
GLUCOSE: 163 mg/dL — AB (ref 70–99)
POTASSIUM: 4.9 meq/L (ref 3.5–5.1)
SODIUM: 128 meq/L — AB (ref 135–145)

## 2016-07-13 LAB — CBC WITH DIFFERENTIAL/PLATELET
BASOS PCT: 1.6 % (ref 0.0–3.0)
Basophils Absolute: 0.1 10*3/uL (ref 0.0–0.1)
EOS ABS: 0.5 10*3/uL (ref 0.0–0.7)
Eosinophils Relative: 10.7 % — ABNORMAL HIGH (ref 0.0–5.0)
HCT: 23 % — CL (ref 39.0–52.0)
HEMOGLOBIN: 7.4 g/dL — AB (ref 13.0–17.0)
LYMPHS ABS: 0.6 10*3/uL — AB (ref 0.7–4.0)
Lymphocytes Relative: 13.1 % (ref 12.0–46.0)
MCHC: 32.4 g/dL (ref 30.0–36.0)
MCV: 74.8 fl — ABNORMAL LOW (ref 78.0–100.0)
MONO ABS: 1 10*3/uL (ref 0.1–1.0)
Monocytes Relative: 20.4 % — ABNORMAL HIGH (ref 3.0–12.0)
NEUTROS PCT: 54.2 % (ref 43.0–77.0)
Neutro Abs: 2.6 10*3/uL (ref 1.4–7.7)
PLATELETS: 258 10*3/uL (ref 150.0–400.0)
RBC: 3.07 Mil/uL — ABNORMAL LOW (ref 4.22–5.81)
RDW: 19.6 % — AB (ref 11.5–15.5)
WBC: 4.8 10*3/uL (ref 4.0–10.5)

## 2016-07-13 NOTE — Telephone Encounter (Signed)
Pt aware and will come for labs today. [facility] 07/14/16@2pm , pt to arrive there at 1:45pm. No more than 8 liters to be removed, fluid to be sent for cell count and diff, pt to receive albumin 8gm IV for each liter removed. Pt aware of appt.

## 2016-07-13 NOTE — Telephone Encounter (Signed)
He needs repeat LVP with albumin infusion (same dose as previously), up to 8 liters removed.  He also needs BMET, his last Cr was 2.3 and may be higher now.  Again send for cell count and differential.

## 2016-07-14 ENCOUNTER — Other Ambulatory Visit: Payer: Self-pay

## 2016-07-14 ENCOUNTER — Ambulatory Visit (HOSPITAL_COMMUNITY)
Admission: RE | Admit: 2016-07-14 | Discharge: 2016-07-14 | Disposition: A | Discharge status: Home / Self Care | Payer: Medicare Other | Source: Ambulatory Visit | Attending: Gastroenterology | Admitting: Gastroenterology

## 2016-07-14 DIAGNOSIS — K7031 Alcoholic cirrhosis of liver with ascites: Secondary | ICD-10-CM | POA: Diagnosis not present

## 2016-07-14 DIAGNOSIS — R188 Other ascites: Secondary | ICD-10-CM | POA: Diagnosis not present

## 2016-07-14 DIAGNOSIS — D649 Anemia, unspecified: Secondary | ICD-10-CM

## 2016-07-14 LAB — BODY FLUID CELL COUNT WITH DIFFERENTIAL
EOS FL: 1 %
LYMPHS FL: 69 %
MONOCYTE-MACROPHAGE-SEROUS FLUID: 25 % — AB (ref 50–90)
NEUTROPHIL FLUID: 5 % (ref 0–25)
Total Nucleated Cell Count, Fluid: 17 cu mm (ref 0–1000)

## 2016-07-14 MED ORDER — ALBUMIN HUMAN 25 % IV SOLN
12.5000 g | Freq: Once | INTRAVENOUS | Status: DC
  Filled 2016-07-14: qty 50

## 2016-07-14 MED ORDER — ALBUMIN HUMAN 25 % IV SOLN
62.5000 g | Freq: Once | INTRAVENOUS | Status: AC
  Administered 2016-07-14: 62.5 g via INTRAVENOUS
  Filled 2016-07-14: qty 300

## 2016-07-14 MED ORDER — LIDOCAINE HCL 1 % IJ SOLN
INTRAMUSCULAR | Status: AC
  Filled 2016-07-14: qty 20

## 2016-07-14 NOTE — Procedures (Signed)
Successful US guided paracentesis from right lateral abdomen.  Yielded 8 liters of milky like yellow fluid.  No immediate complications.  Pt tolerated well.   Specimen was sent for labs.  Lucero Ide S Bettylee Feig PA-C 07/14/2016 3:19 PM

## 2016-07-15 LAB — PATHOLOGIST SMEAR REVIEW

## 2016-07-25 ENCOUNTER — Inpatient Hospital Stay (HOSPITAL_COMMUNITY)
Admission: EM | Admit: 2016-07-25 | Discharge: 2016-07-30 | DRG: 682 | Disposition: A | Discharge status: Hospice | Payer: Medicare Other | Attending: Internal Medicine | Admitting: Internal Medicine

## 2016-07-25 ENCOUNTER — Encounter (HOSPITAL_COMMUNITY): Payer: Self-pay

## 2016-07-25 DIAGNOSIS — D684 Acquired coagulation factor deficiency: Secondary | ICD-10-CM | POA: Diagnosis present

## 2016-07-25 DIAGNOSIS — D6959 Other secondary thrombocytopenia: Secondary | ICD-10-CM | POA: Diagnosis not present

## 2016-07-25 DIAGNOSIS — D500 Iron deficiency anemia secondary to blood loss (chronic): Secondary | ICD-10-CM | POA: Diagnosis not present

## 2016-07-25 DIAGNOSIS — R0603 Acute respiratory distress: Secondary | ICD-10-CM

## 2016-07-25 DIAGNOSIS — R0600 Dyspnea, unspecified: Secondary | ICD-10-CM | POA: Diagnosis not present

## 2016-07-25 DIAGNOSIS — Z96611 Presence of right artificial shoulder joint: Secondary | ICD-10-CM | POA: Diagnosis present

## 2016-07-25 DIAGNOSIS — R296 Repeated falls: Secondary | ICD-10-CM | POA: Diagnosis present

## 2016-07-25 DIAGNOSIS — Z6830 Body mass index (BMI) 30.0-30.9, adult: Secondary | ICD-10-CM

## 2016-07-25 DIAGNOSIS — N179 Acute kidney failure, unspecified: Principal | ICD-10-CM

## 2016-07-25 DIAGNOSIS — Z515 Encounter for palliative care: Secondary | ICD-10-CM

## 2016-07-25 DIAGNOSIS — K652 Spontaneous bacterial peritonitis: Secondary | ICD-10-CM | POA: Diagnosis present

## 2016-07-25 DIAGNOSIS — W19XXXA Unspecified fall, initial encounter: Secondary | ICD-10-CM

## 2016-07-25 DIAGNOSIS — Z79899 Other long term (current) drug therapy: Secondary | ICD-10-CM | POA: Diagnosis not present

## 2016-07-25 DIAGNOSIS — R188 Other ascites: Secondary | ICD-10-CM

## 2016-07-25 DIAGNOSIS — K7031 Alcoholic cirrhosis of liver with ascites: Secondary | ICD-10-CM

## 2016-07-25 DIAGNOSIS — K766 Portal hypertension: Secondary | ICD-10-CM | POA: Diagnosis present

## 2016-07-25 DIAGNOSIS — E876 Hypokalemia: Secondary | ICD-10-CM | POA: Diagnosis not present

## 2016-07-25 DIAGNOSIS — Z66 Do not resuscitate: Secondary | ICD-10-CM

## 2016-07-25 DIAGNOSIS — E871 Hypo-osmolality and hyponatremia: Secondary | ICD-10-CM | POA: Diagnosis present

## 2016-07-25 DIAGNOSIS — K7682 Hepatic encephalopathy: Secondary | ICD-10-CM | POA: Diagnosis present

## 2016-07-25 DIAGNOSIS — D638 Anemia in other chronic diseases classified elsewhere: Secondary | ICD-10-CM | POA: Diagnosis present

## 2016-07-25 DIAGNOSIS — K7290 Hepatic failure, unspecified without coma: Secondary | ICD-10-CM | POA: Diagnosis not present

## 2016-07-25 DIAGNOSIS — K659 Peritonitis, unspecified: Secondary | ICD-10-CM | POA: Diagnosis not present

## 2016-07-25 DIAGNOSIS — R5381 Other malaise: Secondary | ICD-10-CM | POA: Diagnosis present

## 2016-07-25 DIAGNOSIS — K3189 Other diseases of stomach and duodenum: Secondary | ICD-10-CM | POA: Diagnosis present

## 2016-07-25 DIAGNOSIS — E43 Unspecified severe protein-calorie malnutrition: Secondary | ICD-10-CM | POA: Diagnosis present

## 2016-07-25 DIAGNOSIS — I10 Essential (primary) hypertension: Secondary | ICD-10-CM | POA: Diagnosis present

## 2016-07-25 DIAGNOSIS — L03115 Cellulitis of right lower limb: Secondary | ICD-10-CM | POA: Diagnosis not present

## 2016-07-25 DIAGNOSIS — D509 Iron deficiency anemia, unspecified: Secondary | ICD-10-CM | POA: Diagnosis not present

## 2016-07-25 LAB — CBC WITH DIFFERENTIAL/PLATELET
BASOS ABS: 0.1 10*3/uL (ref 0.0–0.1)
Basophils Relative: 1 %
EOS ABS: 0.3 10*3/uL (ref 0.0–0.7)
Eosinophils Relative: 4 %
HCT: 23.3 % — ABNORMAL LOW (ref 39.0–52.0)
HEMOGLOBIN: 7.3 g/dL — AB (ref 13.0–17.0)
LYMPHS ABS: 1.2 10*3/uL (ref 0.7–4.0)
LYMPHS PCT: 14 %
MCH: 23.8 pg — ABNORMAL LOW (ref 26.0–34.0)
MCHC: 31.3 g/dL (ref 30.0–36.0)
MCV: 75.9 fL — ABNORMAL LOW (ref 78.0–100.0)
MONOS PCT: 25 %
Monocytes Absolute: 2.1 10*3/uL — ABNORMAL HIGH (ref 0.1–1.0)
NEUTROS ABS: 4.6 10*3/uL (ref 1.7–7.7)
Neutrophils Relative %: 56 %
Platelets: 194 10*3/uL (ref 150–400)
RBC: 3.07 MIL/uL — AB (ref 4.22–5.81)
RDW: 21.7 % — AB (ref 11.5–15.5)
WBC: 8.3 10*3/uL (ref 4.0–10.5)

## 2016-07-25 LAB — COMPREHENSIVE METABOLIC PANEL
ALBUMIN: 2.9 g/dL — AB (ref 3.5–5.0)
ALK PHOS: 80 U/L (ref 38–126)
ALT: 17 U/L (ref 17–63)
ANION GAP: 8 (ref 5–15)
AST: 22 U/L (ref 15–41)
BUN: 35 mg/dL — ABNORMAL HIGH (ref 6–20)
CALCIUM: 8.9 mg/dL (ref 8.9–10.3)
CO2: 20 mmol/L — AB (ref 22–32)
Chloride: 105 mmol/L (ref 101–111)
Creatinine, Ser: 2.38 mg/dL — ABNORMAL HIGH (ref 0.61–1.24)
GFR calc non Af Amer: 28 mL/min — ABNORMAL LOW (ref >60)
GFR, EST AFRICAN AMERICAN: 33 mL/min — AB (ref >60)
GLUCOSE: 126 mg/dL — AB (ref 65–99)
POTASSIUM: 4.7 mmol/L (ref 3.5–5.1)
SODIUM: 133 mmol/L — AB (ref 135–145)
Total Bilirubin: 2.8 mg/dL — ABNORMAL HIGH (ref 0.3–1.2)
Total Protein: 6.8 g/dL (ref 6.5–8.1)

## 2016-07-25 LAB — PROTEIN, BODY FLUID: Total protein, fluid: <3 g/dL

## 2016-07-25 LAB — LACTATE DEHYDROGENASE, PLEURAL OR PERITONEAL FLUID: LD FL: 32 U/L — AB (ref 3–23)

## 2016-07-25 LAB — LIPASE, BLOOD: LIPASE: 34 U/L (ref 11–51)

## 2016-07-25 LAB — LACTATE DEHYDROGENASE: LDH: 215 U/L — ABNORMAL HIGH (ref 98–192)

## 2016-07-25 LAB — ALBUMIN, FLUID (OTHER)

## 2016-07-25 LAB — GLUCOSE, PERITONEAL FLUID: GLUCOSE, PERITONEAL FLUID: 117 mg/dL

## 2016-07-25 LAB — ETHANOL: Alcohol, Ethyl (B): <5 mg/dL (ref <5)

## 2016-07-25 LAB — BRAIN NATRIURETIC PEPTIDE: B NATRIURETIC PEPTIDE 5: 56.4 pg/mL (ref 0.0–100.0)

## 2016-07-25 MED ORDER — ALBUMIN HUMAN 25 % IV SOLN
75.0000 g | Freq: Once | INTRAVENOUS | Status: DC
  Filled 2016-07-25: qty 300

## 2016-07-25 MED ORDER — ACETAMINOPHEN 650 MG RE SUPP: 650.0000 mg | Freq: Four times a day (QID) | RECTAL | Status: DC | PRN

## 2016-07-25 MED ORDER — ALBUMIN HUMAN 25 % IV SOLN
25.0000 g | Freq: Once | INTRAVENOUS | Status: AC
  Administered 2016-07-25: 25 g via INTRAVENOUS
  Filled 2016-07-25: qty 100

## 2016-07-25 MED ORDER — FERROUS SULFATE 325 (65 FE) MG PO TABS
325.0000 mg | ORAL_TABLET | Freq: Two times a day (BID) | ORAL | Status: DC
  Administered 2016-07-26 – 2016-07-30 (×7): 325 mg via ORAL
  Filled 2016-07-25 (×8): qty 1

## 2016-07-25 MED ORDER — ERYTHROMYCIN 5 MG/GM OP OINT
TOPICAL_OINTMENT | Freq: Every day | OPHTHALMIC | Status: DC
  Administered 2016-07-26 – 2016-07-29 (×4): via OPHTHALMIC
  Filled 2016-07-25: qty 3.5

## 2016-07-25 MED ORDER — ALBUMIN HUMAN 25 % IV SOLN
50.0000 g | Freq: Once | INTRAVENOUS | Status: AC
  Administered 2016-07-25: 50 g via INTRAVENOUS
  Filled 2016-07-25: qty 200

## 2016-07-25 MED ORDER — ONDANSETRON HCL 4 MG/2ML IJ SOLN: 4.0000 mg | Freq: Four times a day (QID) | INTRAMUSCULAR | Status: DC | PRN

## 2016-07-25 MED ORDER — ERYTHROMYCIN 5 MG/GM OP OINT
1.0000 "application " | TOPICAL_OINTMENT | Freq: Once | OPHTHALMIC | Status: AC
  Administered 2016-07-25: 1 via OPHTHALMIC
  Filled 2016-07-25: qty 3.5

## 2016-07-25 MED ORDER — LACTULOSE 10 GM/15ML PO SOLN
20.0000 g | Freq: Three times a day (TID) | ORAL | Status: DC
  Administered 2016-07-26 (×5): 20 g via ORAL
  Filled 2016-07-25 (×9): qty 30

## 2016-07-25 MED ORDER — HYDROXYZINE HCL 25 MG PO TABS: 25.0000 mg | ORAL_TABLET | Freq: Every evening | ORAL | Status: DC | PRN

## 2016-07-25 MED ORDER — LIDOCAINE HCL (PF) 1 % IJ SOLN
5.0000 mL | Freq: Once | INTRAMUSCULAR | Status: AC
  Administered 2016-07-25: 5 mL via INTRADERMAL
  Filled 2016-07-25: qty 5

## 2016-07-25 MED ORDER — ONDANSETRON HCL 4 MG PO TABS: 4.0000 mg | ORAL_TABLET | Freq: Four times a day (QID) | ORAL | Status: DC | PRN

## 2016-07-25 MED ORDER — OXYCODONE HCL 5 MG PO TABS
2.5000 mg | ORAL_TABLET | ORAL | Status: DC | PRN
  Administered 2016-07-27: 2.5 mg via ORAL
  Filled 2016-07-25: qty 1

## 2016-07-25 MED ORDER — ACETAMINOPHEN 325 MG PO TABS: 650.0000 mg | ORAL_TABLET | Freq: Four times a day (QID) | ORAL | Status: DC | PRN

## 2016-07-25 NOTE — H&P (Signed)
History and Physical  Patient Name: Devin Noble     ZOX:[id number]    DOB: 09/27/1956    DOA: 07/25/2016 PCP: No PCP Per Patient   Patient coming from: Home  Chief Complaint: Belly swelling, dyspnea  HPI: Devin Noble is a 60 y.o. male with a past medical history significant for alcoholic cirrhosis, refractory ascites, and iron def anemia who presents with dyspnea and swelling.  The patient's last paracentesis was on 10/18 for 8L max removed, albumin given, cell count negative.  Since then, he has reaccumulated weight followed by worsening dyspnea from abdominal swelling.  Today, the dyspnea was severe, so he came to the ER.  On my exam the patient is confused, but per nursing, he was oriented and coherent on arrival and also reported a fall 4 days ago, bruising his right eye and scraping his elbows.  He denied fever, chills, abdominal pain (other than from distension), hematochezia, melena, vomiting, vomiting blood, or increased confusion.  ED course: -Afebrile, heart rate 110s, respirations 20s, blood pressure 130/85, pulse oximetry normal -Na 133, K 4.7, Cr 2.38 (baseline 1.0-1.2 in July), WBC 8.3 K, Hgb 7.3 and microcytic, stable from September -Alcohol, lipase, BNP normal -Total bilirubin 2.8 -9L were removed by paracentesis and albumin was started; the patient reported improvement in his breathing after paracentesis and TRH were asked to evaluate for admission for AKI    Cirrhosis history: -Patient diagnosed by GI because of swelling in March 2016 referred from [facility] -No GI follow-up until January 2017 when he was admitted for ascites and pneumonia -Admitted April with ascites, started on high-dose diuretics -Admitted weeks later in April 2017 for hypotension, diuretics stopped -Diuretics adjusted by GI as outpatient and monitored with periodic paracentesis -July 2017, readmitted for orthostasis -Twice monthly paracentesis since July -Weekly paracentesis in October -Has  had referral to [facility] liver clinic for eval for transplant, tells me today that he missed a deadline for some payment this week     ROS: Review of Systems  Constitutional: Negative for chills and fever.  Respiratory: Positive for shortness of breath.   Gastrointestinal: Negative for abdominal pain, blood in stool, melena, nausea and vomiting.  Neurological: Positive for dizziness.  All other systems reviewed and are negative.         Past Medical History:  Diagnosis Date  . Abdominal hernia   . Arthritis   . Ascites   . Birthmark of skin    "right eye" (01/30/2013)  . Chronic hyponatremia   . Cirrhosis (HCC)   . Depression   . Esophageal varices (HCC)   . GERD (gastroesophageal reflux disease)    seldom  . Hypertension    takes Lisinopril/HCTZ daily  . Orthostatic hypotension   . Pneumonia 2017  . Restless leg syndrome   . Shortness of breath dyspnea    "when I have toomuch fluid in my belly"    Past Surgical History:  Procedure Laterality Date  . COLONOSCOPY  ~ 2009  . ESOPHAGOGASTRODUODENOSCOPY N/A 03/31/2016   Procedure: ESOPHAGOGASTRODUODENOSCOPY (EGD);  Surgeon: Hilarie Fredrickson, MD;  Location: [facility] ENDOSCOPY;  Service: Endoscopy;  Laterality: N/A;  . TOTAL SHOULDER ARTHROPLASTY Left 01/30/2013   Procedure: TOTAL SHOULDER ARTHROPLASTY;  Surgeon: Mable Paris, MD;  Location: [facility] OR;  Service: Orthopedics;  Laterality: Left;  . TOTAL SHOULDER ARTHROPLASTY Right 05/24/2013   Dr Ave Filter  . TOTAL SHOULDER ARTHROPLASTY Right 05/24/2013    Social History: Patient lives alone with his dog.  The patient walks  unassisted.  He was a Human resources officerbus mechanic.  He stopped drinking in 2017, Feb.    Allergies  Allergen Reactions  . Tramadol Hives and Itching    Family history: family history includes Gallbladder disease in his mother.  Prior to Admission medications   Medication Sig Start Date End Date Taking? Authorizing Provider  ferrous sulfate 325 (65 FE) MG tablet Take 325 mg  by mouth daily with breakfast.   Yes Historical Provider, MD  furosemide (LASIX) 20 MG tablet TAKE 3 TABLETS (60 MG TOTAL) BY MOUTH DAILY. 07/05/16  Yes Hilarie FredricksonJohn N Antone, MD  lactulose (CHRONULAC) 10 GM/15ML solution Take 30 mLs by mouth 4 (four) times daily. 06/07/16  Yes Historical Provider, MD  hydrOXYzine (VISTARIL) 25 MG capsule Take 25 mg by mouth at bedtime as needed for sleep. 06/08/16   Historical Provider, MD  spironolactone (ALDACTONE) 100 MG tablet Take 1 tablet (100 mg total) by mouth daily. Patient not taking: Reported on 07/25/2016 04/21/16   Haydee SalterPhillip M Hobbs, MD       Physical Exam: BP 119/77   Pulse 116   Temp 98.1 F (36.7 C)   Resp 19   SpO2 96%  General appearance: Chronically ill appearing adult male, awake but confused, mildly, in no obvious respiratory distress after paracentesis.   Eyes: Mildly icteric, conjunctiva pink, right eye swollen, mucoid discharge present.  Has dark birth mark on right eyelid. PERRL.    ENT: No nasal deformity, discharge, epistaxis.  Hearing normal. OP with dry MM, no lesions, poor dentition. Neck: No neck masses.  Trachea midline.  No thyromegaly/tenderness. Lymph: No cervical or supraclavicular lymphadenopathy. Skin: Warm and dry.  No jaundice.  Caput medusa noted.  Large scrape on left elbow. Cardiac: Tachtycardic, nl S1-S2, no murmurs appreciated.  Capillary refill is brisk.  3+ pitting LE edema to buttocks.  Radial and DP pulses 2+ and symmetric. Respiratory: Normal respiratory rate and rhythm.  CTAB without rales or wheezes. Abdomen: Abdomen soft.  Minimal nonfocal TTP, without guarding. Still some soft ascites.   MSK: Diffuse loss of muscle mass.  No cyanosis or clubbing. Neuro: PERRL, EOMI.  Hearing normal.  Face symmetric. Speech is fluent.  Muscle strength bilaterally globally 4/5.    Psych: Sensorium intact and responding to questions, but on my exam not able to state yaer or month.  Slow to respond that he is [facility] or  [place].  States year is "2488".  Attention somewhat diminihsed.        Labs on Admission:  I have personally reviewed following labs and imaging studies: CBC:  Recent Labs Lab 07/25/16 1750  WBC 8.3  NEUTROABS 4.6  HGB 7.3*  HCT 23.3*  MCV 75.9*  PLT 194   Basic Metabolic Panel:  Recent Labs Lab 07/25/16 1750  NA 133*  K 4.7  CL 105  CO2 20*  GLUCOSE 126*  BUN 35*  CREATININE 2.38*  CALCIUM 8.9   GFR: CrCl cannot be calculated (Unknown ideal weight.).  Liver Function Tests:  Recent Labs Lab 07/25/16 1750  AST 22  ALT 17  ALKPHOS 80  BILITOT 2.8*  PROT 6.8  ALBUMIN 2.9*    Recent Labs Lab 07/25/16 1750  LIPASE 34   No results for input(s): AMMONIA in the last 168 hours. Coagulation Profile: No results for input(s): INR, PROTIME in the last 168 hours. Cardiac Enzymes: No results for input(s): CKTOTAL, CKMB, CKMBINDEX, TROPONINI in the last 168 hours. BNP (last 3 results) No results for input(s): PROBNP  in the last 8760 hours. HbA1C: No results for input(s): HGBA1C in the last 72 hours. CBG: No results for input(s): GLUCAP in the last 168 hours. Lipid Profile: No results for input(s): CHOL, HDL, LDLCALC, TRIG, CHOLHDL, LDLDIRECT in the last 72 hours. Thyroid Function Tests: No results for input(s): TSH, T4TOTAL, FREET4, T3FREE, THYROIDAB in the last 72 hours. Anemia Panel: No results for input(s): VITAMINB12, FOLATE, FERRITIN, TIBC, IRON, RETICCTPCT in the last 72 hours. Sepsis Labs: Invalid input(s): PROCALCITONIN, LACTICIDVEN No results found for this or any previous visit (from the past 240 hour(s)).           Assessment/Plan  1. AKI :  Mucus membranes dry but clearly hypervolemic, 9L removed but still has ascites.     -Check FeUrea -Will administer albumin (8g per liter removed) -Trend BMP -Check UA -Hold diuretics for now    2. 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  Last drink Feb 2017 per GI notes.     -Consult GI -Hold furosemide and spironolactone for now -Check INR -Follow cell count, diff and body fluid culture -Check serum LDH  3. Iron deficiency anemia:  No report of recent hematochezia or melena.  GI suspects chronic blood loss from gastropathy.  Has had Hgb 7s since September.  Feels better since paracentesis, will hold off on transfusion tonight to re-eval symptoms tomorrow and discuss with Hepatology. -Continue iron -Check ferritin -Type and screen tomorrow  4. Severe protein calorie malnutrition:  -Nutrition eval  5. Fall and Eye bruise:  With mucoid discharge.  Fall from deconditioning in ESLD also likely HE.  Has also has had some orthostasis recurrently this year from diuretics. -Erythromycin ointment, right eye  6. Hepatic encephalopathy:  -Continue lactulose -Check ammonia  7. Hyponatremia:  Hypervolemic.  Actually improved from recent baseline. -Check urine and serum Osm, check urine sodium -Monitor BMP          DVT prophylaxis: SCDs given low Hgb, ?gastropathy  Code Status: DO NOT RESUSCITATE, per patient  Family Communication: None present  Disposition Plan: Anticipate IV albumin, hold diuretics, consult to Gastroenterology. Consults called: None overnight Admission status: INPATIENT, med surg          Medical decision making: Patient seen at 9:35 PM on 07/25/2016.  The patient was discussed with Dr. Jeraldine LootsLockwood.  What exists of the patient's chart was reviewed in depth and summarized above.  Clinical condition: stable from hemodyanmica standpoint for floor bed        Alberteen SamChristopher P Braylan Faul [facility] Pager [phone]        At the time of admission, it appears that the appropriate admission status for this patient is INPATIENT. This is judged to be reasonable and necessary in order to provide the required intensity of service to ensure the patient's safety given the presenting symptoms, physical exam findings, and initial  radiographic and laboratory data in the context of their chronic comorbidities.  Together, these circumstances are felt to place her/him at high risk for further clinical deterioration threatening life, limb, or organ. The following factors support the admission status of inpatient:   A. The patient's presenting symptoms include dyspnea, weakness, fall, abdomen swelling B. The worrisome physical exam findings include ascites, tachycardia, malaise, weakness, confusion C. The initial radiographic and laboratory data are worrisome because of anemia to 7.3 g/dL, hyponatmreia, elevated total bilirubin D. The chronic co-morbidities include end stage liver disease, iron deficiency anemia E. Patient requires inpatient status due to high intensity of service, high  risk for further deterioration and high frequency of surveillance required because of this severe exacerbation of their chronic organ failure. F. I certify that at the point of admission it is my clinical judgment that the patient will require inpatient hospital care spanning beyond 2 midnights from the point of admission and that early discharge would result in unnecessary risk of decompensation and readmission or threat to life, limb or bodily function.

## 2016-07-25 NOTE — ED Triage Notes (Signed)
Patient here with fall 3 days ago and now having increased shortness of breath, increased ascites. Right eye redness and drainage noted, slow to respond to questions, abrasions and bruising to arms. Yellow tint to skin. Dyspnea on arrival

## 2016-07-25 NOTE — ED Notes (Signed)
Dr. Jeraldine LootsLockwood at bedside to end paracentesis.  Total of 9 liters of Peritoneal fluid taken off abd.

## 2016-07-25 NOTE — ED Notes (Addendum)
Admitting MD at bedside. PT has been sleeping at times and was a little confused when the admitting MD came in.  Pt has been alert and oriented since arrival.

## 2016-07-25 NOTE — ED Notes (Signed)
1 liter Peritoneal fluid walked to main lab by [place] Far[place], EMT

## 2016-07-25 NOTE — ED Notes (Addendum)
Pt tolerated paracentesis well.  Yellow colored Peritoneal fluid

## 2016-07-25 NOTE — ED Provider Notes (Signed)
[facility] DEPT Provider Note   CSN: [id number] Arrival date & time: 07/25/16  1729     History   Chief Complaint Chief Complaint  Patient presents with  . Shortness of Breath  . Ascites    HPI Devin Noble is a 60 y.o. male.  HPI Patient presents with concern of abdominal pain, dyspnea, fatigue, nausea, generalized weakness. He has multiple medical issues, including hepatic cirrhosis, and notes that he is currently being evaluated for placement on the transplant list. He is here with multiple family members to assist with the history of present illness. He notes that he typically has paracentesis every week, but has not had this procedure in at least 2 weeks. Roughly over that same timeframe he has had the aforementioned symptoms. No fever, confusion, disorientation. Patient also has secondary concern of new right eye drainage, without vision loss.  Past Medical History:  Diagnosis Date  . Abdominal hernia   . Arthritis   . Ascites   . Birthmark of skin    "right eye" (01/30/2013)  . Chronic hyponatremia   . Cirrhosis (HCC)   . Depression   . Esophageal varices (HCC)   . GERD (gastroesophageal reflux disease)    seldom  . Hypertension    takes Lisinopril/HCTZ daily  . Orthostatic hypotension   . Pneumonia 2017  . Restless leg syndrome   . Shortness of breath dyspnea    "when I have toomuch fluid in my belly"    Patient Active Problem List   Diagnosis Date Noted  . Dehydration 04/20/2016  . AKI (acute kidney injury) (HCC) 04/20/2016  . Secondary esophageal varices without bleeding (HCC)   . Orthostatic hypotension 01/21/2016  . Fall 01/21/2016  . Hyponatremia 01/21/2016  . History of alcohol abuse - quit Jan 2017 01/21/2016  . Intravascular volume depletion 01/21/2016  . Alcoholic cirrhosis of liver with ascites (HCC) 11/10/2015  . Anemia, iron deficiency 10/24/2015  . Iron deficiency anemia due to chronic blood loss 10/02/2015  . Protein-calorie  malnutrition, severe 10/01/2015  . Rectal bleeding 12/26/2014  . AP (abdominal pain) 12/26/2014  . Normocytic anemia 12/26/2014    Past Surgical History:  Procedure Laterality Date  . COLONOSCOPY  ~ 2009  . ESOPHAGOGASTRODUODENOSCOPY N/A 03/31/2016   Procedure: ESOPHAGOGASTRODUODENOSCOPY (EGD);  Surgeon: Hilarie FredricksonJohn N Ardith, MD;  [facility];  Service: Endoscopy;  Laterality: N/A;  . TOTAL SHOULDER ARTHROPLASTY Left 01/30/2013   Procedure: TOTAL SHOULDER ARTHROPLASTY;  Surgeon: Mable ParisJustin William Chandler, MD;  [facility];  Service: Orthopedics;  Laterality: Left;  . TOTAL SHOULDER ARTHROPLASTY Right 05/24/2013   Dr Ave Filterhandler  . TOTAL SHOULDER ARTHROPLASTY Right 05/24/2013       Home Medications    Prior to Admission medications   Medication Sig Start Date End Date Taking? Authorizing Provider  furosemide (LASIX) 20 MG tablet TAKE 3 TABLETS (60 MG TOTAL) BY MOUTH DAILY. 07/05/16   Hilarie FredricksonJohn N Marland, MD  furosemide (LASIX) 40 MG tablet Take 1 tablet (40 mg total) by mouth daily. 04/21/16   Haydee SalterPhillip M Hobbs, MD  spironolactone (ALDACTONE) 100 MG tablet Take 1 tablet (100 mg total) by mouth daily. 04/21/16   Haydee SalterPhillip M Hobbs, MD    Family History Family History  Problem Relation Age of Onset  . Gallbladder disease Mother   . Colon cancer Neg Hx   . Colon polyps Neg Hx   . Kidney disease Neg Hx   . Diabetes Neg Hx   . Esophageal cancer Neg Hx   .  Heart disease Neg Hx     Social History Social History  Substance Use Topics  . Smoking status: Never Smoker  . Smokeless tobacco: Never Used  . Alcohol use No     Comment: No alcholol since 09/21/15     Allergies   Tramadol   Review of Systems Review of Systems  Eyes: Positive for discharge and redness.  Gastrointestinal: Positive for abdominal distention, abdominal pain and nausea.  Skin: Positive for color change.  Allergic/Immunologic: Positive for immunocompromised state.     Physical Exam Updated Vital Signs BP  113/85   Pulse 118   Temp 98.1 F (36.7 C)   Resp 24   SpO2 99%   Physical Exam  Constitutional: He appears ill. He appears distressed.  Eyes: Right eye exhibits chemosis and discharge. Right conjunctiva is injected. Right eye exhibits normal extraocular motion. Left eye exhibits normal extraocular motion.    Cardiovascular: Tachycardia present.   Pulmonary/Chest: Accessory muscle usage present. Tachypnea noted. He is in respiratory distress. He has decreased breath sounds.  Abdominal: He exhibits distension. There is tenderness. There is guarding.  Skin: Skin is warm and dry.  jaundiced     ED Treatments / Results  Labs (all labs ordered are listed, but only abnormal results are displayed) Labs Reviewed  COMPREHENSIVE METABOLIC PANEL - Abnormal; Notable for the following:       Result Value   Sodium 133 (*)    CO2 20 (*)    Glucose, Bld 126 (*)    BUN 35 (*)    Creatinine, Ser 2.38 (*)    Albumin 2.9 (*)    Total Bilirubin 2.8 (*)    GFR calc non Af Amer 28 (*)    GFR calc Af Amer 33 (*)    All other components within normal limits  CBC WITH DIFFERENTIAL/PLATELET - Abnormal; Notable for the following:    RBC 3.07 (*)    Hemoglobin 7.3 (*)    HCT 23.3 (*)    MCV 75.9 (*)    MCH 23.8 (*)    RDW 21.7 (*)    Monocytes Absolute 2.1 (*)    All other components within normal limits  ETHANOL  LIPASE, BLOOD  BRAIN NATRIURETIC PEPTIDE  URINALYSIS, ROUTINE W REFLEX MICROSCOPIC (NOT AT [facility])  LACTATE DEHYDROGENASE, BODY FLUID  GLUCOSE, PERITONEAL FLUID  PROTEIN, BODY FLUID  ALBUMIN, FLUID   Initial labs notable for acute kidney injury. I discussed patient's presentation with our interventional radiology team. No notes available to perform ultrasound-guided paracentesis. Subsequent discussed indication for this procedure with patient and his wife. He agreed, consented to the procedure.    Radiology No results found.  Procedures .Paracentesis Date/Time:  07/25/2016 9:10 PM Performed by: Gerhard Munch Authorized by: Gerhard Munch   Consent:    Consent obtained:  Verbal   Consent given by:  Patient   Risks discussed:  Infection, pain and bleeding   Alternatives discussed:  No treatment, delayed treatment and observation Pre-procedure details:    Procedure purpose:  Therapeutic   Preparation: Patient was prepped and draped in usual sterile fashion   Anesthesia (see MAR for exact dosages):    Anesthesia method:  Local infiltration   Local anesthetic:  Lidocaine 1% w/o epi Procedure details:    Needle gauge:  18   Ultrasound guidance: yes     Puncture site:  R lower quadrant   Fluid removed amount:  9l   Fluid appearance:  Yellow   Dressing:  4x4 sterile gauze  Post-procedure details:    Patient tolerance of procedure:  Tolerated well, no immediate complications   (including critical care time)    Medications Ordered in ED Medications  albumin human 25 % solution 75 g (not administered)  erythromycin ophthalmic ointment 1 application (1 application Right Eye Given 07/25/16 1927)  lidocaine (PF) (XYLOCAINE) 1 % injection 5 mL (5 mLs Intradermal Given 07/25/16 1927)  albumin human 25 % solution 25 g (25 g Intravenous New Bag/Given 07/25/16 2055)     Initial Impression / Assessment and Plan / ED Course  I have reviewed the triage vital signs and the nursing notes.  Pertinent labs & imaging results that were available during my care of the patient were reviewed by me and considered in my medical decision making (see chart for details).  Clinical Course    8:50 PM Now 9l out from pericentesis.  Final Clinical Impressions(s) / ED Diagnoses  Patient with multiple medical issues including hepatic cirrhosis, recurrent ascites presents with dyspnea, fatigue, weakness. Patient is awake and alert, but has respiratory distress, tachypnea, shallow breathing. Patient acknowledges no recent paracentesis. I performed paracentesis,  without complication, removed 9 L of fluid. Patient received albumin. However, the patient was found to have acute kidney injury, and given his substantial medical issues, he required admission for further evaluation and management.   Gerhard Munchobert Jawuan Robb, MD 07/25/16 2112

## 2016-07-25 NOTE — ED Notes (Signed)
Attempted report 

## 2016-07-25 NOTE — ED Notes (Signed)
Bedside paracentesis by Dr. Jeraldine LootsLockwood.

## 2016-07-25 NOTE — ED Notes (Addendum)
Dr.Lockwood at bedside  

## 2016-07-26 ENCOUNTER — Encounter (HOSPITAL_COMMUNITY): Payer: Self-pay | Admitting: General Practice

## 2016-07-26 ENCOUNTER — Inpatient Hospital Stay (HOSPITAL_COMMUNITY): Payer: Medicare Other

## 2016-07-26 LAB — GRAM STAIN

## 2016-07-26 LAB — AMMONIA: Ammonia: 73 umol/L — ABNORMAL HIGH (ref 9–35)

## 2016-07-26 LAB — CBC
HEMATOCRIT: 18.6 % — AB (ref 39.0–52.0)
HEMOGLOBIN: 6 g/dL — AB (ref 13.0–17.0)
MCH: 24.6 pg — ABNORMAL LOW (ref 26.0–34.0)
MCHC: 32.3 g/dL (ref 30.0–36.0)
MCV: 76.2 fL — ABNORMAL LOW (ref 78.0–100.0)
Platelets: 120 10*3/uL — ABNORMAL LOW (ref 150–400)
RBC: 2.44 MIL/uL — AB (ref 4.22–5.81)
RDW: 21.6 % — ABNORMAL HIGH (ref 11.5–15.5)
WBC: 3.7 10*3/uL — AB (ref 4.0–10.5)

## 2016-07-26 LAB — COMPREHENSIVE METABOLIC PANEL
ALBUMIN: 3.5 g/dL (ref 3.5–5.0)
ALT: 13 U/L — ABNORMAL LOW (ref 17–63)
ANION GAP: 7 (ref 5–15)
AST: 18 U/L (ref 15–41)
Alkaline Phosphatase: 64 U/L (ref 38–126)
BILIRUBIN TOTAL: 1.8 mg/dL — AB (ref 0.3–1.2)
BUN: 34 mg/dL — ABNORMAL HIGH (ref 6–20)
CO2: 21 mmol/L — ABNORMAL LOW (ref 22–32)
Calcium: 8.6 mg/dL — ABNORMAL LOW (ref 8.9–10.3)
Chloride: 105 mmol/L (ref 101–111)
Creatinine, Ser: 2.28 mg/dL — ABNORMAL HIGH (ref 0.61–1.24)
GFR, EST AFRICAN AMERICAN: 34 mL/min — AB (ref >60)
GFR, EST NON AFRICAN AMERICAN: 30 mL/min — AB (ref >60)
Glucose, Bld: 135 mg/dL — ABNORMAL HIGH (ref 65–99)
POTASSIUM: 4.3 mmol/L (ref 3.5–5.1)
Sodium: 133 mmol/L — ABNORMAL LOW (ref 135–145)
TOTAL PROTEIN: 6.3 g/dL — AB (ref 6.5–8.1)

## 2016-07-26 LAB — PROTIME-INR
INR: 2.34
PROTHROMBIN TIME: 26 s — AB (ref 11.4–15.2)

## 2016-07-26 LAB — BODY FLUID CELL COUNT WITH DIFFERENTIAL
LYMPHS FL: 4 %
Monocyte-Macrophage-Serous Fluid: 46 % — ABNORMAL LOW (ref 50–90)
NEUTROPHIL FLUID: 50 % — AB (ref 0–25)
WBC FLUID: 423 uL (ref 0–1000)

## 2016-07-26 LAB — CREATININE, URINE, RANDOM: Creatinine, Urine: 151.44 mg/dL

## 2016-07-26 LAB — FERRITIN: FERRITIN: 29 ng/mL (ref 24–336)

## 2016-07-26 LAB — PATHOLOGIST SMEAR REVIEW

## 2016-07-26 LAB — OSMOLALITY: Osmolality: 289 mOsm/kg (ref 275–295)

## 2016-07-26 LAB — SODIUM, URINE, RANDOM

## 2016-07-26 LAB — PREPARE RBC (CROSSMATCH)

## 2016-07-26 MED ORDER — ENSURE ENLIVE PO LIQD
237.0000 mL | Freq: Two times a day (BID) | ORAL | Status: DC
  Administered 2016-07-26 – 2016-07-30 (×4): 237 mL via ORAL

## 2016-07-26 MED ORDER — CEFTRIAXONE SODIUM 2 G IJ SOLR
2.0000 g | INTRAMUSCULAR | Status: DC
  Administered 2016-07-27 – 2016-07-29 (×3): 2 g via INTRAVENOUS
  Filled 2016-07-26 (×4): qty 2

## 2016-07-26 MED ORDER — ALBUMIN HUMAN 25 % IV SOLN
100.0000 g | Freq: Once | INTRAVENOUS | Status: DC
  Filled 2016-07-26: qty 400

## 2016-07-26 MED ORDER — ALBUMIN HUMAN 25 % IV SOLN
150.0000 g | Freq: Once | INTRAVENOUS | Status: AC
  Administered 2016-07-26: 150 g via INTRAVENOUS
  Filled 2016-07-26: qty 600

## 2016-07-26 MED ORDER — SODIUM CHLORIDE 0.9 % IV SOLN
Freq: Once | INTRAVENOUS | Status: AC
  Administered 2016-07-26: 04:00:00 via INTRAVENOUS

## 2016-07-26 MED ORDER — DEXTROSE 5 % IV SOLN
2.0000 g | INTRAVENOUS | Status: DC
  Filled 2016-07-26: qty 2

## 2016-07-26 NOTE — Consult Note (Signed)
[facility] Consult: 11:56 AM 07/26/2016  LOS: 1 day    Referring Provider: Dr Alvino Chapel  Primary Care Physician:  No PCP Per Patient Primary Gastroenterologist:  Dr. Marina Goodell     Reason for Consultation:  Management of ascites.   HPI: Devin Noble is a 60 y.o. male.  PMH Iron deficiency anemia.  Protein calorie malnutrition.  Hyponatremia.  Coagulopathy.  s/P bilateral shoulder replacements. Decompensated alcoholic cirrhosis. History of massive, refractory ascites requiring paracentesis. This dates back to April 2017 with tap volumes including 10 L, 8 L. fluid was negative for SBP.  In April he was prescribed Lasix 80, Aldactone 200 mg daily.  However he developed orthostatic hypotension and Lasix/Aldactone were adjusted to 60/100. He's been receiving paracentesis approximately twice a month since 03/2016, and weekly taps throughout the month of October. generally about 8 L is removed each time.  Tap on 07/14/16 with removal of 8 L and post-tap albumin infusion. Cell count negative for SBP. Current diuretic regimen is Lasix 60/Aldactone 100 mg daily. On 07/13/16 patients renal function was within normal limits at 17/1.0. Patient also is on lactulose, his ammonia level in 10/2015 measured 125. Patient underwent EGD 03/31/2016 for variceal screening and history of anemia.. He had portal gastropathy and grade 2 esophageal varices.   Apparently had colonoscopy around 2009.  Unable to locate the report, but there is no mention in the history of colon polyps or diverticulosis. He is not on beta blocker therapy. Patient has been sober/abstinence since 09/2015. He was supposed to meet with [facility] liver clinic for transplant evaluation  At GI office visit of 06/03/16 it was suggested that he should be hospitalized. However the  patient refused because he couldn't afford the hospitalization, he had been hospitlized twice in recent months and he just wanted to wait and see what the transplant team at [facility] advised.  When the PA-C left the room to discuss the situation with Dr. Marina Goodell, the patient left the office AMA.  He has not seen Dr. Marina Goodell or anyone at the GI office since that date. However telephone orders for paracentesis have been forwarded by Dr. Marina Goodell and his staff.    He presented to the emergency room complaining of increased abdominal distention and dyspnea. Admitting physician felt the patient was confused. Patient had a fall and sustained a bruise to the right eye and scraped his elbows about 4 days PTA.  Other than the discomfort from the distention he is not having abdominal pain. No fever, chills, hematochezia, melena, emesis or previous altered mental status or increased somnolence. He underwent 8 L paracentesis yesterday, a total of 75 g of albumin were infused post-tap.  Current ammonia level is 73.  Sodium level is 133, this is actually improved for him as it was 123 in July.  His BUN and creatinine at arrival were 35/2.3. These have minimally improved overnight. PT 26/INR 2.3 which is elevated compared to 20/1.7 in 12/2015.   Hgb since 04/19/16: 8.8 (7/24) >> 7.4 (9/7) >> 7.4 (10/17) >> 7.3 (10/29) >> 6.0 (10/30) T  bili is 1.8, minimally elevated, otherwise LFTs are not elevated.  The patient has a hard time remembering medical details and is confused today, but not obtunded. It sounds like he's not taking the lactulose consistently as prescribed, but does have 2-4 bowel movements a day. Hasn't had any blood per rectum or melenic stools. No nausea or vomiting. He has had falls at least a couple of times within the last 4-6 weeks. The last fall occurred about a week ago and he sustained a hematoma to the region of the right eye and a abrasion to the left elbow. He reports that he bleeds easily from trauma.  Even with  the 9 L tap yesterday, his belly is feeling tight again today. His breathing overall remains somewhat improved, but he is coughing a lot and still feels dyspneic.  Patient stopped driving about a week ago because of weakness. He denies excessive somnolence. His major support is a girlfriend who is health is apparently not that great.  Doesn't use NSAIDs Patient remains abstinent from alcohol. He never was a cigarette smoker. He hasn't had any lower extremity edema.    Past Medical History:  Diagnosis Date  . Abdominal hernia   . Arthritis   . Ascites   . Birthmark of skin    "right eye" (01/30/2013)  . Chronic hyponatremia   . Cirrhosis (HCC)   . Depression   . Esophageal varices (HCC)   . GERD (gastroesophageal reflux disease)    seldom  . Hypertension    takes Lisinopril/HCTZ daily  . Orthostatic hypotension   . Pneumonia 2017  . Restless leg syndrome   . Shortness of breath dyspnea    "when I have toomuch fluid in my belly"    Past Surgical History:  Procedure Laterality Date  . COLONOSCOPY  ~ 2009  . ESOPHAGOGASTRODUODENOSCOPY N/A 03/31/2016   Procedure: ESOPHAGOGASTRODUODENOSCOPY (EGD);  Surgeon: Hilarie Fredrickson, MD;  Location: [facility] ENDOSCOPY;  Service: Endoscopy;  Laterality: N/A;  . TOTAL SHOULDER ARTHROPLASTY Left 01/30/2013   Procedure: TOTAL SHOULDER ARTHROPLASTY;  Surgeon: Mable Paris, MD;  [facility] OR;  Service: Orthopedics;  Laterality: Left;  . TOTAL SHOULDER ARTHROPLASTY Right 05/24/2013   Dr Ave Filter  . TOTAL SHOULDER ARTHROPLASTY Right 05/24/2013    Prior to Admission medications   Medication Sig Start Date End Date Taking? Authorizing Provider  ferrous sulfate 325 (65 FE) MG tablet Take 325 mg by mouth daily with breakfast.   Yes Historical Provider, MD  furosemide (LASIX) 20 MG tablet TAKE 3 TABLETS (60 MG TOTAL) BY MOUTH DAILY. 07/05/16  Yes Hilarie Fredrickson, MD  lactulose (CHRONULAC) 10 GM/15ML solution Take 30 mLs by mouth 4 (four) times daily. 06/07/16   Yes Historical Provider, MD  hydrOXYzine (VISTARIL) 25 MG capsule Take 25 mg by mouth at bedtime as needed for sleep. 06/08/16   Historical Provider, MD  spironolactone (ALDACTONE) 100 MG tablet Take 1 tablet (100 mg total) by mouth daily. Patient not taking: Reported on 07/25/2016 04/21/16   Haydee Salter, MD    Scheduled Meds: . erythromycin   Right Eye QHS  . feeding supplement (ENSURE ENLIVE)  237 mL Oral BID BM  . ferrous sulfate  325 mg Oral BID WC  . lactulose  20 g Oral TID AC & HS   Infusions:   PRN Meds: acetaminophen **OR** acetaminophen, hydrOXYzine, ondansetron **OR** ondansetron (ZOFRAN) IV, oxyCODONE   Allergies as of 07/25/2016 - Review Complete 07/25/2016  Allergen Reaction Noted  . Tramadol Hives  and Itching 05/24/2013    Family History  Problem Relation Age of Onset  . Gallbladder disease Mother   . Colon cancer Neg Hx   . Colon polyps Neg Hx   . Kidney disease Neg Hx   . Diabetes Neg Hx   . Esophageal cancer Neg Hx   . Heart disease Neg Hx     Social History   Social History  . Marital status: Single    Spouse name: N/A  . Number of children: 0  . Years of education: N/A   Occupational History  . Diability    Social History Main Topics  . Smoking status: Never Smoker  . Smokeless tobacco: Never Used  . Alcohol use No     Comment: No alcholol since 09/21/15  . Drug use: No  . Sexual activity: Not Currently   Other Topics Concern  . Not on file   Social History Narrative   Worked previously as a Emergency planning/management officerschool bus mechanic. He was forced into retirement/fired. When he was unable to perform his work because of shoulder issues.   His social support system includes a 5270 something-year-old friend who does not drive and her children, most of whom work during the day. However one of these children is caring for the patient's dog while he is in the hospital.( Dated 09/2015)    REVIEW OF SYSTEMS: Constitutional:  Weakness ENT:  No nose bleeds Pulm:   Dyspnea and cough which is mostly nonproductive. CV:  No palpitations, no LE edema.  No chest pain GU:  No hematuria, no frequency GI:  See HPI Heme:  See HPI   Transfusions:  He was transfused with PRBC 2 in 09/2015. Neuro:  No headaches, no peripheral tingling or numbness Derm:  No itching, no rash or sores.  Endocrine:  No sweats or chills.  No polyuria or dysuria Immunization:  I did not inquire as to current flu vaccination status. Travel:  None beyond local counties in last few months.    PHYSICAL EXAM: Vital signs in last 24 hours: Vitals:   07/26/16 0850 07/26/16 1120  BP: 102/62 105/68  Pulse: (!) 110 (!) 113  Resp: 18 17  Temp: 98.6 F (37 C) 98.9 F (37.2 C)   Wt Readings from Last 3 Encounters:  07/25/16 98.1 kg (216 lb 4.3 oz)  06/03/16 90.3 kg (199 lb)  04/20/16 79.1 kg (174 lb 6.4 oz)    General: Chronically, acutely looking ill and frail WM, he looks significantly older than his stated age. Head:  There is swelling and bruising to the area lateral to the right arm. This is causing some ectropion and some redness of the conjunctiva/sclera.  Eyes:  Trauma near the right eye as above. Ears:  Patient is very slow to answer, but his hearing seems non-diminished.  Nose:  No congestion or discharge. Mouth:  MM moist. Tongue is somewhat coated in a nonspecific fashion, does not look like used. Tongue is midline. No ulcers.  Poor dental health. Many missing teeth and general dental disease. Neck:  No mass, no JVD. Lungs:  Pronounced cough which is tight and appears/sounds nonproductive. He is dyspneic. Lung sounds are diminished overall. No crackles, rhonchi or rales. Heart: RRR. No MRG. Abdomen:  Distended, mildly to moderately tense.  Nontender. Gauze bandage covering the recent tap site in the right abdomen, the bandage is slightly wet with yellowish, ascitic discoloration.   Rectal: Deferred   Musc/Skeltl: Generally, the upper extremities look thin and lacking in  muscular artery.  Due to previous bilateral shoulder replacements. He is unable to raise and straighten his arms completely. Limb strength was not tested. Extremities:  No CCE.  Extensive purpura on the arms.  Neurologic:  Patient is alert, oriented to self and place. He is unable to name the date or year.  Asterixis present. No resting tremor. Moves all 4 limbs, strength not tested. Skin:  Abrasion with heaped up clotted blood on the left elbow.   Psych:  Affect depressed. All.  Intake/Output from previous day: 10/29 0701 - 10/30 0700 In: 100 [IV Piggyback:100] Out: 9000  Intake/Output this shift: Total I/O In: 735 [I.V.:400; Blood:335] Out: 300 [Urine:300]  LAB RESULTS:  Recent Labs  07/25/16 1750 07/26/16 0102  WBC 8.3 3.7*  HGB 7.3* 6.0*  HCT 23.3* 18.6*  PLT 194 120*   BMET Lab Results  Component Value Date   NA 133 (L) 07/26/2016   NA 133 (L) 07/25/2016   NA 128 (L) 07/13/2016   K 4.3 07/26/2016   K 4.7 07/25/2016   K 4.9 07/13/2016   CL 105 07/26/2016   CL 105 07/25/2016   CL 102 07/13/2016   CO2 21 (L) 07/26/2016   CO2 20 (L) 07/25/2016   CO2 19 07/13/2016   GLUCOSE 135 (H) 07/26/2016   GLUCOSE 126 (H) 07/25/2016   GLUCOSE 163 (H) 07/13/2016   BUN 34 (H) 07/26/2016   BUN 35 (H) 07/25/2016   BUN 17 07/13/2016   CREATININE 2.28 (H) 07/26/2016   CREATININE 2.38 (H) 07/25/2016   CREATININE 1.08 07/13/2016   CALCIUM 8.6 (L) 07/26/2016   CALCIUM 8.9 07/25/2016   CALCIUM 8.5 07/13/2016   LFT  Recent Labs  07/25/16 1750 07/26/16 0102  PROT 6.8 6.3*  ALBUMIN 2.9* 3.5  AST 22 18  ALT 17 13*  ALKPHOS 80 64  BILITOT 2.8* 1.8*   PT/INR Lab Results  Component Value Date   INR 2.34 07/26/2016   INR 1.79 (H) 01/22/2016   INR 1.9 (H) 01/21/2016   Hepatitis Panel No results for input(s): HEPBSAG, HCVAB, HEPAIGM, HEPBIGM in the last 72 hours. C-Diff No components found for: CDIFF Lipase     Component Value Date/Time   LIPASE 34 07/25/2016 1750     Drugs of Abuse  No results found for: LABOPIA, COCAINSCRNUR, LABBENZ, AMPHETMU, THCU, LABBARB   RADIOLOGY STUDIES: No results found.    IMPRESSION:   *  Decompensated alcoholic cirrhosis.  Meld -Na of 28 in 05/2016.  Current MELD-Na is 31.  [facility] that is for this week.    *  Refractory ascites requiring large volume paracentesis. Has never had evidence for SBP.  Diuretic dosing has been challenging due to AKI.  Within 24 hours of recent large-volume paracentesis, he is already significantly reaccumulated fluid. ? Time to consider a TIPS.    *  AKI.  Diuretics on hold.   *  Coagulopathy.  This is worse  *  Hyponatremia, non-critical  *  AMS, hepatic encephalopathy. Home meds include lactulose , And he has anywhere from 2-4 BMs per day, but not clear he is taking the lactulose as prescribed.  *  Recent falls. Most recent occurred about a week ago, was associated with brief syncope. Sustained abrasion to the left elbow area and hematoma near the right eye.  *  Iron deficiency anemia. Hemoglobin has been dropping steadily within the last few months. Indices still microcytic.  Not clear that he's been taking  iron twice a day as prescribed. EGD 03/2016 showed grade 2 varices and portal gastropathy. Fecal occult blood testing has not been performed in the labs since 12/2014.  I wonder if the recent fall and associated hematoma is contributing to his anemia.  *  Thrombocytopenia. This is a new finding, the only other time his platelets were reduced was in 09/2015 when they reached 146.  *  Protein calorie malnutrition.    PLAN:     *  For the HE, we will add rifaximin. Though he's probably not going to be able to afford this once he is an outpatient.  *  Question consider TIPS?    *  Question repeat the EGD considering the drop in hemoglobin.   Jennye Moccasin  07/26/2016, 11:56 AM Pager: [phone]     Attending physician's  note   I have taken a history, examined the patient and reviewed the chart. I agree with the Advanced Practitioner's note, impression and recommendations.  Decompensated alcoholic cirrhosis with refractory ascites and AKI. Current MELD-Na is 31. TIPS an option however HE will worsen and given MELD-Na would consider deferring this decision to [facility]. Hold diuretics until AKI improved. Call Nephrology consult.  Iron deficiency anemia likely due to portal gastropathy noted on EGD in 03/2016. No plans to repeat EGD at this time. Consider IV Fe infusion in hospital-defer hospitalist.  HE continue lactulose and add rifaximin.    Claudette Head, MD Clementeen Graham [phone] Mon-Fri 8a-5p [phone] after 5p, weekends, holidays

## 2016-07-26 NOTE — Progress Notes (Signed)
Initial Nutrition Assessment  DOCUMENTATION CODES:   Severe malnutrition in context of chronic illness  INTERVENTION:  Continue Ensure Enlive po BID, each supplement provides 350 kcal and 20 grams of protein.  Encourage adequate PO intake.   NUTRITION DIAGNOSIS:   Malnutrition related to chronic illness as evidenced by energy intake < or equal to 75% for > or equal to 1 month, severe depletion of body fat, severe depletion of muscle mass.  GOAL:   Patient will meet greater than or equal to 90% of their needs  MONITOR:   PO intake, Supplement acceptance, Labs, Weight trends, Skin, I & O's  REASON FOR ASSESSMENT:   Consult Assessment of nutrition requirement/status  ASSESSMENT:   60 y.o. male with a past medical history significant for alcoholic cirrhosis, refractory ascites, and iron def anemia who presents with dyspnea and swelling. The patient's last paracentesis was on 10/18 for 8L max removed, albumin given, cell count negative. Since then, he has reaccumulated weight followed by worsening dyspnea from abdominal swelling. His dyspnea was severe, so he came to the ER. In the ED, 9L was removed by paracentesis and albumin started.   Meal completion 100% this AM. Pt reports having a decreased appetite which has been ongoing over the past 2-3 months. Pt reports usual intake of 1 meal a day which mostly consists of a sandwich. Weight has been fluctuating per weight records due to fluid. Pt currently has Ensure ordered and reports liking them. RD to continue with current orders. Pt educated on the importance of adequate protein intake.   Nutrition-Focused physical exam completed. Findings are severe fat depletion, severe muscle depletion, and moderate edema.   Labs and medications reviewed.   Diet Order:  Diet regular Room service appropriate? Yes; Fluid consistency: Thin  Skin:   (Abrasion on R arm, weeping on R foot)  Last BM:  10/29  Height:   Ht Readings from Last 1  Encounters:  07/25/16 5\' 11"  (1.803 m)    Weight:    Wt Readings from Last 1 Encounters:  07/25/16 216 lb 4.3 oz (98.1 kg)    Ideal Body Weight:  78 kg  BMI:  Body mass index is 30.16 kg/m.  Estimated Nutritional Needs:   Kcal:  2300-2500  Protein:  115-130 grams  Fluid:  Per MD  EDUCATION NEEDS:   Education needs addressed  Roslyn SmilingStephanie Javae Braaten, MS, RD, LDN Pager # [phone] After hours/ weekend pager # [phone]

## 2016-07-26 NOTE — Consult Note (Addendum)
Reason for Consult: Acute kidney injury Referring Physician: Noralee StainJennifer Choi D.O. [facility])  HPI: (This history is obtained entirely from chart review as the patient could not give me any history because of somnolence) 60 year old Caucasian man with past medical history significant for decompensated alcoholic cirrhosis, refractory ascites requiring nearly weekly paracenteses over the past month with post Albumin infusion. He also has associated protein calorie malnutrition, coagulopathy from hepatic dysfunction and iron deficiency anemia. He was on furosemide 60 mg daily and spironolactone 100 mg daily just prior to hospitalization. He presented to the emergency room with increased abdominal distention/dyspnea and had a recent fall with a bruise to his right eye/scraped elbows prior to admission. He consequently was admitted after the emergency room physician deemed him to be confused. A couple months back, he was recommended admission to the hospital however, he refused because of financial burden and left that appointment AMA choosing only to keep appointments for paracentesis. There is no recent reported use of NSAIDs and no recent iodinated intravenous contrast exposure noted on Epic.  There is no report of excessive GI losses with any nausea, vomiting or diarrhea and history is noted of poor adherence with lactulose. He is currently on lactulose for hepatic encephalopathy.  Past Medical History:  Diagnosis Date  . Abdominal hernia   . Arthritis   . Ascites   . Birthmark of skin    "right eye" (01/30/2013)  . Chronic hyponatremia   . Cirrhosis (HCC)   . Depression   . Esophageal varices (HCC)   . GERD (gastroesophageal reflux disease)    seldom  . Hypertension    takes Lisinopril/HCTZ daily  . Orthostatic hypotension   . Pneumonia 2017  . Restless leg syndrome   . Shortness of breath dyspnea    "when I have toomuch fluid in my belly"    Past Surgical History:  Procedure Laterality Date  .  COLONOSCOPY  ~ 2009  . ESOPHAGOGASTRODUODENOSCOPY N/A 03/31/2016   Procedure: ESOPHAGOGASTRODUODENOSCOPY (EGD);  Surgeon: Hilarie FredricksonJohn N Ossiel, MD;  [facility];  Service: Endoscopy;  Laterality: N/A;  . TOTAL SHOULDER ARTHROPLASTY Left 01/30/2013   Procedure: TOTAL SHOULDER ARTHROPLASTY;  Surgeon: Mable ParisJustin William Chandler, MD;  [facility];  Service: Orthopedics;  Laterality: Left;  . TOTAL SHOULDER ARTHROPLASTY Right 05/24/2013   Dr Ave Filterhandler  . TOTAL SHOULDER ARTHROPLASTY Right 05/24/2013    Family History  Problem Relation Age of Onset  . Gallbladder disease Mother   . Colon cancer Neg Hx   . Colon polyps Neg Hx   . Kidney disease Neg Hx   . Diabetes Neg Hx   . Esophageal cancer Neg Hx   . Heart disease Neg Hx     Social History:  reports that he has never smoked. He has never used smokeless tobacco. He reports that he does not drink alcohol or use drugs.  Allergies:  Allergies  Allergen Reactions  . Tramadol Hives and Itching    Medications:  Scheduled: . erythromycin   Right Eye QHS  . feeding supplement (ENSURE ENLIVE)  237 mL Oral BID BM  . ferrous sulfate  325 mg Oral BID WC  . lactulose  20 g Oral TID AC & HS    BMP Latest Ref Rng & Units 07/26/2016 07/25/2016 07/13/2016  Glucose 65 - 99 mg/dL 161(W135(H) 960(A126(H) 540(J163(H)  BUN 6 - 20 mg/dL 81(X34(H) 91(Y35(H) 17  Creatinine 0.61 - 1.24 mg/dL 7.82(N2.28(H) 5.62(Z2.38(H) 3.081.08  Sodium 135 - 145 mmol/L 133(L) 133(L) 128(L)  Potassium 3.5 -  5.1 mmol/L 4.3 4.7 4.9  Chloride 101 - 111 mmol/L 105 105 102  CO2 22 - 32 mmol/L 21(L) 20(L) 19  Calcium 8.9 - 10.3 mg/dL 1.6(X) 8.9 8.5   CBC Latest Ref Rng & Units 07/26/2016 07/25/2016 07/13/2016  WBC 4.0 - 10.5 K/uL 3.7(L) 8.3 4.8  Hemoglobin 13.0 - 17.0 g/dL 0.9(UE) 7.3(L) 7.4(LL)  Hematocrit 39.0 - 52.0 % 18.6(L) 23.3(L) 23.0(LL)  Platelets 150 - 400 K/uL 120(L) 194 258.0     No results found.  Review of Systems  Unable to perform ROS: Patient nonverbal (Somnolent, opens eyes to calling  out his name but immediately falls back to sleep)   Blood pressure 105/68, pulse (!) 113, temperature 98.9 F (37.2 C), temperature source Oral, resp. rate 17, height 5\' 11"  (1.803 m), weight 98.1 kg (216 lb 4.3 oz), SpO2 95 %.  Urine output so far 575 mL Physical Exam  Nursing note and vitals reviewed. Constitutional:  Appears malnourished, unkempt and chronically ill. Somnolent when I came to see him and unable to stay awake long enough for conversation.  HENT:  Head: Normocephalic.  Dry oral mucosa. Bruised and swollen right periorbital area  Neck: No JVD present. No thyromegaly present.  Cardiovascular: Regular rhythm.   Murmur heard. Regular tachycardia with 3/6 ejection systolic murmur  Respiratory: Effort normal and breath sounds normal. He has no wheezes. He has no rales.  GI: He exhibits distension. There is no tenderness. There is no rebound.  Moderate distention noted with stained dressing over recent paracentesis site right lower quadrant  Musculoskeletal: He exhibits edema.  2+ pitting bilateral edema over lower extremities  Skin: Skin is warm and dry. Rash noted.  Diffuse purpura over upper extremities    Assessment/Plan: 1.Acute kidney injury: Appears to have normal renal function at baseline with a creatinine of around 1.1-1.2 based on review of records and currently with nonoliguric acute renal failure. Clinically, he appears to have edema/excessive fluid in the interstitial space with probable intravascular contraction with likely hemodynamically mediated acute renal injury. Need to rule out the possibility of hepatorenal syndrome with urinalysis and urine electrolytes. I have ordered for a Foley catheter to be placed in order to monitor urine output strictly as well as to assist with continence/collection. Have ordered for a renal ultrasound in order to evaluate renal size and structure and have a low index of suspicion that he has any obstructive uropathy. With his  abdominal exam, highly likely that he has intra-abdominal hypertension but not compartment syndrome at this time. No acute electrolytes to prompt emergent intervention at this time. I agree with holding his diuretics at this time. He was given some intravenous albumin yesterday and I will repeat this at 1 g/kilogram for at least the next 48 hours. Given the rapid reaccumulation of ascitic fluid-may benefit from repeat paracentesis tomorrow. 2. Hyponatremia: Secondary to cirrhosis and impaired water excretion with acute renal failure. Monitor cautiously with IV fluids. Likely also improved by limited oral intake/dehydration. 3. Anemia: Chronic and associated with cirrhosis/chronic disease. Awaiting results of iron studies-ferritin already found to be low at 29 and I am awaiting iron saturation to decide on utility of intravenous iron. He is status post PRBC. 4. Decompensated alcoholic cirrhosis: Seen earlier by gastroenterology anticipation for TIPS has been deferred to [facility] liver care with his current MELD-Na score of 31 and likelihood of worsening hepatic encephalopathy. He is currently on lactulose for hepatic encephalopathy (?from SBP). 5. Possible SBP: I reviewed the results of his ascitic  fluid analysis from yesterday and is significant for 423 WBCs with 50% neutrophils resulting from a turbid fluid. I would recommend empiric antibiotic therapy with Cefotaxime 2gm TID for this after conferring with gastroenterology.  Ondria Oswald K. 07/26/2016, 3:34 PM

## 2016-07-26 NOTE — Progress Notes (Signed)
Spoke with GI and Nephro this afternoon. Peritoneal fluid resulted with WBC 423 with 50% neutrophil. Will start ceftriaxone 2g daily (apparently national shortage of cefotaxime) and albumin 1.5g/kg today, 1g/kg on day 3 (11/1). I am going to hold off on IV iron at this time as this could increase risk of bloodstream infection in setting of abdominal infection.    Devin StainJennifer Earla Charlie, DO [facility] [email or web address] Password [email or web address] 07/26/2016, 4:19 PM

## 2016-07-26 NOTE — Progress Notes (Signed)
Pt with a critical hgb of 6. K. Schorr, NP notified. Will continue to monitor.

## 2016-07-26 NOTE — Progress Notes (Addendum)
PROGRESS NOTE    Devin Noble  ZOX:[id number] DOB: 1956/01/12 DOA: 07/25/2016 PCP: No PCP Per Patient     Brief Narrative:  Devin Noble is a 60 y.o. male with a past medical history significant for alcoholic cirrhosis, refractory ascites, and iron def anemia who presents with dyspnea and swelling. The patient's last paracentesis was on 10/18 for 8L max removed, albumin given, cell count negative. Since then, he has reaccumulated weight followed by worsening dyspnea from abdominal swelling. His dyspnea was severe, so he came to the ER. In the ED, 9L was removed by paracentesis and albumin started.   Cirrhosis history: -Patient diagnosed by GI because of swelling in March 2016 referred from [facility] -No GI follow-up until January 2017 when he was admitted for ascites and pneumonia -Admitted April with ascites, started on high-dose diuretics -Admitted weeks later in April 2017 for hypotension, diuretics stopped -Diuretics adjusted by GI as outpatient and monitored with periodic paracentesis -July 2017, readmitted for orthostasis -Twice monthly paracentesis since July -Weekly paracentesis in October -[facility], but he missed a deadline for some payment this week --Was recommended by [facility] for hospital admission in September 2017 but refused   Assessment & Plan:   Principal Problem:   AKI (acute kidney injury) (HCC) Active Problems:   Protein-calorie malnutrition, severe   Iron deficiency anemia due to chronic blood loss   Alcoholic cirrhosis of liver with ascites (HCC)   Fall   Hyponatremia  AKI -Baseline Cr 1.08. Concern for hepatorenal syndrome, but has been on lasix and spironolactone at home. Will hold diuretics and trend BMP. Check UA  Decompensated liver disease secondary to alcoholic cirrhosis -Coagulopathy and thrombocytopenia as well as ascites, EGD in 03/2016 with grade 2 esophageal varices and portal gastropathy    -S/p paracentesis in ED on 10/29 with 9L removed and albumin; fluid studies ordered  -MELD score of 28 -Consult GI  Hepatic encephalopathy -Ammonia 73. Lactulose TID    Iron deficiency anemia  -No report of recent hematochezia or melena.  GI suspects chronic blood loss from gastropathy.  Has had Hgb 7s since September.  -Transfuse 2u pRBC ordered overnight   Severe protein calorie malnutrition -Nutrition eval  Fall and eye bruise -With mucoid discharge.  Fall from deconditioning in ESLD also likely hepatic encephalopathy.  Has also has had some orthostasis recurrently this year from diuretics.   DVT prophylaxis: SCDs  Code Status: DNR Family Communication: no family at bedside  Disposition Plan: pending further improvement    Consultants:   [facility]   Procedures:   Paracentesis in ED 10/29  Antimicrobials:   None     Subjective: Patient states that he has been very short of breath for the past week. He has been getting outpatient paracentesis every 1-2 weeks, but looks like he calls GI office when his symptoms are worse for paracentesis order. He was recommended for hospitalization back in Sept 2017 for worsening symptoms but refused. He states his breathing is improved since getting paracentesis in the ED yesterday, but still short of breath. No CP or abdminal pain, nausea, vomiting, no fevers, or bloody nor dark Bm, no hematemesis   Objective: Vitals:   07/26/16 0353 07/26/16 0430 07/26/16 0707 07/26/16 0829  BP: 104/63 94/62 96/68  95/61  Pulse: (!) 104 (!) 108 (!) 107 (!) 110  Resp: 20 16 18 18   Temp: 99 F (37.2 C) 98.7 F (37.1 C) 98.2 F (36.8  C) 98.6 F (37 C)  TempSrc: Oral Oral Oral Oral  SpO2:    97%  Weight:      Height:        Intake/Output Summary (Last 24 hours) at 07/26/16 0831 Last data filed at 07/26/16 0707  Gross per 24 hour  Intake              635 ml  Output             9300 ml  Net            -8665 ml   Filed Weights    07/25/16 2343  Weight: 98.1 kg (216 lb 4.3 oz)    Examination:  General exam: Appears calm and comfortable +jaundiced  Respiratory system: Clear to auscultation. Respiratory effort normal, but tachypneic  Cardiovascular system: S1 & S2 heard, tachycardic regular. No JVD, murmurs, rubs, gallops or clicks. +1 pedal edema. Gastrointestinal system: Abdomen is distended with fluid wave, soft, nontender to palpation Central nervous system: Alert and oriented. No focal neurological deficits.  Extremities: Symmetric 5 x 5 power. Skin: +spider angiomas on chest, +dilated vessels across upper abdomen  Psychiatry: Judgement and insight appear normal. Mood & affect appropriate.   Data Reviewed: I have personally reviewed following labs and imaging studies  CBC:  Recent Labs Lab 07/25/16 1750 07/26/16 0102  WBC 8.3 3.7*  NEUTROABS 4.6  --   HGB 7.3* 6.0*  HCT 23.3* 18.6*  MCV 75.9* 76.2*  PLT 194 120*   Basic Metabolic Panel:  Recent Labs Lab 07/25/16 1750 07/26/16 0102  NA 133* 133*  K 4.7 4.3  CL 105 105  CO2 20* 21*  GLUCOSE 126* 135*  BUN 35* 34*  CREATININE 2.38* 2.28*  CALCIUM 8.9 8.6*   GFR: Estimated Creatinine Clearance: 41.6 mL/min (by C-G formula based on SCr of 2.28 mg/dL (H)). Liver Function Tests:  Recent Labs Lab 07/25/16 1750 07/26/16 0102  AST 22 18  ALT 17 13*  ALKPHOS 80 64  BILITOT 2.8* 1.8*  PROT 6.8 6.3*  ALBUMIN 2.9* 3.5    Recent Labs Lab 07/25/16 1750  LIPASE 34    Recent Labs Lab 07/26/16 0102  AMMONIA 73*   Coagulation Profile:  Recent Labs Lab 07/26/16 0102  INR 2.34   Cardiac Enzymes: No results for input(s): CKTOTAL, CKMB, CKMBINDEX, TROPONINI in the last 168 hours. BNP (last 3 results) No results for input(s): PROBNP in the last 8760 hours. HbA1C: No results for input(s): HGBA1C in the last 72 hours. CBG: No results for input(s): GLUCAP in the last 168 hours. Lipid Profile: No results for input(s): CHOL, HDL,  LDLCALC, TRIG, CHOLHDL, LDLDIRECT in the last 72 hours. Thyroid Function Tests: No results for input(s): TSH, T4TOTAL, FREET4, T3FREE, THYROIDAB in the last 72 hours. Anemia Panel:  Recent Labs  07/26/16 0102  FERRITIN 29   Sepsis Labs: No results for input(s): PROCALCITON, LATICACIDVEN in the last 168 hours.  Recent Results (from the past 240 hour(s))  Gram stain     Status: None   Collection Time: 07/25/16  7:45 PM  Result Value Ref Range Status   Specimen Description PERITONEAL  Final   Special Requests NONE  Final   Gram Stain   Final    MODERATE WBC PRESENT,BOTH PMN AND MONONUCLEAR NO ORGANISMS SEEN    Report Status 07/26/2016 FINAL  Final       Radiology Studies: No results found.    Scheduled Meds: . erythromycin   Right Eye QHS  .  feeding supplement (ENSURE ENLIVE)  237 mL Oral BID BM  . ferrous sulfate  325 mg Oral BID WC  . lactulose  20 g Oral TID AC & HS   Continuous Infusions:    LOS: 1 day    Time spent: 40 minutes   Noralee StainJennifer Acen Craun, DO [facility] [email or web address] Password [email or web address] 07/26/2016, 8:31 AM

## 2016-07-26 NOTE — Progress Notes (Signed)
Patient refusing to keep foley catheter in.  Removed per patient's request.  Patient tolerated well.

## 2016-07-27 DIAGNOSIS — K7290 Hepatic failure, unspecified without coma: Secondary | ICD-10-CM | POA: Diagnosis present

## 2016-07-27 DIAGNOSIS — K7682 Hepatic encephalopathy: Secondary | ICD-10-CM | POA: Diagnosis present

## 2016-07-27 DIAGNOSIS — K659 Peritonitis, unspecified: Secondary | ICD-10-CM | POA: Diagnosis present

## 2016-07-27 LAB — BASIC METABOLIC PANEL
Anion gap: 7 (ref 5–15)
BUN: 24 mg/dL — ABNORMAL HIGH (ref 6–20)
CHLORIDE: 106 mmol/L (ref 101–111)
CO2: 21 mmol/L — AB (ref 22–32)
Calcium: 8.8 mg/dL — ABNORMAL LOW (ref 8.9–10.3)
Creatinine, Ser: 1.5 mg/dL — ABNORMAL HIGH (ref 0.61–1.24)
GFR calc non Af Amer: 49 mL/min — ABNORMAL LOW (ref >60)
GFR, EST AFRICAN AMERICAN: 57 mL/min — AB (ref >60)
Glucose, Bld: 99 mg/dL (ref 65–99)
POTASSIUM: 3.4 mmol/L — AB (ref 3.5–5.1)
SODIUM: 134 mmol/L — AB (ref 135–145)

## 2016-07-27 LAB — CBC WITH DIFFERENTIAL/PLATELET
BASOS PCT: 1 %
Basophils Absolute: 0.1 10*3/uL (ref 0.0–0.1)
EOS PCT: 6 %
Eosinophils Absolute: 0.3 10*3/uL (ref 0.0–0.7)
HEMATOCRIT: 22.8 % — AB (ref 39.0–52.0)
Hemoglobin: 7.7 g/dL — ABNORMAL LOW (ref 13.0–17.0)
LYMPHS ABS: 0.6 10*3/uL — AB (ref 0.7–4.0)
Lymphocytes Relative: 12 %
MCH: 26.5 pg (ref 26.0–34.0)
MCHC: 33.8 g/dL (ref 30.0–36.0)
MCV: 78.4 fL (ref 78.0–100.0)
MONO ABS: 0.6 10*3/uL (ref 0.1–1.0)
MONOS PCT: 11 %
NEUTROS ABS: 3.4 10*3/uL (ref 1.7–7.7)
Neutrophils Relative %: 70 %
Platelets: 89 10*3/uL — ABNORMAL LOW (ref 150–400)
RBC: 2.91 MIL/uL — AB (ref 4.22–5.81)
RDW: 21.5 % — AB (ref 11.5–15.5)
WBC: 5 10*3/uL (ref 4.0–10.5)

## 2016-07-27 LAB — TYPE AND SCREEN
ABO/RH(D): B POS
Antibody Screen: NEGATIVE
UNIT DIVISION: 0
UNIT DIVISION: 0

## 2016-07-27 LAB — HEPATIC FUNCTION PANEL
ALBUMIN: 3.9 g/dL (ref 3.5–5.0)
ALK PHOS: 51 U/L (ref 38–126)
ALT: 11 U/L — AB (ref 17–63)
AST: 19 U/L (ref 15–41)
BILIRUBIN TOTAL: 4.7 mg/dL — AB (ref 0.3–1.2)
Bilirubin, Direct: 1.4 mg/dL — ABNORMAL HIGH (ref 0.1–0.5)
Indirect Bilirubin: 3.3 mg/dL — ABNORMAL HIGH (ref 0.3–0.9)
TOTAL PROTEIN: 5.9 g/dL — AB (ref 6.5–8.1)

## 2016-07-27 LAB — UREA NITROGEN, URINE: Urea Nitrogen, Ur: 878 mg/dL

## 2016-07-27 MED ORDER — RIFAXIMIN 550 MG PO TABS
550.0000 mg | ORAL_TABLET | Freq: Two times a day (BID) | ORAL | Status: DC
  Administered 2016-07-27 – 2016-07-30 (×7): 550 mg via ORAL
  Filled 2016-07-27 (×7): qty 1

## 2016-07-27 NOTE — Progress Notes (Signed)
Patient with a small amount of blood in stool. Dr. Alvino Chapelhoi aware. Will continue to monitor.

## 2016-07-27 NOTE — Plan of Care (Signed)
Problem: Pain Managment: Goal: General experience of comfort will improve Outcome: Completed/Met Date Met: 07/27/16 Pt educated on to call when in need of pain med.pt verbalized understanding

## 2016-07-27 NOTE — Care Management Note (Signed)
Case Management Note  Patient Details  Name: [name] MRN: [id number] Date of Birth: May 02, 1956  Subjective/Objective:     CM following for progression and d/c planning.                Action/Plan: 07/27/2016 Met with pt and "granddaughters", who are not actually related to the pt however there are family connections. The pt is currently insisting on going home however is unable to get OOB requiring much assistance. Multiple falls recently at home. Pt states that he could go home to "[employer]" . Per the young women in the room, Angie would most likely allow the pt to d/c to her home and provide care however the pt would not progress as needed. If the pt does this then we can arrange for [facility] to come to the home of this relative and order DME as needed. No Pt eval as yet, spoke with pt RN, Maudie Mercury who is entering PT orders, await PT recommendations. Will continue to follow this pt as currently he really appears to need short term rehab at a SNF in order to be able to function independently. Unclear if pt is actually able to comprehend his situation.    Expected Discharge Date:                  Expected Discharge Plan:  [place]  In-House Referral:  Clinical Social Work  Discharge planning Services  CM Consult  Post Acute Care Choice:  Durable Medical Equipment, Home Health Choice offered to:  Patient  DME Arranged:    DME Agency:     HH Arranged:    [place] Agency:     Status of Service:  In process, will continue to follow  If discussed at Long Length of Stay Meetings, dates discussed:    Additional Comments:  Adron Bene, RN 07/27/2016, 12:52 PM

## 2016-07-27 NOTE — Progress Notes (Signed)
Patient states that he is not taking anymore lactulose. MD aware.

## 2016-07-27 NOTE — Progress Notes (Signed)
PROGRESS NOTE    DENVIL CANNING  ZOX:[id number] DOB: 02-14-1956 DOA: 07/25/2016 PCP: No PCP Per Patient     Brief Narrative:  Devin Noble is a 60 y.o. male with a past medical history significant for alcoholic cirrhosis, refractory ascites, and iron def anemia who presents with dyspnea and swelling. The patient's last paracentesis was on 10/18 for 8L max removed, albumin given, cell count negative. Since then, he has reaccumulated weight followed by worsening dyspnea from abdominal swelling. His dyspnea was severe, so he came to the ER. In the ED, 9L was removed by paracentesis and albumin given. GI and Nephrology were consulted for assistance with decompensated cirrhosis, AKI, and now peritonitis.   Cirrhosis history: -Patient diagnosed by GI because of swelling in March 2016 referred from [facility] -No GI follow-up until January 2017 when he was admitted for ascites and pneumonia -Admitted April with ascites, started on high-dose diuretics -Admitted weeks later in April 2017 for hypotension, diuretics stopped -Diuretics adjusted by GI as outpatient and monitored with periodic paracentesis -July 2017, readmitted for orthostasis -Twice monthly paracentesis since July -Weekly paracentesis in October -Has had referral to [facility] liver clinic for eval for transplant, but he missed a deadline for some payment this week --Was recommended by [facility] for hospital admission in September 2017 but refused   Assessment & Plan:   Principal Problem:   AKI (acute kidney injury) (HCC) Active Problems:   Protein-calorie malnutrition, severe   Iron deficiency anemia due to chronic blood loss   Alcoholic cirrhosis of liver with ascites (HCC)   Fall   Hyponatremia  Peritonitis  -Rocephin 2g QD -Albumin 1.5g/kg on 10/30 and 1.0g/kg on 11/1 -Await fluid culture   AKI -Baseline Cr 1.08. Concern for hepatorenal syndrome, but has been on lasix and spironolactone at home. Will hold diuretics and  trend BMP. Improving today  -Renal US negative for renal abnormalities  -Appreciate Nephrology   Decompensated liver disease secondary to alcoholic cirrhosis -Coagulopathy and thrombocytopenia as well as ascites, EGD in 03/2016 with grade 2 esophageal varices and portal gastropathy   -S/p paracentesis in ED on 10/29 with 9L removed and albumin -Deferred TIPS [facility] -Appreciate GI  -? Need for repeat paracentesis, will defer to GI   Hepatic encephalopathy -Ammonia 73. Lactulose TID, rifaximin added. Patient refusing lactulose, had 3 BM yesterday. Continue to encourage. I discussed with him the importance of these medications  Iron deficiency anemia  -No report of recent hematochezia or melena.  GI suspects chronic blood loss from gastropathy. Chronically anemic with hgb in 7s  -S/p 2u pRBC on 10/30   -Continue PO iron supplementation, holding off on IV therapy for now, but may start when patient's other acute issues more stable  -Trend CBC, transfuse if Hgb < 7.0   Severe protein calorie malnutrition -Nutrition eval  Fall and eye bruise -With mucoid discharge.  Fall from deconditioning in ESLD also likely hepatic encephalopathy.  Has also has had some orthostasis recurrently this year from diuretics.   DVT prophylaxis: SCDs  Code Status: DNR Family Communication: no family at bedside  Disposition Plan: pending further improvement    Consultants:   [facility]   Nephrology   Procedures:   Paracentesis in ED 10/29  Antimicrobials:   Rocephin 10/30 >>   Subjective: Patient has been refusing lactulose. He states that it makes him have bowel movements. He states that he is going to put his pants on and leave the hospital today. He  is alert and oriented, but unaware of the gravity of his medical comorbidities. He denies any symptoms. Afebrile overnight, denies any abdominal pain, chest pain, shortness of breath.   Objective: Vitals:   07/26/16 2115 07/26/16  2145 07/26/16 2215 07/27/16 0512  BP: 100/71 100/72 105/65 (!) 93/53  Pulse: 78 (!) 105 100 100  Resp: 20 20 20 16   Temp: 98.3 F (36.8 C) 98.1 F (36.7 C) 98.1 F (36.7 C) 97.7 F (36.5 C)  TempSrc: Oral Oral Oral Oral  SpO2: 100% 100% 100% 96%  Weight:      Height:        Intake/Output Summary (Last 24 hours) at 07/27/16 1010 Last data filed at 07/27/16 0930  Gross per 24 hour  Intake             1355 ml  Output              300 ml  Net             1055 ml   Filed Weights   07/25/16 2343 07/26/16 2059  Weight: 98.1 kg (216 lb 4.3 oz) 98 kg (216 lb 0.8 oz)    Examination:  General exam: Appears calm and comfortable +jaundiced  Respiratory system: Decreased right base Respiratory effort normal, but tachypneic  Cardiovascular system: S1 & S2 heard, tachycardic regular. No JVD, murmurs, rubs, gallops or clicks. +1 pedal edema. Gastrointestinal system: Abdomen is more distended today with fluid wave, soft, nontender to palpation Central nervous system: Alert and oriented to self and place but not to year. No focal neurological deficits.  Extremities: Symmetric 5 x 5 power. Skin: +spider angiomas on chest, +dilated vessels across upper abdomen  Psychiatry: Judgement and insight appear poor   Data Reviewed: I have personally reviewed following labs and imaging studies  CBC:  Recent Labs Lab 07/25/16 1750 07/26/16 0102 07/27/16 0512  WBC 8.3 3.7* 5.0  NEUTROABS 4.6  --  3.4  HGB 7.3* 6.0* 7.7*  HCT 23.3* 18.6* 22.8*  MCV 75.9* 76.2* 78.4  PLT 194 120* 89*   Basic Metabolic Panel:  Recent Labs Lab 07/25/16 1750 07/26/16 0102 07/27/16 0512  NA 133* 133* 134*  K 4.7 4.3 3.4*  CL 105 105 106  CO2 20* 21* 21*  GLUCOSE 126* 135* 99  BUN 35* 34* 24*  CREATININE 2.38* 2.28* 1.50*  CALCIUM 8.9 8.6* 8.8*   GFR: Estimated Creatinine Clearance: 63.3 mL/min (by C-G formula based on SCr of 1.5 mg/dL (H)). Liver Function Tests:  Recent Labs Lab 07/25/16 1750  07/26/16 0102 07/27/16 0512  AST 22 18 19   ALT 17 13* 11*  ALKPHOS 80 64 51  BILITOT 2.8* 1.8* 4.7*  PROT 6.8 6.3* 5.9*  ALBUMIN 2.9* 3.5 3.9    Recent Labs Lab 07/25/16 1750  LIPASE 34    Recent Labs Lab 07/26/16 0102  AMMONIA 73*   Coagulation Profile:  Recent Labs Lab 07/26/16 0102  INR 2.34   Cardiac Enzymes: No results for input(s): CKTOTAL, CKMB, CKMBINDEX, TROPONINI in the last 168 hours. BNP (last 3 results) No results for input(s): PROBNP in the last 8760 hours. HbA1C: No results for input(s): HGBA1C in the last 72 hours. CBG: No results for input(s): GLUCAP in the last 168 hours. Lipid Profile: No results for input(s): CHOL, HDL, LDLCALC, TRIG, CHOLHDL, LDLDIRECT in the last 72 hours. Thyroid Function Tests: No results for input(s): TSH, T4TOTAL, FREET4, T3FREE, THYROIDAB in the last 72 hours. Anemia Panel:  Recent Labs  07/26/16 0102  FERRITIN 29   Sepsis Labs: No results for input(s): PROCALCITON, LATICACIDVEN in the last 168 hours.  Recent Results (from the past 240 hour(s))  Gram stain     Status: None   Collection Time: 07/25/16  7:45 PM  Result Value Ref Range Status   Specimen Description PERITONEAL  Final   Special Requests NONE  Final   Gram Stain   Final    MODERATE WBC PRESENT,BOTH PMN AND MONONUCLEAR NO ORGANISMS SEEN    Report Status 07/26/2016 FINAL  Final       Radiology Studies: [facility]  Result Date: 07/26/2016 CLINICAL DATA:  Acute kidney injury, history hypertension, cirrhosis EXAM: RENAL / URINARY TRACT ULTRASOUND COMPLETE COMPARISON:  Abdominal ultrasound 01/08/2016 FINDINGS: Right Kidney: Length: 12.1 cm. Normal cortical thickness and echogenicity. No mass, hydronephrosis or shadowing calcification. Left Kidney: Length: 11.1 cm. Normal cortical thickness and echogenicity. No mass, hydronephrosis or shadowing calcification. Bladder: Decompressed by Foley catheter, unable to evaluate. Large volume ascites containing  debris. LEFT pleural effusion noted. IMPRESSION: No significant renal sonographic abnormalities. Large volume ascites and LEFT pleural effusion. Electronically Signed   By: Ulyses SouthwardMark  Boles M.D.   On: 07/26/2016 18:01      Scheduled Meds: . [START ON 07/28/2016] albumin human  100 g Intravenous Once  . cefTRIAXone (ROCEPHIN)  IV  2 g Intravenous Q24H  . erythromycin   Right Eye QHS  . feeding supplement (ENSURE ENLIVE)  237 mL Oral BID BM  . ferrous sulfate  325 mg Oral BID WC  . lactulose  20 g Oral TID AC & HS  . rifaximin  550 mg Oral BID   Continuous Infusions:    LOS: 2 days    Time spent: 30 minutes   Noralee StainJennifer Marcelina Mclaurin, DO [facility] [email or web address] Password [email or web address] 07/27/2016, 10:10 AM

## 2016-07-27 NOTE — Progress Notes (Addendum)
Patient ID: Devin Noble, male   DOB: 1956/07/31, 60 y.o.   MRN: [id number]  [facility] Progress Note   Assessment/ Plan:   1.Acute kidney injury: baseline with a creatinine of around 1.1-1.2 based on review of records and currently with nonoliguric acute renal failure. Suspected to be hemodynamic mediated acute renal injury following large volume paracentesis-possibly complicated by intra-abdominal hypertension given rapid recurrence of ascites. Improving with albumin 1 g/kilogram since yesterday. Renal ultrasound unremarkable. Urine sodium <10 highly suggestive of relative intravascular contraction (suspected HRS with SBP). Agree with continued albumin infusion-no indications as yet to add pressor (low blood pressure noted) 2. Hyponatremia: Secondary to cirrhosis and impaired water excretion with acute renal failure. Improving slowly with some renal recovery. 3. Anemia: Chronic and associated with cirrhosis/chronic disease. With significant iron deficiency-iron saturation of 4% and ferritin of 29: will begin intravenous iron after at least another 2 or 3 more days of antibiotic therapy. He is status post PRBC. 4. Decompensated alcoholic cirrhosis: Seen earlier by gastroenterology anticipation for TIPS [facility] with his current MELD-Na score of 31 and likelihood of worsening hepatic encephalopathy. Lactulose after patient refused further doses, suspect encephalopathy in part from SBP. 5.SBP: Ascitic fluid with 423 WBC (50% neutrophil) meeting criteria for a tendinitis-began on Rocephin yesterday and rifaximin added today. 6. Hypokalemia: Secondary to stool losses/limited intake-replaced via oral route  Subjective:   Reports to be feeling fair-wants to go home and states that he won't take any more lactulose. Denies any abdominal pain but has some intermittent shortness of breath.    Objective:   BP (!) 93/53 (BP Location: Left Arm)   Pulse 100   Temp  97.7 F (36.5 C) (Oral)   Resp 16   Ht 5\' 11"  (1.803 m)   Wt 98 kg (216 lb 0.8 oz)   SpO2 96%   BMI 30.13 kg/m   Intake/Output Summary (Last 24 hours) at 07/27/16 1121 Last data filed at 07/27/16 0930  Gross per 24 hour  Intake             1355 ml  Output              300 ml  Net             1055 ml   Weight change: -0.1 kg (-3.5 oz)  Physical Exam: WUJ:[id number]:Comfortably resting in bed, appears to be more interactive/wakeful compared to yesterday CVS: Pulse regular tachycardia, S1 and S2 with 3/6 ESM Resp: Decreased breath sounds over bases otherwise clear to auscultation Abd: Firm, diffusely distended with mild tenderness over lower quadrants Ext: 1-2+ lower extremity edema  Imaging: [facility]  Result Date: 07/26/2016 CLINICAL DATA:  Acute kidney injury, history hypertension, cirrhosis EXAM: RENAL / URINARY TRACT ULTRASOUND COMPLETE COMPARISON:  Abdominal ultrasound 01/08/2016 FINDINGS: Right Kidney: Length: 12.1 cm. Normal cortical thickness and echogenicity. No mass, hydronephrosis or shadowing calcification. Left Kidney: Length: 11.1 cm. Normal cortical thickness and echogenicity. No mass, hydronephrosis or shadowing calcification. Bladder: Decompressed by Foley catheter, unable to evaluate. Large volume ascites containing debris. LEFT pleural effusion noted. IMPRESSION: No significant renal sonographic abnormalities. Large volume ascites and LEFT pleural effusion. Electronically Signed   By: Ulyses SouthwardMark  Boles M.D.   On: 07/26/2016 18:01    Labs: BMET  Recent Labs Lab 07/25/16 1750 07/26/16 0102 07/27/16 0512  NA 133* 133* 134*  K 4.7 4.3 3.4*  CL 105 105 106  CO2 20* 21* 21*  GLUCOSE 126*  135* 99  BUN 35* 34* 24*  CREATININE 2.38* 2.28* 1.50*  CALCIUM 8.9 8.6* 8.8*   CBC  Recent Labs Lab 07/25/16 1750 07/26/16 0102 07/27/16 0512  WBC 8.3 3.7* 5.0  NEUTROABS 4.6  --  3.4  HGB 7.3* 6.0* 7.7*  HCT 23.3* 18.6* 22.8*  MCV 75.9* 76.2* 78.4  PLT 194 120* 89*    Medications:    . [START ON 07/28/2016] albumin human  100 g Intravenous Once  . cefTRIAXone (ROCEPHIN)  IV  2 g Intravenous Q24H  . erythromycin   Right Eye QHS  . feeding supplement (ENSURE ENLIVE)  237 mL Oral BID BM  . ferrous sulfate  325 mg Oral BID WC  . lactulose  20 g Oral TID AC & HS  . rifaximin  550 mg Oral BID   Zetta BillsJay Elowen Debruyn, MD 07/27/2016, 11:21 AM

## 2016-07-27 NOTE — Progress Notes (Signed)
Daily Rounding Note  07/27/2016, 9:52 AM  LOS: 2 days   SUBJECTIVE:   Chief complaint:  weakness   Now refusing lactulose. Stool is loose, dark brown. Persitent dyspnea.  Abdominal re-swelling, not yet as bad as at admission. Appetitie fair, no nausea.    OBJECTIVE:         Vital signs in last 24 hours:    Temp:  [97.7 F (36.5 C)-98.9 F (37.2 C)] 97.7 F (36.5 C) (10/31 0512) Pulse Rate:  [78-113] 100 (10/31 0512) Resp:  [16-20] 16 (10/31 0512) BP: (83-119)/(53-80) 93/53 (10/31 0512) SpO2:  [95 %-100 %] 96 % (10/31 0512) Weight:  [98 kg (216 lb 0.8 oz)] 98 kg (216 lb 0.8 oz) (10/30 2059) Last BM Date: 07/27/16 Filed Weights   07/25/16 2343 07/26/16 2059  Weight: 98.1 kg (216 lb 4.3 oz) 98 kg (216 lb 0.8 oz)   General: frail, weak   Heart: RRR Chest: dyspneic with minor effort.  Upper airway wheezing. Diminished BS especially on left, no rales/ronchi/crackles.  Abdomen: large, distended, obvious ascites.  NT  Stool in BS commode is dark brown, red blood leaching from this and visible on tissue.  Extremities: no CCE.  No scrotal edema Neuro/Psych:  Asterixis, moves all 4s.  Very slow to move, takes a lot of effort. Oriented to place and year.  Mentation and speech slow.   Intake/Output from previous day: 10/30 0701 - 10/31 0700 In: 1990 [P.O.:920; I.V.:400; Blood:670] Out: 875 [Urine:875]  Intake/Output this shift: Total I/O In: 220 [P.O.:220] Out: -   Lab Results:  Recent Labs  07/25/16 1750 07/26/16 0102 07/27/16 0512  WBC 8.3 3.7* 5.0  HGB 7.3* 6.0* 7.7*  HCT 23.3* 18.6* 22.8*  PLT 194 120* 89*   BMET  Recent Labs  07/25/16 1750 07/26/16 0102 07/27/16 0512  NA 133* 133* 134*  K 4.7 4.3 3.4*  CL 105 105 106  CO2 20* 21* 21*  GLUCOSE 126* 135* 99  BUN 35* 34* 24*  CREATININE 2.38* 2.28* 1.50*  CALCIUM 8.9 8.6* 8.8*   LFT  Recent Labs  07/25/16 1750 07/26/16 0102 07/27/16 0512    PROT 6.8 6.3* 5.9*  ALBUMIN 2.9* 3.5 3.9  AST 22 18 19   ALT 17 13* 11*  ALKPHOS 80 64 51  BILITOT 2.8* 1.8* 4.7*  BILIDIR  --   --  1.4*  IBILI  --   --  3.3*   PT/INR  Recent Labs  07/26/16 0102  LABPROT 26.0*  INR 2.34    Studies/Results: [facility]  Result Date: 07/26/2016 CLINICAL DATA:  Acute kidney injury, history hypertension, cirrhosis EXAM: RENAL / URINARY TRACT ULTRASOUND COMPLETE COMPARISON:  Abdominal ultrasound 01/08/2016 FINDINGS: Right Kidney: Length: 12.1 cm. Normal cortical thickness and echogenicity. No mass, hydronephrosis or shadowing calcification. Left Kidney: Length: 11.1 cm. Normal cortical thickness and echogenicity. No mass, hydronephrosis or shadowing calcification. Bladder: Decompressed by Foley catheter, unable to evaluate. Large volume ascites containing debris. LEFT pleural effusion noted. IMPRESSION: No significant renal sonographic abnormalities. Large volume ascites and LEFT pleural effusion. Electronically Signed   By: Ulyses SouthwardMark  Boles M.D.   On: 07/26/2016 18:01   Scheduled Meds: . [START ON 07/28/2016] albumin human  100 g Intravenous Once  . cefTRIAXone (ROCEPHIN)  IV  2 g Intravenous Q24H  . erythromycin   Right Eye QHS  . feeding supplement (ENSURE ENLIVE)  237 mL Oral BID BM  . ferrous sulfate  325 mg Oral  BID WC  . lactulose  20 g Oral TID AC & HS   Continuous Infusions:  PRN Meds:.acetaminophen **OR** acetaminophen, hydrOXYzine, ondansetron **OR** ondansetron (ZOFRAN) IV, oxyCODONE   ASSESMENT:   *  Decompensated alcoholic cirrhosis.  Meld -Na of 28 in 05/2016.  Current MELD-Na is 31.  [facility] that is for this week.    *  Refractory ascites requiring large volume paracentesis. Has never had evidence for SBP.  Diuretic dosing has been challenging due to AKI.  Within 24 hours of recent large-volume paracentesis, he is already significantly reaccumulated fluid. Latest fluid studies suggest SBP.    Rocephin day 2.   ? Time to consider a TIPS.    *  AKI.  Diuretics on hold. Pt insisted foley on removal, had been ordered by renal. Renal ultrasound with large ascites, left pleural effusion. Renal added additional doses of Albumin through 11/1.   *  Coagulopathy.    *  Hyponatremia, non-critical  *  AMS, hepatic encephalopathy. Home meds include lactulose.  2-4 BMs per day, but not clear he is taking the lactulose as prescribed.  *  Recent falls. Most recent occurred about a week ago, was associated with brief syncope. Sustained abrasion to the left elbow area and hematoma near the right eye.  *  Iron deficiency anemia. Steady decline in the last few months. Indices still microcytic.  Not clear that he's been taking iron twice a day as prescribed.  PO iron resumed now.  S/p PRBC x 2.  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  ? if the recent fall and associated hematoma is contributing to anemia?  *  Thrombocytopenia.   *  Protein calorie malnutrition. Ensure added.    PLAN   *  Rifaximin added.   * Would plan additional paracentesis today or tomorrow.     Jennye MoccasinSarah Gribbin  07/27/2016, 9:52 AM Pager: [phone]     Attending physician's note   I have taken an interval history, reviewed the chart and examined the patient. I agree with the Advanced Practitioner's note, impression and recommendations.  Decompensated alcoholic cirrhosis with refractory ascites and AKI. Current MELD-Na is 31. TIPS an option however HE will worsen and given MELD-Na would [facility]. Nephrology following. Receiving albumin infusions. Repeat large volume paracentesis with albumin and repeat fluid cell counts today or tomorrow. SBP on ceftriaxone Iron deficiency anemia likely due to portal gastropathy noted on EGD in  03/2016. No plans to repeat EGD at this time. IV Fe infusion in hospital-defer hospitalist. Consider PRBC transfusions which may also help renal function and ascites mgmt. HE continue lactulose and rifaximin.    Claudette HeadMalcolm Ly Wass, MD Clementeen GrahamFACG [phone] Mon-Fri 8a-5p [phone] after 5p, weekends, holidays

## 2016-07-28 ENCOUNTER — Inpatient Hospital Stay (HOSPITAL_COMMUNITY): Payer: Medicare Other

## 2016-07-28 DIAGNOSIS — K652 Spontaneous bacterial peritonitis: Secondary | ICD-10-CM

## 2016-07-28 DIAGNOSIS — K659 Peritonitis, unspecified: Secondary | ICD-10-CM

## 2016-07-28 DIAGNOSIS — K7031 Alcoholic cirrhosis of liver with ascites: Secondary | ICD-10-CM

## 2016-07-28 DIAGNOSIS — N179 Acute kidney failure, unspecified: Principal | ICD-10-CM

## 2016-07-28 DIAGNOSIS — D500 Iron deficiency anemia secondary to blood loss (chronic): Secondary | ICD-10-CM

## 2016-07-28 DIAGNOSIS — K7290 Hepatic failure, unspecified without coma: Secondary | ICD-10-CM

## 2016-07-28 LAB — CBC WITH DIFFERENTIAL/PLATELET
BASOS PCT: 0 %
Basophils Absolute: 0 10*3/uL (ref 0.0–0.1)
EOS ABS: 0.7 10*3/uL (ref 0.0–0.7)
Eosinophils Relative: 8 %
HEMATOCRIT: 24.1 % — AB (ref 39.0–52.0)
HEMOGLOBIN: 8.1 g/dL — AB (ref 13.0–17.0)
LYMPHS ABS: 0.9 10*3/uL (ref 0.7–4.0)
LYMPHS PCT: 10 %
MCH: 26 pg (ref 26.0–34.0)
MCHC: 33.6 g/dL (ref 30.0–36.0)
MCV: 77.2 fL — ABNORMAL LOW (ref 78.0–100.0)
MONOS PCT: 13 %
Monocytes Absolute: 1.2 10*3/uL — ABNORMAL HIGH (ref 0.1–1.0)
NEUTROS ABS: 6.4 10*3/uL (ref 1.7–7.7)
Neutrophils Relative %: 69 %
Platelets: 127 10*3/uL — ABNORMAL LOW (ref 150–400)
RBC: 3.12 MIL/uL — ABNORMAL LOW (ref 4.22–5.81)
RDW: 22.3 % — ABNORMAL HIGH (ref 11.5–15.5)
WBC: 9.2 10*3/uL (ref 4.0–10.5)

## 2016-07-28 LAB — BODY FLUID CELL COUNT WITH DIFFERENTIAL
EOS FL: 1 %
LYMPHS FL: 45 %
MONOCYTE-MACROPHAGE-SEROUS FLUID: 38 % — AB (ref 50–90)
Neutrophil Count, Fluid: 16 % (ref 0–25)
Total Nucleated Cell Count, Fluid: 185 cu mm (ref 0–1000)

## 2016-07-28 LAB — BASIC METABOLIC PANEL
ANION GAP: 8 (ref 5–15)
BUN: 22 mg/dL — AB (ref 6–20)
CALCIUM: 8.6 mg/dL — AB (ref 8.9–10.3)
CO2: 21 mmol/L — AB (ref 22–32)
Chloride: 104 mmol/L (ref 101–111)
Creatinine, Ser: 1.36 mg/dL — ABNORMAL HIGH (ref 0.61–1.24)
GFR calc Af Amer: >60 mL/min (ref >60)
GFR calc non Af Amer: 55 mL/min — ABNORMAL LOW (ref >60)
GLUCOSE: 96 mg/dL (ref 65–99)
Potassium: 3.8 mmol/L (ref 3.5–5.1)
Sodium: 133 mmol/L — ABNORMAL LOW (ref 135–145)

## 2016-07-28 LAB — HEPATIC FUNCTION PANEL
ALK PHOS: 49 U/L (ref 38–126)
ALT: 14 U/L — ABNORMAL LOW (ref 17–63)
AST: 25 U/L (ref 15–41)
Albumin: 3.3 g/dL — ABNORMAL LOW (ref 3.5–5.0)
BILIRUBIN INDIRECT: 2.3 mg/dL — AB (ref 0.3–0.9)
BILIRUBIN TOTAL: 3.3 mg/dL — AB (ref 0.3–1.2)
Bilirubin, Direct: 1 mg/dL — ABNORMAL HIGH (ref 0.1–0.5)
Total Protein: 5.7 g/dL — ABNORMAL LOW (ref 6.5–8.1)

## 2016-07-28 LAB — ALBUMIN, FLUID (OTHER)

## 2016-07-28 MED ORDER — SODIUM CHLORIDE 0.9 % IV SOLN
510.0000 mg | Freq: Once | INTRAVENOUS | Status: AC
  Administered 2016-07-29: 510 mg via INTRAVENOUS
  Filled 2016-07-28 (×2): qty 17

## 2016-07-28 MED ORDER — LACTULOSE 10 GM/15ML PO SOLN
30.0000 g | Freq: Every day | ORAL | Status: DC
  Filled 2016-07-28: qty 45

## 2016-07-28 MED ORDER — ALBUMIN HUMAN 25 % IV SOLN
100.0000 g | Freq: Once | INTRAVENOUS | Status: AC
  Administered 2016-07-28: 100 g via INTRAVENOUS
  Filled 2016-07-28: qty 400

## 2016-07-28 MED ORDER — LIDOCAINE HCL 1 % IJ SOLN
INTRAMUSCULAR | Status: AC
  Filled 2016-07-28: qty 20

## 2016-07-28 NOTE — Progress Notes (Signed)
Daily Rounding Note  07/28/2016, 9:40 AM  LOS: 3 days   SUBJECTIVE:   Chief complaint:  Abdominal distention.  Improved. Still feels tired Weight stable, measured before latest tap.  Underwent 9 L paracentesis this morning.  To receive albumin at 1 PM today Refusing lactuose because he is unable to get to bathroom on time and does not want accidents. D/w pt and he agrees to take the AM dose, will not take dose now.   OBJECTIVE:         Vital signs in last 24 hours:    Temp:  [97.5 F (36.4 C)-98.9 F (37.2 C)] 98.4 F (36.9 C) (11/01 0300) Pulse Rate:  [99-107] 101 (11/01 0800) Resp:  [16-18] 18 (11/01 0800) BP: (88-111)/(64-85) 100/70 (11/01 0800) SpO2:  [94 %-99 %] 94 % (11/01 0800) Weight:  [98.4 kg (216 lb 14.4 oz)] 98.4 kg (216 lb 14.4 oz) (10/31 2221) Last BM Date: 07/27/16 Filed Weights   07/25/16 2343 07/26/16 2059 07/27/16 2221  Weight: 98.1 kg (216 lb 4.3 oz) 98 kg (216 lb 0.8 oz) 98.4 kg (216 lb 14.4 oz)   General: looks ill, frail.   Heart: RRR Chest: clear bil but poor BS and persitent tight cough Abdomen: softer but ascites and distention still prominent.  Able to visualize caput medusa today  Extremities: no pedal edema Neuro/Psych:  Asterixis persists, slightly more alert and sharp but still displays psychomotor retardation.   Intake/Output from previous day: 10/31 0701 - 11/01 0700 In: 690 [P.O.:640; IV Piggyback:50] Out: -   Intake/Output this shift: No intake/output data recorded.  Lab Results:  Recent Labs  07/26/16 0102 07/27/16 0512 07/28/16 0633  WBC 3.7* 5.0 9.2  HGB 6.0* 7.7* 8.1*  HCT 18.6* 22.8* 24.1*  PLT 120* 89* 127*   BMET  Recent Labs  07/26/16 0102 07/27/16 0512 07/28/16 0633  NA 133* 134* 133*  K 4.3 3.4* 3.8  CL 105 106 104  CO2 21* 21* 21*  GLUCOSE 135* 99 96  BUN 34* 24* 22*  CREATININE 2.28* 1.50* 1.36*  CALCIUM 8.6* 8.8* 8.6*   LFT  Recent  Labs  07/26/16 0102 07/27/16 0512 07/28/16 0633  PROT 6.3* 5.9* 5.7*  ALBUMIN 3.5 3.9 3.3*  AST 18 19 25   ALT 13* 11* 14*  ALKPHOS 64 51 49  BILITOT 1.8* 4.7* 3.3*  BILIDIR  --  1.4* 1.0*  IBILI  --  3.3* 2.3*   PT/INR  Recent Labs  07/26/16 0102  LABPROT 26.0*  INR 2.34   Hepatitis Panel No results for input(s): HEPBSAG, HCVAB, HEPAIGM, HEPBIGM in the last 72 hours.  Studies/Results: [facility]  Result Date: 07/26/2016 CLINICAL DATA:  Acute kidney injury, history hypertension, cirrhosis EXAM: RENAL / URINARY TRACT ULTRASOUND COMPLETE COMPARISON:  Abdominal ultrasound 01/08/2016 FINDINGS: Right Kidney: Length: 12.1 cm. Normal cortical thickness and echogenicity. No mass, hydronephrosis or shadowing calcification. Left Kidney: Length: 11.1 cm. Normal cortical thickness and echogenicity. No mass, hydronephrosis or shadowing calcification. Bladder: Decompressed by Foley catheter, unable to evaluate. Large volume ascites containing debris. LEFT pleural effusion noted. IMPRESSION: No significant renal sonographic abnormalities. Large volume ascites and LEFT pleural effusion. Electronically Signed   By: Ulyses SouthwardMark  Boles M.D.   On: 07/26/2016 18:01   Scheduled Meds: . albumin human  100 g Intravenous Once  . cefTRIAXone (ROCEPHIN)  IV  2 g Intravenous Q24H  . erythromycin   Right Eye QHS  . feeding supplement (ENSURE ENLIVE)  237 mL Oral BID BM  . ferrous sulfate  325 mg Oral BID WC  . lactulose  20 g Oral TID AC & HS  . lidocaine      . rifaximin  550 mg Oral BID   Continuous Infusions:  PRN Meds:.acetaminophen **OR** acetaminophen, hydrOXYzine, ondansetron **OR** ondansetron (ZOFRAN) IV, oxyCODONE   ASSESMENT:   * Decompensated alcoholic cirrhosis. MELD-Na 31.  Was to [facility] eith  [facility] in [place] this week.   * Refractory ascites requiring large volume paracentesis. Diuretic dosing has been challenging due to AKI.  Latest fluid studies suggest SBP.    Rocephin day 3.   9 liter tap again today:   * AKI. Diuretics on hold.  Labs improved.  Renal ultrasound with large ascites, left pleural effusion. Renal added additional doses of Albumin through 11/1.   *Coagulopathy.   * Hyponatremia, non-critical  * AMS, hepatic encephalopathy:  persists.  Refusing Lactulose as of 10/31.  Rifaxamin added. Suspect SBP also playing role: on Rocephin.  Pt agreeable to take AM dose of lactulose so will adjust schedule and increase that dose.   * Recent falls with minor elbow and right orbital trauma.   * Iron deficiency anemia. Not clear that he's taking iron twice a day as prescribed.  PO iron resumed now.  S/p PRBC x 2.  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   * Thrombocytopenia.   * Protein calorie malnutrition. Ensure added.     PLAN   *  Pt is certain to reaccumulate ascites rapidly.  Restarting diuretics is dubious.  Missed liver transplant appt today and had not submitted the required paperwork for the appt anyway.  At present he is not transplant candidate.   ? Involve paliative care, goals of care consult with pt and his family.  He does not have a designated HCPOA.     Jennye MoccasinSarah Gribbin  07/28/2016, 9:40 AM Pager: [phone]      Attending physician's note   I have taken an interval history, reviewed the chart and examined the patient. I agree with the Advanced Practitioner's note, impression and recommendations.  Decompensated alcoholic cirrhosis with refractory ascites and AKI. Receiving albumin infusions. Repeat large volume paracentesis with 9L removed. Fluid cell counts today are pending. Renal following. Cr has improved. Restarting diuretics could be problematic. Not sure he is motivated for evaluation with [facility]. Prognosis is poor.  SBP on ceftriaxone.  Claudette HeadMalcolm  Stark, MD Clementeen GrahamFACG [phone] Mon-Fri 8a-5p [phone] after 5p, weekends, holidays

## 2016-07-28 NOTE — Evaluation (Signed)
Physical Therapy Evaluation Patient Details Name: Devin Noble L Buster MRN: [id number] DOB: May 07, 1956 Today's Date: 07/28/2016   History of Present Illness  Pt is a 60 y/o male admitted secondary to abdominal swelling and dyspnea. PMH including but not limited to alcoholic cirrohsis, HTN and L TSA in 2014.  Clinical Impression  Pt presented supine in bed with HOB elevated, awake and willing to participate in therapy session. Pt's daughter was present throughout session as well. Prior to admission, pt reported that he was independent with all functional mobility and ADLs. 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 Based on pt's performance during evaluation, PT recommending pt d/c to SNF prior to returning home for short-term rehab to increase strength, balance, coordination and to address his below listed limitations. Pt and pt's daughter were agreeable to this plan. Pt would continue to benefit from skilled physical therapy services at this time while admitted and after d/c to address his below listed limitations in order to improve his overall safety and independence with functional mobility.     Follow Up Recommendations SNF;Supervision/Assistance - 24 hour (for short-term rehab )    Equipment Recommendations  None recommended by PT    Recommendations for Other Services       Precautions / Restrictions Precautions Precautions: Fall Restrictions Weight Bearing Restrictions: No      Mobility  Bed Mobility Overal bed mobility: Needs Assistance Bed Mobility: Supine to Sit;Sit to Supine     Supine to sit: Min guard;HOB elevated Sit to supine: Min guard   General bed mobility comments: pt required increased time and min guard for safety  Transfers Overall transfer level: Needs assistance Equipment used: Rolling walker (2 wheeled) Transfers: Sit to/from Stand Sit to Stand: Min guard         General transfer comment: pt required increased time, VC'ing for safety and technique and  min guard for safety  Ambulation/Gait Ambulation/Gait assistance: Min guard Ambulation Distance (Feet): 15 Feet (15' x2 with sitting rest break on toilet in bathroom) Assistive device: Rolling walker (2 wheeled) Gait Pattern/deviations: Step-through pattern;Decreased step length - right;Decreased step length - left;Decreased stride length Gait velocity: decreased Gait velocity interpretation: Below normal speed for age/gender General Gait Details: pt did not demonstrate any LOB; however, he required VC'ing for safety with use of RW to maintain a safe distance and to navigate around obstacles  Stairs            Wheelchair Mobility    Modified Rankin (Stroke Patients Only)       Balance Overall balance assessment: Needs assistance;History of Falls Sitting-balance support: Feet supported;No upper extremity supported Sitting balance-Leahy Scale: Fair     Standing balance support: During functional activity;Single extremity supported Standing balance-Leahy Scale: Poor Standing balance comment: pt able to statically stand at toilet with one UE on grab bar                             Pertinent Vitals/Pain Pain Assessment: No/denies pain    Home Living Family/patient expects to be discharged to:: Private residence Living Arrangements: Alone Available Help at Discharge: Family;Friend(s);Available PRN/intermittently Type of Home: House Home Access: Stairs to enter Entrance Stairs-Rails: None Entrance Stairs-Number of Steps: 2 Home Layout: One level Home Equipment: Walker - 2 wheels Additional Comments: pt stated he is willing to d/c to SNF for short-term rehab    Prior Function Level of Independence: Independent  Hand Dominance        Extremity/Trunk Assessment   Upper Extremity Assessment: Generalized weakness           Lower Extremity Assessment: Generalized weakness      Cervical / Trunk Assessment: Kyphotic   Communication   Communication: No difficulties  Cognition Arousal/Alertness: Lethargic Behavior During Therapy: WFL for tasks assessed/performed;Flat affect Overall Cognitive Status: Impaired/Different from baseline Area of Impairment: Memory;Safety/judgement;Awareness         Safety/Judgement: Decreased awareness of safety;Decreased awareness of deficits     General Comments: Pt's daughter at bedside, stating that "what he says just doesn't make sense".    General Comments      Exercises     Assessment/Plan    PT Assessment Patient needs continued PT services  PT Problem List Decreased strength;Decreased range of motion;Decreased activity tolerance;Decreased balance;Decreased mobility;Decreased coordination;Decreased knowledge of use of DME;Decreased safety awareness          PT Treatment Interventions DME instruction;Gait training;Stair training;Functional mobility training;Therapeutic activities;Therapeutic exercise;Balance training;Neuromuscular re-education;Patient/family education    PT Goals (Current goals can be found in the Care Plan section)  Acute Rehab PT Goals Patient Stated Goal: go to rehab prior to returning home to his dog PT Goal Formulation: With patient/family Time For Goal Achievement: 08/11/16 Potential to Achieve Goals: Good    Frequency Min 3X/week   Barriers to discharge        Co-evaluation               End of Session Equipment Utilized During Treatment: Gait belt Activity Tolerance: Patient limited by fatigue Patient left: in bed;with call bell/phone within reach;with family/visitor present Nurse Communication: Mobility status         Time: 1700-1740 PT Time Calculation (min) (ACUTE ONLY): 40 min   Charges:   PT Evaluation $PT Eval Moderate Complexity: 1 Procedure PT Treatments $Gait Training: 23-37 mins   PT G CodesAlessandra Bevels:        Sherle Mello M Donye Campanelli 07/28/2016, 6:04 PM Deborah ChalkJennifer Kaianna Dolezal, PT, DPT [phone]

## 2016-07-28 NOTE — Progress Notes (Addendum)
Patient ID: Devin Noble, male   DOB: 1956-03-31, 60 y.o.   MRN: [id number]  [facility] Progress Note   Assessment/ Plan:   1.Acute kidney injury: baseline with a creatinine of around 1.1-1.2 based on review of records and currently with nonoliguric acute renal failure. Suspected to be hemodynamic mediated acute renal injury following large volume paracentesis-possibly complicated by intra-abdominal hypertension given rapid recurrence of ascites. Improving with albumin 1 g/kilogram since yesterday (which weighs against HRS). Renal ultrasound unremarkable. Urine sodium <10 highly suggestive of relative intravascular contraction (suspected HRS with SBP). Agree with continued albumin infusion 100gm today as previously prescribed. Would recommend monitoring here at the hospital for at least another 24 hours. 2. Hyponatremia: Secondary to cirrhosis and impaired water excretion with acute renal failure. Appears to be stable/unchanged overnight. 3. Anemia: Chronic and associated with cirrhosis/chronic disease. With significant iron deficiency-iron saturation of 4% and ferritin of 29: will begin intravenous iron after at least another 2 more days of antibiotic therapy. He is status post PRBC. 4. Decompensated alcoholic cirrhosis: Seen earlier by gastroenterology anticipation [facility] with his current MELD-Na score of 31 and likelihood of worsening hepatic encephalopathy. Lactulose after patient refused further doses, suspect encephalopathy in part from SBP. 5.SBP: Ascitic fluid with 423 WBC (50% neutrophil) meeting criteria for peritonitis, continue intravenous Rocephin . 6. Hypokalemia: Secondary to stool losses/limited intake-repleted via oral route  Subjective:   Reports to be tired this morning-just returned back from paracentesis. He inquires about going home. Note from case management noted.   Objective:   BP 107/77 (BP Location: Left Arm)   Pulse  (!) 101   Temp 98.4 F (36.9 C) (Oral)   Resp 18   Ht 5\' 11"  (1.803 m)   Wt 98.4 kg (216 lb 14.4 oz)   SpO2 94%   BMI 30.25 kg/m   Intake/Output Summary (Last 24 hours) at 07/28/16 1210 Last data filed at 07/28/16 0900  Gross per 24 hour  Intake              710 ml  Output                0 ml  Net              710 ml   Weight change: 0.385 kg (13.6 oz)  Physical Exam: NWG:[id number]:Comfortably resting in bed, appears fatigued CVS: Pulse regular tachycardia, S1 and S2 with 3/6 ESM Resp: Decreased breath sounds over bases otherwise clear to auscultation Abd: soft, moderatelt distended with no tenderness over lower quadrants Ext: 1+ lower extremity edema  Imaging: [facility]  Result Date: 07/26/2016 CLINICAL DATA:  Acute kidney injury, history hypertension, cirrhosis EXAM: RENAL / URINARY TRACT ULTRASOUND COMPLETE COMPARISON:  Abdominal ultrasound 01/08/2016 FINDINGS: Right Kidney: Length: 12.1 cm. Normal cortical thickness and echogenicity. No mass, hydronephrosis or shadowing calcification. Left Kidney: Length: 11.1 cm. Normal cortical thickness and echogenicity. No mass, hydronephrosis or shadowing calcification. Bladder: Decompressed by Foley catheter, unable to evaluate. Large volume ascites containing debris. LEFT pleural effusion noted. IMPRESSION: No significant renal sonographic abnormalities. Large volume ascites and LEFT pleural effusion. Electronically Signed   By: Ulyses SouthwardMark  Boles M.D.   On: 07/26/2016 18:01    Labs: BMET  Recent Labs Lab 07/25/16 1750 07/26/16 0102 07/27/16 0512 07/28/16 0633  NA 133* 133* 134* 133*  K 4.7 4.3 3.4* 3.8  CL 105 105 106 104  CO2 20* 21* 21* 21*  GLUCOSE 126*  135* 99 96  BUN 35* 34* 24* 22*  CREATININE 2.38* 2.28* 1.50* 1.36*  CALCIUM 8.9 8.6* 8.8* 8.6*   CBC  Recent Labs Lab 07/25/16 1750 07/26/16 0102 07/27/16 0512 07/28/16 0633  WBC 8.3 3.7* 5.0 9.2  NEUTROABS 4.6  --  3.4 6.4  HGB 7.3* 6.0* 7.7* 8.1*  HCT 23.3* 18.6* 22.8*  24.1*  MCV 75.9* 76.2* 78.4 77.2*  PLT 194 120* 89* 127*   Medications:    . albumin human  100 g Intravenous Once  . cefTRIAXone (ROCEPHIN)  IV  2 g Intravenous Q24H  . erythromycin   Right Eye QHS  . feeding supplement (ENSURE ENLIVE)  237 mL Oral BID BM  . ferrous sulfate  325 mg Oral BID WC  . lactulose  20 g Oral TID AC & HS  . lidocaine      . rifaximin  550 mg Oral BID   Zetta BillsJay Shaquill Iseman, MD 07/28/2016, 12:10 PM

## 2016-07-28 NOTE — Progress Notes (Signed)
[facility] PROGRESS NOTE  Devin Noble ZOX:[id number] DOB: 1956-03-23 DOA: 07/25/2016  PCP: No PCP Per Patient  Brief History/Interval Summary: 60 year old Caucasian male with a past medical history of alcoholic liver cirrhosis, refractory ascites with multiple paracentesis in the past, iron deficiency anemia, presented with shortness of breath and abdominal distention. Patient was hospitalized. He underwent paracentesis. Fluid analysis raised concern for SBP. Patient was seen by gastroenterology. He subsequently developed acute renal failure and nephrology was also consulted.  Reason for Visit: Acute renal failure and ascites  Consultants: Gastroenterology and nephrology  Procedures: Paracentesis 2  Antibiotics: Ceftriaxone  Subjective/Interval History: Patient complains of abdominal distention this morning. Appears to be causing him shortness of breath. He also has abdominal discomfort. He denies any chest pain.  ROS: Denies any nausea or vomiting.  Objective:  Vital Signs  Vitals:   07/28/16 1020 07/28/16 1026 07/28/16 1031 07/28/16 1051  BP: 98/70 107/79 103/78 107/77  Pulse:      Resp:      Temp:      TempSrc:      SpO2:      Weight:      Height:        Intake/Output Summary (Last 24 hours) at 07/28/16 1240 Last data filed at 07/28/16 0900  Gross per 24 hour  Intake              710 ml  Output                0 ml  Net              710 ml   Filed Weights   07/25/16 2343 07/26/16 2059 07/27/16 2221  Weight: 98.1 kg (216 lb 4.3 oz) 98 kg (216 lb 0.8 oz) 98.4 kg (216 lb 14.4 oz)    General appearance: alert, cooperative, appears stated age and no distress Head: Normocephalic, without obvious abnormality, atraumatic Resp: Appears to be tachypneic. Diminished air entry at the bases without any definite crackles, wheezing or rhonchi. Cardio: regular rate and rhythm, S1, S2 normal, no murmur, click, rub or gallop GI: Abdomen is distended. Tender  diffusely without any rebound or guarding. No ascites is present. Extremities: Minimal edema noted bilateral lower extremities Neurologic: Awake and alert. Mildly distracted. No focal neurological deficits.  Lab Results:  Data Reviewed: I have personally reviewed following labs and imaging studies  CBC:  Recent Labs Lab 07/25/16 1750 07/26/16 0102 07/27/16 0512 07/28/16 0633  WBC 8.3 3.7* 5.0 9.2  NEUTROABS 4.6  --  3.4 6.4  HGB 7.3* 6.0* 7.7* 8.1*  HCT 23.3* 18.6* 22.8* 24.1*  MCV 75.9* 76.2* 78.4 77.2*  PLT 194 120* 89* 127*    Basic Metabolic Panel:  Recent Labs Lab 07/25/16 1750 07/26/16 0102 07/27/16 0512 07/28/16 0633  NA 133* 133* 134* 133*  K 4.7 4.3 3.4* 3.8  CL 105 105 106 104  CO2 20* 21* 21* 21*  GLUCOSE 126* 135* 99 96  BUN 35* 34* 24* 22*  CREATININE 2.38* 2.28* 1.50* 1.36*  CALCIUM 8.9 8.6* 8.8* 8.6*    GFR: Estimated Creatinine Clearance: 69.9 mL/min (by C-G formula based on SCr of 1.36 mg/dL (H)).  Liver Function Tests:  Recent Labs Lab 07/25/16 1750 07/26/16 0102 07/27/16 0512 07/28/16 0633  AST 22 18 19 25   ALT 17 13* 11* 14*  ALKPHOS 80 64 51 49  BILITOT 2.8* 1.8* 4.7* 3.3*  PROT 6.8 6.3* 5.9* 5.7*  ALBUMIN 2.9* 3.5 3.9 3.3*  Recent Labs Lab 07/25/16 1750  LIPASE 34    Recent Labs Lab 07/26/16 0102  AMMONIA 73*    Coagulation Profile:  Recent Labs Lab 07/26/16 0102  INR 2.34    Anemia Panel:  Recent Labs  07/26/16 0102  FERRITIN 29    Recent Results (from the past 240 hour(s))  Culture, body fluid-bottle     Status: None (Preliminary result)   Collection Time: 07/25/16  7:45 PM  Result Value Ref Range Status   Specimen Description BLOOD PERITONEAL  Final   Special Requests BOTTLES DRAWN AEROBIC ONLY 5CC  Final   Culture NO GROWTH 1 DAY  Final   Report Status PENDING  Incomplete  Gram stain     Status: None   Collection Time: 07/25/16  7:45 PM  Result Value Ref Range Status   Specimen  Description PERITONEAL  Final   Special Requests NONE  Final   Gram Stain   Final    MODERATE WBC PRESENT,BOTH PMN AND MONONUCLEAR NO ORGANISMS SEEN    Report Status 07/26/2016 FINAL  Final  Culture, blood (routine x 2)     Status: None (Preliminary result)   Collection Time: 07/26/16  6:38 PM  Result Value Ref Range Status   Specimen Description BLOOD LEFT ANTECUBITAL  Final   Special Requests BOTTLES DRAWN AEROBIC AND ANAEROBIC 6CC  Final   Culture NO GROWTH < 24 HOURS  Final   Report Status PENDING  Incomplete  Culture, blood (routine x 2)     Status: None (Preliminary result)   Collection Time: 07/26/16  6:50 PM  Result Value Ref Range Status   Specimen Description BLOOD RIGHT HAND  Final   Special Requests BOTTLES DRAWN AEROBIC AND ANAEROBIC 5CC  Final   Culture NO GROWTH < 24 HOURS  Final   Report Status PENDING  Incomplete      Radiology Studies: [place] Renal  Result Date: 07/26/2016 CLINICAL DATA:  Acute kidney injury, history hypertension, cirrhosis EXAM: RENAL / URINARY TRACT ULTRASOUND COMPLETE COMPARISON:  Abdominal ultrasound 01/08/2016 FINDINGS: Right Kidney: Length: 12.1 cm. Normal cortical thickness and echogenicity. No mass, hydronephrosis or shadowing calcification. Left Kidney: Length: 11.1 cm. Normal cortical thickness and echogenicity. No mass, hydronephrosis or shadowing calcification. Bladder: Decompressed by Foley catheter, unable to evaluate. Large volume ascites containing debris. LEFT pleural effusion noted. IMPRESSION: No significant renal sonographic abnormalities. Large volume ascites and LEFT pleural effusion. Electronically Signed   By: Ulyses SouthwardMark  Boles M.D.   On: 07/26/2016 18:01     Medications:  Scheduled: . albumin human  100 g Intravenous Once  . cefTRIAXone (ROCEPHIN)  IV  2 g Intravenous Q24H  . erythromycin   Right Eye QHS  . feeding supplement (ENSURE ENLIVE)  237 mL Oral BID BM  . ferrous sulfate  325 mg Oral BID WC  . [START ON 07/29/2016]  ferumoxytol  510 mg Intravenous Once  . lactulose  20 g Oral TID AC & HS  . lidocaine      . rifaximin  550 mg Oral BID   Continuous:  ZOX:[id number]:acetaminophen **OR** acetaminophen, hydrOXYzine, ondansetron **OR** ondansetron (ZOFRAN) IV, oxyCODONE  Assessment/Plan:  Principal Problem:   Peritonitis (HCC) Active Problems:   Protein-calorie malnutrition, severe   Iron deficiency anemia due to chronic blood loss   Alcoholic cirrhosis of liver with ascites (HCC)   Fall   AKI (acute kidney injury) (HCC)   Encephalopathy, hepatic (HCC)    Peritonitis  Patient is on ceftriaxone, which will be continued. Gastroenterology is  following. Paracentesis to be done today as recommended by gastroenterology. Cell count to be obtained at that time. Initial set of cultures have not shown any growth.  Acute kidney injury  Patient's baseline creatinine is around 1.08. Patient had worsening of his creatinine after paracentesis. Concern was for hepatorenal syndrome. The patient was on diuretics at home. Patient was seen by nephrology. Albumin was initiated. Renal function has improved. Since patient will need to undergo paracentesis again today we'll need to monitor his renal function closely. Renal ultrasound was negative for any renal abnormalities.   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 Gastroenterology is following. Patient is status post paracentesis 2 during this hospitalization itself. Patient is on albumin. Deferred TIPS to [facility] liver care.   Hepatic encephalopathy Ammonia 73. Lactulose TID, rifaximin added. Patient has been refusing to take lactulose as he does not want to have loose stools. Have continued to encourage him to take this medication. He is also on the cement.   Iron deficiency anemia  No report of recent hematochezia or melena. GI  suspects chronic blood loss from gastropathy.Chronically anemic with hgb in 7s. S/p 2u pRBC on 10/30. Continue PO iron supplementation, holding off on IV therapy for now, but may start when patient's other acute issues more stable.   Severe protein calorie malnutrition Nutrition eval  Fall and eye bruise Fall from deconditioning in ESLD also likely hepatic encephalopathy. Has also has had some orthostasis recurrently this year from diuretics. PT evaluation is pending.   DVT Prophylaxis: SCDs    Code Status: DO NOT RESUSCITATE  Family Communication: No family is available  Disposition Plan: Await PT evaluation. Await paracentesis. Specialty input also pending today. Continue to monitor.     LOS: 3 days   [facility]  [facility] Pager [phone] 07/28/2016, 12:40 PM  If 7PM-7AM, please contact night-coverage at [email or web address], password [facility]

## 2016-07-29 DIAGNOSIS — Z515 Encounter for palliative care: Secondary | ICD-10-CM

## 2016-07-29 DIAGNOSIS — Z66 Do not resuscitate: Secondary | ICD-10-CM

## 2016-07-29 DIAGNOSIS — K652 Spontaneous bacterial peritonitis: Secondary | ICD-10-CM

## 2016-07-29 LAB — CBC WITH DIFFERENTIAL/PLATELET
BASOS ABS: 0.1 10*3/uL (ref 0.0–0.1)
Basophils Relative: 1 %
EOS ABS: 0.7 10*3/uL (ref 0.0–0.7)
Eosinophils Relative: 8 %
HCT: 23.5 % — ABNORMAL LOW (ref 39.0–52.0)
HEMOGLOBIN: 7.9 g/dL — AB (ref 13.0–17.0)
LYMPHS ABS: 0.5 10*3/uL — AB (ref 0.7–4.0)
LYMPHS PCT: 6 %
MCH: 26.2 pg (ref 26.0–34.0)
MCHC: 33.6 g/dL (ref 30.0–36.0)
MCV: 77.8 fL — ABNORMAL LOW (ref 78.0–100.0)
MONO ABS: 1.4 10*3/uL — AB (ref 0.1–1.0)
Monocytes Relative: 16 %
NEUTROS ABS: 6 10*3/uL (ref 1.7–7.7)
Neutrophils Relative %: 69 %
PLATELETS: 117 10*3/uL — AB (ref 150–400)
RBC: 3.02 MIL/uL — ABNORMAL LOW (ref 4.22–5.81)
RDW: 23.1 % — AB (ref 11.5–15.5)
WBC: 8.7 10*3/uL (ref 4.0–10.5)

## 2016-07-29 LAB — PATHOLOGIST SMEAR REVIEW

## 2016-07-29 LAB — BASIC METABOLIC PANEL
ANION GAP: 8 (ref 5–15)
BUN: 20 mg/dL (ref 6–20)
CALCIUM: 8.3 mg/dL — AB (ref 8.9–10.3)
CO2: 21 mmol/L — ABNORMAL LOW (ref 22–32)
CREATININE: 1.19 mg/dL (ref 0.61–1.24)
Chloride: 102 mmol/L (ref 101–111)
GFR calc Af Amer: >60 mL/min (ref >60)
GLUCOSE: 91 mg/dL (ref 65–99)
Potassium: 3.5 mmol/L (ref 3.5–5.1)
Sodium: 131 mmol/L — ABNORMAL LOW (ref 135–145)

## 2016-07-29 MED ORDER — ALBUMIN HUMAN 25 % IV SOLN
100.0000 g | Freq: Once | INTRAVENOUS | Status: AC
  Administered 2016-07-30: 100 g via INTRAVENOUS
  Filled 2016-07-29: qty 400

## 2016-07-29 NOTE — Progress Notes (Signed)
Patient ID: Margy Clarkserry L Yeatts, male   DOB: 04-24-1956, 60 y.o.   MRN: [id number]  [facility] Progress Note   Assessment/ Plan:   1.Acute kidney injury: baseline with a creatinine of around 1.1-1.2. Suspected to have suffered hemodynamic mediated acute renal injury following large volume paracentesis-possibly complicated by intra-abdominal hypertension given rapid recurrence of ascites. Renal function back to baseline. 2. Hyponatremia: Secondary to cirrhosis and impaired water excretion with acute renal failure. Appears to be stable/unchanged overnight. 3. Anemia: Chronic and associated with cirrhosis/chronic disease. With significant iron deficiency-iron saturation of 4% and ferritin of 29: give intravenous iron today. 4. Decompensated alcoholic cirrhosis: Seen earlier by gastroenterology anticipation for TIPS [facility] with his current MELD-Na score of 31 and likelihood of worsening hepatic encephalopathy. Lactulose after patient refused further doses, suspect encephalopathy in part from SBP. 5.SBP: Ascitic fluid with 423 WBC (50% neutrophil) meeting criteria for peritonitis, continue intravenous Rocephin . 6. Hypokalemia: Secondary to stool losses/limited intake-repleted via oral route  Recommend lasix 40mg  and aldactone 100mg  daily at DC with titration by PCP/GI. Will s/o at this time.  Subjective:   Complains of painful swelling of right foot--had skin tear earlier.   Objective:   BP 109/71 (BP Location: Right Arm)   Pulse (!) 105   Temp 98.9 F (37.2 C) (Oral)   Resp 18   Ht 5\' 11"  (1.803 m)   Wt 98.4 kg (216 lb 14.4 oz)   SpO2 98%   BMI 30.25 kg/m   Intake/Output Summary (Last 24 hours) at 07/29/16 1103 Last data filed at 07/29/16 0900  Gross per 24 hour  Intake              720 ml  Output                0 ml  Net              720 ml   Weight change:   Physical Exam: MVH:[id number]:Comfortably resting in bed, appears fatigued CVS: Pulse  regular tachycardia, S1 and S2 with 3/6 ESM Resp: Decreased breath sounds over bases otherwise clear to auscultation Abd: soft, moderately distended with no tenderness over lower quadrants Ext: 1+ lower extremity edema  Imaging: Koreas Paracentesis  Result Date: 07/28/2016 INDICATION: Ascites secondary to alcoholic cirrhosis. Request for diagnostic and therapeutic paracentesis with a 9 liter limit. EXAM: ULTRASOUND GUIDED RIGHT LATERAL ABDOMEN PARACENTESIS MEDICATIONS: 1% Lidocaine. COMPLICATIONS: None immediate. PROCEDURE: Informed written consent was obtained from the patient after a discussion of the risks, benefits and alternatives to treatment. A timeout was performed prior to the initiation of the procedure. Initial ultrasound scanning demonstrates a large amount of ascites within the right lateral abdomen. The right lateral abdomen was prepped and draped in the usual sterile fashion. 1% lidocaine with epinephrine was used for local anesthesia. Following this, a 19 gauge, 7-cm, Yueh catheter was introduced. An ultrasound image was saved for documentation purposes. The paracentesis was performed. The catheter was removed and a dressing was applied. The patient tolerated the procedure well without immediate post procedural complication. FINDINGS: A total of approximately 9 liters of cloudy yellow fluid was removed. Samples were sent to the laboratory as requested by the clinical team. IMPRESSION: Successful ultrasound-guided paracentesis yielding 9 liters of peritoneal fluid. Read by:  Corrin ParkerWendy Blair, PA-C Electronically Signed   By: Gilmer MorJaime  Wagner D.O.   On: 07/28/2016 11:46    Labs: BMET  Recent Labs Lab 07/25/16 1750 07/26/16 0102  07/27/16 0512 07/28/16 0633 07/29/16 0519  NA 133* 133* 134* 133* 131*  K 4.7 4.3 3.4* 3.8 3.5  CL 105 105 106 104 102  CO2 20* 21* 21* 21* 21*  GLUCOSE 126* 135* 99 96 91  BUN 35* 34* 24* 22* 20  CREATININE 2.38* 2.28* 1.50* 1.36* 1.19  CALCIUM 8.9 8.6* 8.8*  8.6* 8.3*   CBC  Recent Labs Lab 07/25/16 1750 07/26/16 0102 07/27/16 0512 07/28/16 0633 07/29/16 0519  WBC 8.3 3.7* 5.0 9.2 8.7  NEUTROABS 4.6  --  3.4 6.4 6.0  HGB 7.3* 6.0* 7.7* 8.1* 7.9*  HCT 23.3* 18.6* 22.8* 24.1* 23.5*  MCV 75.9* 76.2* 78.4 77.2* 77.8*  PLT 194 120* 89* 127* 117*   Medications:    . cefTRIAXone (ROCEPHIN)  IV  2 g Intravenous Q24H  . erythromycin   Right Eye QHS  . feeding supplement (ENSURE ENLIVE)  237 mL Oral BID BM  . ferrous sulfate  325 mg Oral BID WC  . ferumoxytol  510 mg Intravenous Once  . lactulose  30 g Oral Daily  . rifaximin  550 mg Oral BID   Zetta BillsJay Seretha Estabrooks, MD 07/29/2016, 11:03 AM

## 2016-07-29 NOTE — Consult Note (Signed)
Consultation Note Date: 07/29/2016   Patient Name: Devin Noble  DOB: Sep 06, 1956  MRN: [id number]  Age / Sex: 60 y.o., male  PCP: No Pcp Per Patient Referring Physician: Osvaldo Shipper, MD  Reason for Consultation: Establishing goals of care and Psychosocial/spiritual support  HPI/Patient Profile: 60 y.o. male   admitted on 07/25/2016 withpast medical history significant for alcoholic cirrhosis, refractory ascites, and iron def anemia who presents with dyspnea and swelling.  Requiring frequent paracentesis to ascites control   Per family continued physical, functional and cognitive decline over the past months.  Patient is faced with his terminal diagnosis and limitations of life prolonging intervetnions, facing his own mortality, treatment option decisions and anticipatory care needs.   Clinical Assessment and Goals of Care:  This NP Lorinda Creed reviewed medical records, received report from team, assessed the patient and then meet at the patient's bedside along with Angie Noble-# # [phone] and Karen Kitchens Noble by telephone  to discuss diagnosis, prognosis, GOC, EOL wishes disposition and options.  Devin Noble; his previous long term SO and her children are his family.  They all love and support him.  They "are there for him".     A detailed discussion was had today regarding advanced directives.  Concepts specific to code status, artifical feeding and hydration, continued IV antibiotics and rehospitalization was had.  The difference between a aggressive medical intervention path  and a palliative comfort care path for this patient at this time was had.  Values and goals of care important to patient and family were attempted to be elicited.  MOST form introduced   Hard choices left with family for review  Concept of Hospice and Palliative Care were discussed  Natural trajectory and  expectations at EOL were discussed.  Questions and concerns addressed.   Family encouraged to call with questions or concerns.  PMT will continue to support holistically.     SUMMARY OF RECOMMENDATIONS    -patient and family remain hopeful for "more time".  He is open to continued paracentesis as long as it is logistically possible  -hopeful for short term rehab for physical therapy, recommend palliative to follow on discharge   - when patient returns home,  which is his ultimate goal, implement hospice services     Code Status/Advance Care Planning:  DNR  Symptom Management:   Weakness: physical therapy as tolerated and, SNF for short term rehab  Ascites: GI discussed with patient possibility of medication adjustments, paracentesis before discharge    Palliative Prophylaxis:   Aspiration, Delirium Protocol, Frequent Pain Assessment and Oral Care  Additional Recommendations (Limitations, Scope, Preferences):  Avoid Hospitalization  Psycho-social/Spiritual:   Desire for further Chaplaincy support:no  Additional Recommendations: Education on Hospice  Prognosis:   < 3 months at best  Discharge Planning: Hopeful for short term rehabilitation and then home with hospice     Primary Diagnoses: Present on Admission: . AKI (acute kidney injury) (HCC) . Alcoholic cirrhosis of liver with ascites (HCC) . Iron deficiency anemia due to chronic  blood loss . Protein-calorie malnutrition, severe . (Resolved) Hyponatremia . Fall . Peritonitis (HCC) . Encephalopathy, hepatic (HCC)   I have reviewed the medical record, interviewed the patient and family, and examined the patient. The following aspects are pertinent.  Past Medical History:  Diagnosis Date  . Abdominal hernia   . Arthritis   . Ascites   . Birthmark of skin    "right eye" (01/30/2013)  . Chronic hyponatremia   . Cirrhosis (HCC)   . Depression   . Esophageal varices (HCC)   . GERD (gastroesophageal  reflux disease)    seldom  . Hypertension    takes Lisinopril/HCTZ daily  . Orthostatic hypotension   . Pneumonia 2017  . Restless leg syndrome   . Shortness of breath dyspnea    "when I have toomuch fluid in my belly"   Social History   Social History  . Marital status: Single    Spouse name: N/A  . Number of children: 0  . Years of education: N/A   Occupational History  . Diability    Social History Main Topics  . Smoking status: Never Smoker  . Smokeless tobacco: Never Used  . Alcohol use No     Comment: No alcholol since 09/21/15  . Drug use: No  . Sexual activity: Not Currently   Other Topics Concern  . None   Social History Narrative   Worked previously as a Emergency planning/management officerschool bus mechanic. He was forced into retirement/fired. When he was unable to perform his work because of shoulder issues.   His social support system includes a 4570 something-year-old friend who does not drive and her children, most of whom work during the day. However one of these children is caring for the patient's dog while he is in the hospital.( Dated 09/2015)   Family History  Problem Relation Age of Onset  . Gallbladder disease Mother   . Colon cancer Neg Hx   . Colon polyps Neg Hx   . Kidney disease Neg Hx   . Diabetes Neg Hx   . Esophageal cancer Neg Hx   . Heart disease Neg Hx    Scheduled Meds: . cefTRIAXone (ROCEPHIN)  IV  2 g Intravenous Q24H  . erythromycin   Right Eye QHS  . feeding supplement (ENSURE ENLIVE)  237 mL Oral BID BM  . ferrous sulfate  325 mg Oral BID WC  . ferumoxytol  510 mg Intravenous Once  . lactulose  30 g Oral Daily  . rifaximin  550 mg Oral BID   Continuous Infusions:  PRN Meds:.acetaminophen **OR** acetaminophen, hydrOXYzine, ondansetron **OR** ondansetron (ZOFRAN) IV, oxyCODONE Medications Prior to Admission:  Prior to Admission medications   Medication Sig Start Date End Date Taking? Authorizing Provider  ferrous sulfate 325 (65 FE) MG tablet Take 325 mg by  mouth daily with breakfast.   Yes Historical Provider, MD  furosemide (LASIX) 20 MG tablet TAKE 3 TABLETS (60 MG TOTAL) BY MOUTH DAILY. 07/05/16  Yes Hilarie FredricksonJohn N Marcelo, MD  lactulose (CHRONULAC) 10 GM/15ML solution Take 30 mLs by mouth 4 (four) times daily. 06/07/16  Yes Historical Provider, MD  hydrOXYzine (VISTARIL) 25 MG capsule Take 25 mg by mouth at bedtime as needed for sleep. 06/08/16   Historical Provider, MD  spironolactone (ALDACTONE) 100 MG tablet Take 1 tablet (100 mg total) by mouth daily. Patient not taking: Reported on 07/25/2016 04/21/16   Haydee SalterPhillip M Hobbs, MD   Allergies  Allergen Reactions  . Tramadol Hives and Itching  Review of Systems  Constitutional: Positive for activity change and fatigue.  Gastrointestinal: Positive for abdominal distention.  Neurological: Positive for weakness.    Physical Exam  Constitutional: He is oriented to person, place, and time. He appears cachectic. He appears ill.  HENT:  Mouth/Throat: Oropharynx is clear and moist.  Cardiovascular: Tachycardia present.   Neurological: He is alert and oriented to person, place, and time.  Skin: Skin is warm and dry.    Vital Signs: BP 109/71 (BP Location: Right Arm)   Pulse (!) 105   Temp 98.9 F (37.2 C) (Oral)   Resp 18   Ht 5\' 11"  (1.803 m)   Wt 98.4 kg (216 lb 14.4 oz)   SpO2 98%   BMI 30.25 kg/m  Pain Assessment: No/denies pain   Pain Score: 0-No pain   SpO2: SpO2: 98 % O2 Device:SpO2: 98 % O2 Flow Rate: .   IO: Intake/output summary:  Intake/Output Summary (Last 24 hours) at 07/29/16 1020 Last data filed at 07/29/16 0900  Gross per 24 hour  Intake              720 ml  Output                0 ml  Net              720 ml    LBM: Last BM Date: 07/28/16 Baseline Weight: Weight: 98.1 kg (216 lb 4.3 oz) Most recent weight: Weight: 98.4 kg (216 lb 14.4 oz)      Palliative Assessment/Data: 30 % at best   Discussed with Dr Rito EhrlichKrishnan  Time In: 0915 Time Out: 1030 Time Total: 75  min Greater than 50%  of this time was spent counseling and coordinating care related to the above assessment and plan.  Signed by: Lorinda CreedLARACH, Desere Gwin, NP   Please contact Palliative Medicine Team phone at [phone] for questions and concerns.  For individual provider: See Loretha StaplerAmion

## 2016-07-29 NOTE — Progress Notes (Signed)
Upon initial assessment, mild redness, pain and swelling appeared on the top of the right foot.  Within two hours the pain, swelling and redness had increased substantially.  A protective foam and gauze was applied.  Patient tolerated well.    Tacy Duraachel Dunia Pringle, Student Nurse

## 2016-07-29 NOTE — Care Management Important Message (Signed)
Important Message  Patient Details  Name: Devin Noble MRN: [id number] Date of Birth: [birth year]   Medicare Important Message Given:  Yes    [name] Stefan ChurchBratton 07/29/2016, 9:47 AM

## 2016-07-29 NOTE — Consult Note (Signed)
Daily Rounding Note  07/29/2016, 10:01 AM  LOS: 4 days   SUBJECTIVE:   Chief complaint: painful swelling in right foot.  Denies n/v, abdominal pain.  Belly starting to regain swelling and increased girth.  No Dyspnea.  Urine output of 0 recorded for 11/1.  Not clear how well staff is able to measure urine output.   OBJECTIVE:         Vital signs in last 24 hours:    Temp:  [97.3 F (36.3 C)-98.9 F (37.2 C)] 98.9 F (37.2 C) (11/02 0945) Pulse Rate:  [98-105] 105 (11/02 0945) Resp:  [18-19] 18 (11/02 0945) BP: (94-111)/(56-83) 109/71 (11/02 0945) SpO2:  [94 %-98 %] 98 % (11/02 0945) Last BM Date: 07/28/16 Filed Weights   07/25/16 2343 07/26/16 2059 07/27/16 2221  Weight: 98.1 kg (216 lb 4.3 oz) 98 kg (216 lb 0.8 oz) 98.4 kg (216 lb 14.4 oz)   General: alert, comfortable.  Looks ill.   Heart: RRR Chest: crackles in right base, diminished BS on left Abdomen: still soft but more protuberant than post tap yesterday.   Extremities: right foot dorsum with tender swelling and 10 Cm of focal erythema with tiny linear closed lesion  Neuro/Psych:  Oriented x 3.  Appropriate.  Asterixis only slight: improved.   Intake/Output from previous day: 11/01 0701 - 11/02 0700 In: 720 [P.O.:720] Out: 0   Intake/Output this shift: Total I/O In: 240 [P.O.:240] Out: -   Lab Results:  Recent Labs  07/27/16 0512 07/28/16 0633 07/29/16 0519  WBC 5.0 9.2 8.7  HGB 7.7* 8.1* 7.9*  HCT 22.8* 24.1* 23.5*  PLT 89* 127* 117*   BMET  Recent Labs  07/27/16 0512 07/28/16 0633 07/29/16 0519  NA 134* 133* 131*  K 3.4* 3.8 3.5  CL 106 104 102  CO2 21* 21* 21*  GLUCOSE 99 96 91  BUN 24* 22* 20  CREATININE 1.50* 1.36* 1.19  CALCIUM 8.8* 8.6* 8.3*   LFT  Recent Labs  07/27/16 0512 07/28/16 0633  PROT 5.9* 5.7*  ALBUMIN 3.9 3.3*  AST 19 25  ALT 11* 14*  ALKPHOS 51 49  BILITOT 4.7* 3.3*  BILIDIR 1.4* 1.0*  IBILI 3.3*  2.3*   PT/INR No results for input(s): LABPROT, INR in the last 72 hours. Hepatitis Panel No results for input(s): HEPBSAG, HCVAB, HEPAIGM, HEPBIGM in the last 72 hours.  Studies/Results: Koreas Paracentesis  Result Date: 07/28/2016 INDICATION: Ascites secondary to alcoholic cirrhosis. Request for diagnostic and therapeutic paracentesis with a 9 liter limit. EXAM: ULTRASOUND GUIDED RIGHT LATERAL ABDOMEN PARACENTESIS MEDICATIONS: 1% Lidocaine. COMPLICATIONS: None immediate. PROCEDURE: Informed written consent was obtained from the patient after a discussion of the risks, benefits and alternatives to treatment. A timeout was performed prior to the initiation of the procedure. Initial ultrasound scanning demonstrates a large amount of ascites within the right lateral abdomen. The right lateral abdomen was prepped and draped in the usual sterile fashion. 1% lidocaine with epinephrine was used for local anesthesia. Following this, a 19 gauge, 7-cm, Yueh catheter was introduced. An ultrasound image was saved for documentation purposes. The paracentesis was performed. The catheter was removed and a dressing was applied. The patient tolerated the procedure well without immediate post procedural complication. FINDINGS: A total of approximately 9 liters of cloudy yellow fluid was removed. Samples were sent to the laboratory as requested by the clinical team. IMPRESSION: Successful ultrasound-guided paracentesis yielding 9 liters of peritoneal fluid. Read by:  Corrin ParkerWendy Blair, PA-C Electronically Signed   By: Gilmer MorJaime  Wagner D.O.   On: 07/28/2016 11:46   Scheduled Meds: . cefTRIAXone (ROCEPHIN)  IV  2 g Intravenous Q24H  . erythromycin   Right Eye QHS  . feeding supplement (ENSURE ENLIVE)  237 mL Oral BID BM  . ferrous sulfate  325 mg Oral BID WC  . ferumoxytol  510 mg Intravenous Once  . lactulose  30 g Oral Daily  . rifaximin  550 mg Oral BID   Continuous Infusions:  PRN Meds:.acetaminophen **OR** acetaminophen,  hydrOXYzine, ondansetron **OR** ondansetron (ZOFRAN) IV, oxyCODONE   ASSESMENT:   * Decompensated alcoholic cirrhosis. MELD-Na 31.  Was to meet eith  [facility] liver[facility] in [place] this week.   * Refractory ascites requiring large volume paracentesis. Diuretic dosing has been challenging due to AKI. Many recent 9 liter paracentesis.  Already re accumulating ascites after latest tap of 11/1.  SBP on tap of 10/29, Rocephin day 4.    * AKI. Diuretics on hold.  Labs improved.  Renal ultrasound with large ascites, left pleural effusion. Renal added additional doses of Albumin through 11/1.   *Coagulopathy.   * Hyponatremia, non-critical  * AMS, hepatic encephalopathy:  persists. Having at least 3 daily BMs.  Lacutlose adjusted to q AM as he refused additional doses.  Rifaxamin day 3. Suspect SBP also playing role: on Rocephin.   * Recent falls with minor elbow and right orbital trauma.   *  Painful swelling and ? Focal cellulitis on dorsal right foot, this despite the Rocephin.   * Iron deficiency anemia. Not clear that he's taking iron twice a day as prescribed. s/p PRBC x 2/  PO iron resumed, S/p single dose Parenteral iron. .   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   * Thrombocytopenia.   * Protein calorie malnutrition.Ensure in place   *  Debilitated.     PLAN   *  At current rate, pt will need another paracentesis by 11/4.     Jennye MoccasinSarah Gribbin  07/29/2016, 10:01 AM Pager: [phone]     Attending physician's note   I have taken an interval history, reviewed the chart and examined the patient. I agree with the Advanced Practitioner's note, impression and recommendations. Palliative Care consult in progress. Pt strongly considering hospice. Ascites has reaccumulated rapidly. Recommend repeat large volume paracentesis tomorrow with albumin and then consider discharge.  SBP treated as ascites neutrophil count has decreased  to 30. Discontinue IV Rocephin tomorrow and begin Cipro 500 mg daily long term for SBP prophylaxis. AKI resolved. Query renal consult for diuretic recommendations and ? use of midodrine for mgmt of ascites.   Claudette HeadMalcolm Enos Muhl, MD Clementeen GrahamFACG [phone] Mon-Fri 8a-5p [phone] after 5p, weekends, holidays

## 2016-07-29 NOTE — Progress Notes (Signed)
[facility] PROGRESS NOTE  Devin Noble UXL:[id number] DOB: [birth year] DOA: 07/25/2016  PCP: No PCP Per Patient  Brief History/Interval Summary: 60 year old Caucasian male with a past medical history of alcoholic liver cirrhosis, refractory ascites with multiple paracentesis in the past, iron deficiency anemia, presented with shortness of breath and abdominal distention. Patient was hospitalized. He underwent paracentesis. Fluid analysis raised concern for SBP. Patient was seen by gastroenterology. He subsequently developed acute renal failure and nephrology was also consulted.  Reason for Visit: Acute renal failure and ascites  Consultants: Gastroenterology. Nephrology. Palliative medicine.  Procedures: Paracentesis 2  Antibiotics: Ceftriaxone  Subjective/Interval History: Patient feels better overall. Less distended in his abdomen compared to yesterday. Breathing is improved. Denies any chest pain.   ROS: Denies any nausea or vomiting.  Objective:  Vital Signs  Vitals:   07/28/16 1726 07/28/16 1800 07/29/16 0543 07/29/16 0945  BP: 102/66 101/62 102/72 109/71  Pulse: 99 98 98 (!) 105  Resp: 18 18 19 18   Temp: 97.8 F (36.6 C) 97.5 F (36.4 C) 98.4 F (36.9 C) 98.9 F (37.2 C)  TempSrc: Oral Oral Oral Oral  SpO2: 95% 94% 97% 98%  Weight:      Height:        Intake/Output Summary (Last 24 hours) at 07/29/16 0956 Last data filed at 07/29/16 0900  Gross per 24 hour  Intake              720 ml  Output                0 ml  Net              720 ml   Filed Weights   07/25/16 2343 07/26/16 2059 07/27/16 2221  Weight: 98.1 kg (216 lb 4.3 oz) 98 kg (216 lb 0.8 oz) 98.4 kg (216 lb 14.4 oz)    General appearance: alert, cooperative, appears stated age and no distress Resp: Normal effort noted today. Diminished air entry at the bases without any definite crackles, wheezing or rhonchi. Cardio: regular rate and rhythm, S1, S2 normal, no murmur, click, rub or  gallop GI: Abdomen is less distended. Tender diffusely without any rebound or guarding. No ascites is present. Extremities: Mild erythema noted over the dorsal aspect of the right foot. Dressing has been applied.  Neurologic: Awake and alert. Mildly distracted. No focal neurological deficits.  Lab Results:  Data Reviewed: I have personally reviewed following labs and imaging studies  CBC:  Recent Labs Lab 07/25/16 1750 07/26/16 0102 07/27/16 0512 07/28/16 0633 07/29/16 0519  WBC 8.3 3.7* 5.0 9.2 8.7  NEUTROABS 4.6  --  3.4 6.4 6.0  HGB 7.3* 6.0* 7.7* 8.1* 7.9*  HCT 23.3* 18.6* 22.8* 24.1* 23.5*  MCV 75.9* 76.2* 78.4 77.2* 77.8*  PLT 194 120* 89* 127* 117*    Basic Metabolic Panel:  Recent Labs Lab 07/25/16 1750 07/26/16 0102 07/27/16 0512 07/28/16 0633 07/29/16 0519  NA 133* 133* 134* 133* 131*  K 4.7 4.3 3.4* 3.8 3.5  CL 105 105 106 104 102  CO2 20* 21* 21* 21* 21*  GLUCOSE 126* 135* 99 96 91  BUN 35* 34* 24* 22* 20  CREATININE 2.38* 2.28* 1.50* 1.36* 1.19  CALCIUM 8.9 8.6* 8.8* 8.6* 8.3*    GFR: Estimated Creatinine Clearance: 79.9 mL/min (by C-G formula based on SCr of 1.19 mg/dL).  Liver Function Tests:  Recent Labs Lab 07/25/16 1750 07/26/16 0102 07/27/16 0512 07/28/16 0633  AST 22 18 19  25  ALT 17 13* 11* 14*  ALKPHOS 80 64 51 49  BILITOT 2.8* 1.8* 4.7* 3.3*  PROT 6.8 6.3* 5.9* 5.7*  ALBUMIN 2.9* 3.5 3.9 3.3*     Recent Labs Lab 07/25/16 1750  LIPASE 34    Recent Labs Lab 07/26/16 0102  AMMONIA 73*    Coagulation Profile:  Recent Labs Lab 07/26/16 0102  INR 2.34     Recent Results (from the past 240 hour(s))  Culture, body fluid-bottle     Status: None (Preliminary result)   Collection Time: 07/25/16  7:45 PM  Result Value Ref Range Status   Specimen Description BLOOD PERITONEAL  Final   Special Requests BOTTLES DRAWN AEROBIC ONLY 5CC  Final   Culture NO GROWTH 3 DAYS  Final   Report Status PENDING  Incomplete  Gram  stain     Status: None   Collection Time: 07/25/16  7:45 PM  Result Value Ref Range Status   Specimen Description PERITONEAL  Final   Special Requests NONE  Final   Gram Stain   Final    MODERATE WBC PRESENT,BOTH PMN AND MONONUCLEAR NO ORGANISMS SEEN    Report Status 07/26/2016 FINAL  Final  Culture, blood (routine x 2)     Status: None (Preliminary result)   Collection Time: 07/26/16  6:38 PM  Result Value Ref Range Status   Specimen Description BLOOD LEFT ANTECUBITAL  Final   Special Requests BOTTLES DRAWN AEROBIC AND ANAEROBIC 6CC  Final   Culture NO GROWTH 3 DAYS  Final   Report Status PENDING  Incomplete  Culture, blood (routine x 2)     Status: None (Preliminary result)   Collection Time: 07/26/16  6:50 PM  Result Value Ref Range Status   Specimen Description BLOOD RIGHT HAND  Final   Special Requests BOTTLES DRAWN AEROBIC AND ANAEROBIC 5CC  Final   Culture NO GROWTH 3 DAYS  Final   Report Status PENDING  Incomplete      Radiology Studies: Koreas Paracentesis  Result Date: 07/28/2016 INDICATION: Ascites secondary to alcoholic cirrhosis. Request for diagnostic and therapeutic paracentesis with a 9 liter limit. EXAM: ULTRASOUND GUIDED RIGHT LATERAL ABDOMEN PARACENTESIS MEDICATIONS: 1% Lidocaine. COMPLICATIONS: None immediate. PROCEDURE: Informed written consent was obtained from the patient after a discussion of the risks, benefits and alternatives to treatment. A timeout was performed prior to the initiation of the procedure. Initial ultrasound scanning demonstrates a large amount of ascites within the right lateral abdomen. The right lateral abdomen was prepped and draped in the usual sterile fashion. 1% lidocaine with epinephrine was used for local anesthesia. Following this, a 19 gauge, 7-cm, Yueh catheter was introduced. An ultrasound image was saved for documentation purposes. The paracentesis was performed. The catheter was removed and a dressing was applied. The patient  tolerated the procedure well without immediate post procedural complication. FINDINGS: A total of approximately 9 liters of cloudy yellow fluid was removed. Samples were sent to the laboratory as requested by the clinical team. IMPRESSION: Successful ultrasound-guided paracentesis yielding 9 liters of peritoneal fluid. Read by:  Corrin ParkerWendy Blair, PA-C Electronically Signed   By: Gilmer MorJaime  Wagner D.O.   On: 07/28/2016 11:46     Medications:  Scheduled: . cefTRIAXone (ROCEPHIN)  IV  2 g Intravenous Q24H  . erythromycin   Right Eye QHS  . feeding supplement (ENSURE ENLIVE)  237 mL Oral BID BM  . ferrous sulfate  325 mg Oral BID WC  . ferumoxytol  510 mg Intravenous Once  . lactulose  30 g Oral Daily  . rifaximin  550 mg Oral BID   Continuous:  ZOX:[id number] **OR** acetaminophen, hydrOXYzine, ondansetron **OR** ondansetron (ZOFRAN) IV, oxyCODONE  Assessment/Plan:  Principal Problem:   Peritonitis (HCC) Active Problems:   Protein-calorie malnutrition, severe   Iron deficiency anemia due to chronic blood loss   Alcoholic cirrhosis of liver with ascites (HCC)   Fall   AKI (acute kidney injury) (HCC)   Encephalopathy, hepatic (HCC)   SBP (spontaneous bacterial peritonitis) (HCC)    Spontaneous bacterial Peritonitis  Patient is on ceftriaxone, which will be continued. Gastroenterology is following. Paracentesis was repeated yesterday. Cell counts have improved. Cultures from earlier paracentesis and negative. Anticipate could change him to oral antibiotics soon.   Acute kidney injury  Patient's baseline creatinine is around 1.08. Patient had worsening of his creatinine after paracentesis. Concern was for hepatorenal syndrome. The patient was on diuretics at home. Patient was seen by nephrology. Albumin was initiated. Renal function has improved. Renal function is back to normal today. Renal ultrasound was negative for any renal abnormalities. Discussed with nephrology today. Continue current  management. At discharge, he will need to be placed on 40 mg of Lasix and 100 mg of spironolactone with close outpatient monitoring of labs.  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 Gastroenterology is following. Patient is status post paracentesis 2 during this hospitalization. Patient is on albumin. Deferred [facility] to [facility] liver care. Overall prognosis appears to be poor. Palliative medicine consult was recommended by gastroenterology. Palliative medicine has seen the patient and will discuss with patient and other significant others in his life later today.  Hepatic encephalopathy Ammonia 73. Lactulose TID, rifaximin added. Patient has been refusing to take lactulose as he does not want to have loose stools. Have continued to encourage him to take this medication.   Iron deficiency anemia  No report of recent hematochezia or melena. GI suspects chronic blood loss from gastropathy.Chronically anemic with hgb in 7s. S/p 2u pRBC on 10/30. Continue PO iron supplementation.  Severe protein calorie malnutrition Nutrition eval  Fall and eye bruise Fall from deconditioning in ESLD also likely hepatic encephalopathy. Has also has had some orthostasis recurrently this year from diuretics. PT has recommended SNF. Patient uncertain.  Right foot cellulitis. Local wound care. Continue antibiotics. Monitor closely.   DVT Prophylaxis: SCDs    Code Status: DO NOT RESUSCITATE  Family Communication: No family is available. Apparently, patient has some friends who are involved in taking care of him at home. Palliative medicine to meet with them today. Disposition Plan: Continue to monitor. Management as outlined above.     LOS: 4 days   [facility]  [facility] Pager [phone] 07/29/2016, 9:56 AM  If 7PM-7AM, please contact night-coverage  at [email or web address], password [facility]

## 2016-07-30 ENCOUNTER — Inpatient Hospital Stay (HOSPITAL_COMMUNITY): Payer: Medicare Other

## 2016-07-30 ENCOUNTER — Other Ambulatory Visit: Payer: Self-pay | Admitting: Physician Assistant

## 2016-07-30 ENCOUNTER — Telehealth: Payer: Self-pay | Admitting: Internal Medicine

## 2016-07-30 DIAGNOSIS — N289 Disorder of kidney and ureter, unspecified: Secondary | ICD-10-CM

## 2016-07-30 DIAGNOSIS — D649 Anemia, unspecified: Secondary | ICD-10-CM

## 2016-07-30 LAB — CBC WITH DIFFERENTIAL/PLATELET
BASOS ABS: 0.1 10*3/uL (ref 0.0–0.1)
Basophils Relative: 1 %
EOS ABS: 0.9 10*3/uL — AB (ref 0.0–0.7)
Eosinophils Relative: 11 %
HCT: 25.2 % — ABNORMAL LOW (ref 39.0–52.0)
Hemoglobin: 8.4 g/dL — ABNORMAL LOW (ref 13.0–17.0)
LYMPHS ABS: 0.6 10*3/uL — AB (ref 0.7–4.0)
Lymphocytes Relative: 8 %
MCH: 26.3 pg (ref 26.0–34.0)
MCHC: 33.3 g/dL (ref 30.0–36.0)
MCV: 78.8 fL (ref 78.0–100.0)
Monocytes Absolute: 1.3 10*3/uL — ABNORMAL HIGH (ref 0.1–1.0)
Monocytes Relative: 16 %
NEUTROS ABS: 5.1 10*3/uL (ref 1.7–7.7)
Neutrophils Relative %: 64 %
PLATELETS: 145 10*3/uL — AB (ref 150–400)
RBC: 3.2 MIL/uL — AB (ref 4.22–5.81)
RDW: 23.9 % — AB (ref 11.5–15.5)
WBC: 8 10*3/uL (ref 4.0–10.5)

## 2016-07-30 LAB — BASIC METABOLIC PANEL
ANION GAP: 6 (ref 5–15)
BUN: 17 mg/dL (ref 6–20)
CHLORIDE: 102 mmol/L (ref 101–111)
CO2: 22 mmol/L (ref 22–32)
CREATININE: 1.15 mg/dL (ref 0.61–1.24)
Calcium: 8.1 mg/dL — ABNORMAL LOW (ref 8.9–10.3)
GFR calc non Af Amer: >60 mL/min (ref >60)
Glucose, Bld: 82 mg/dL (ref 65–99)
POTASSIUM: 3.6 mmol/L (ref 3.5–5.1)
Sodium: 130 mmol/L — ABNORMAL LOW (ref 135–145)

## 2016-07-30 MED ORDER — ERYTHROMYCIN 5 MG/GM OP OINT: TOPICAL_OINTMENT | Freq: Every day | OPHTHALMIC | 0 refills | Status: AC

## 2016-07-30 MED ORDER — SPIRONOLACTONE 100 MG PO TABS: 100.0000 mg | ORAL_TABLET | Freq: Every day | ORAL | 0 refills | Status: AC

## 2016-07-30 MED ORDER — MIDODRINE HCL 5 MG PO TABS: 5.0000 mg | ORAL_TABLET | Freq: Two times a day (BID) | ORAL | 1 refills | Status: AC

## 2016-07-30 MED ORDER — RIFAXIMIN 550 MG PO TABS: 550.0000 mg | ORAL_TABLET | Freq: Two times a day (BID) | ORAL | 0 refills | Status: AC

## 2016-07-30 MED ORDER — LIDOCAINE HCL 1 % IJ SOLN
INTRAMUSCULAR | Status: AC
  Filled 2016-07-30: qty 20

## 2016-07-30 MED ORDER — CIPROFLOXACIN HCL 500 MG PO TABS
500.0000 mg | ORAL_TABLET | Freq: Every day | ORAL | Status: DC
  Administered 2016-07-30: 500 mg via ORAL
  Filled 2016-07-30: qty 1

## 2016-07-30 MED ORDER — FUROSEMIDE 20 MG PO TABS: 40.0000 mg | ORAL_TABLET | Freq: Every day | ORAL | 1 refills | Status: AC

## 2016-07-30 MED ORDER — FUROSEMIDE 40 MG PO TABS
40.0000 mg | ORAL_TABLET | Freq: Every day | ORAL | Status: DC
  Administered 2016-07-30: 40 mg via ORAL
  Filled 2016-07-30: qty 1

## 2016-07-30 MED ORDER — MIDODRINE HCL 5 MG PO TABS
5.0000 mg | ORAL_TABLET | Freq: Two times a day (BID) | ORAL | Status: DC
  Administered 2016-07-30: 5 mg via ORAL
  Filled 2016-07-30: qty 1

## 2016-07-30 MED ORDER — CIPROFLOXACIN HCL 500 MG PO TABS: 500.0000 mg | ORAL_TABLET | Freq: Every day | ORAL | 1 refills | Status: AC

## 2016-07-30 MED ORDER — SPIRONOLACTONE 50 MG PO TABS
50.0000 mg | ORAL_TABLET | Freq: Two times a day (BID) | ORAL | Status: DC
  Administered 2016-07-30: 50 mg via ORAL
  Filled 2016-07-30: qty 1

## 2016-07-30 NOTE — Progress Notes (Signed)
Devin ClarksPerry L Noble to be D/C'd home per MD order.  Discussed prescriptions and follow up appointments with the patient. Prescriptions given to patient, medication list explained in detail. Pt verbalized understanding.    Medication List    TAKE these medications   ciprofloxacin 500 MG tablet Commonly known as:  CIPRO Take 1 tablet (500 mg total) by mouth daily with breakfast.   erythromycin ophthalmic ointment Place into the right eye at bedtime.   ferrous sulfate 325 (65 FE) MG tablet Take 325 mg by mouth daily with breakfast.   furosemide 20 MG tablet Commonly known as:  LASIX Take 2 tablets (40 mg total) by mouth daily. What changed:  how much to take   hydrOXYzine 25 MG capsule Commonly known as:  VISTARIL Take 25 mg by mouth at bedtime as needed for sleep.   lactulose 10 GM/15ML solution Commonly known as:  CHRONULAC Take 30 mLs by mouth 4 (four) times daily.   midodrine 5 MG tablet Commonly known as:  PROAMATINE Take 1 tablet (5 mg total) by mouth 2 (two) times daily with a meal.   rifaximin 550 MG Tabs tablet Commonly known as:  XIFAXAN Take 1 tablet (550 mg total) by mouth 2 (two) times daily.   spironolactone 100 MG tablet Commonly known as:  ALDACTONE Take 1 tablet (100 mg total) by mouth daily.       Vitals:   07/30/16 1413 07/30/16 1440  BP: 98/62 99/63  Pulse: (!) 102 (!) 102  Resp: 20 20  Temp: 98.3 F (36.8 C) 98.2 F (36.8 C)    Skin clean, dry and intact without evidence of skin break down, no evidence of skin tears noted. IV catheter discontinued intact. Site without signs and symptoms of complications. Dressing and pressure applied. Pt denies pain at this time. No complaints noted.  An After Visit Summary was printed and given to the patient. Do Not Resuscitate Form given to pt. To take home per MD order.  Patient escorted via WC, and D/C home via private auto.  Britt BologneseAnisha Mabe RN, BSN

## 2016-07-30 NOTE — Progress Notes (Signed)
[facility] Hospital Liaison Notified by [facility] of [facility] request [facility] [place] [facility] at [place] after discharge.  Chart and patient information currently under review to confirm hospice eligibility.  Spoke with patient and friend, Neysa BonitoChristy, at bedside to initiate education related to hospice philosophy, services and team approach to care.  Pt and Christy verbalized understanding of the information provided.  Per discussion, plan is for discharge to home by personal vehicle today.    Please send signed completed DNR form home with patient. Patient will need prescriptions for discharge medications that he does not currently have in the home. Completed discharge summary will need to be faxed to [facility] at [phone] when final. Please notify [facility] when patient is ready to leave unit at discharge--call [phone].    DME needs discussed and patient requested transfer bench with tub seat and bedside commode.  This Clinical research associatewriter [facility].    The home address has been verified and is correct in the chart.  Bobbie, significant other, to be contacted to arrange delivery of dme.  [facility] aware of the above.  [facility] information and contact numbers have been given to [place]y, at the bedside.    Above information shared with Marylene Landngela, [facility].  Thank you, Kristine GarbeWendy Slingerland, RN, BSN [phone]

## 2016-07-30 NOTE — Telephone Encounter (Signed)
is wanting to know if Dr. Marina GoodellPerry will be the attending physician for this patient. She states that patient is requesting that Dr. Marina GoodellPerry be attending physician. If so, would he like to use standing orders. If callback is after 6pm, please call [phone]. She states that patient is being discharged from hospital today

## 2016-07-30 NOTE — Progress Notes (Signed)
Physical Therapy Treatment Patient Details Name: Devin Noble MRN: [id number] DOB: 23-May-1956 Today's Date: 07/30/2016    History of Present Illness Pt is a 60 y/o male admitted secondary to abdominal swelling and dyspnea. PMH including but not limited to alcoholic cirrohsis, HTN and L TSA in 2014.    PT Comments    Managing mobility well enough to go home with hospice/palliative service.  Discussed typical level of activity progression/maintenance.  Follow Up Recommendations  Other (comment) (home with hospice/palliative services)     Equipment Recommendations  None recommended by PT    Recommendations for Other Services       Precautions / Restrictions Precautions Precautions: Fall    Mobility  Bed Mobility               General bed mobility comments: already OOB  Transfers Overall transfer level: Needs assistance Equipment used: Rolling walker (2 wheeled) Transfers: Sit to/from Stand Sit to Stand: Min guard         General transfer comment: cues for hand placement /safety  Ambulation/Gait Ambulation/Gait assistance: Min guard Ambulation Distance (Feet): 300 Feet Assistive device: Rolling walker (2 wheeled) Gait Pattern/deviations: Step-through pattern Gait velocity: decreased.  Difficult for pt to increase speed Gait velocity interpretation: Below normal speed for age/gender General Gait Details: generally steady, but demonstrates fatiguability and necessary use of the RW   Stairs Stairs: Yes Stairs assistance: Min guard Stair Management: One rail Left;Step to pattern;Forwards Number of Stairs: 4 General stair comments: steady and safe with rail  Wheelchair Mobility    Modified Rankin (Stroke Patients Only)       Balance     Sitting balance-Leahy Scale: Fair       Standing balance-Leahy Scale: Poor Standing balance comment: reliant on the RW                    Cognition Arousal/Alertness: Awake/alert Behavior During  Therapy: WFL for tasks assessed/performed;Flat affect Overall Cognitive Status: Impaired/Different from baseline Area of Impairment: Memory;Safety/judgement;Awareness         Safety/Judgement: Decreased awareness of safety;Decreased awareness of deficits          Exercises      General Comments        Pertinent Vitals/Pain Pain Assessment: No/denies pain    Home Living                      Prior Function            PT Goals (current goals can now be found in the care plan section) Acute Rehab PT Goals PT Goal Formulation: With patient/family Time For Goal Achievement: 08/11/16 Potential to Achieve Goals: Good Progress towards PT goals: Progressing toward goals    Frequency    Min 3X/week      PT Plan Discharge plan needs to be updated    Co-evaluation             End of Session   Activity Tolerance: Patient tolerated treatment well;Other (comment) (not limited by, but subject to fatigue) Patient left: in chair;with family/visitor present     Time: 1539-1600 PT Time Calculation (min) (ACUTE ONLY): 21 min  Charges:  $Gait Training: 8-22 mins                    G Codes:      Ferne Ellingwood, Eliseo GumKenneth V 07/30/2016, 4:53 PM 07/30/2016  [place] BingKen Benjimin Hadden, PT [phone]  (pager)

## 2016-07-30 NOTE — Telephone Encounter (Signed)
I am not the attending physician, his PCP is. I will continue to help him with and manage his liver related issues but not manage his non-GI issues. Any home health or hospice paperwork should go through his PCP

## 2016-07-30 NOTE — Progress Notes (Signed)
D/c summary faxed by CM. Gae GallopAngela Chord Takahashi RN,BSN,CM

## 2016-07-30 NOTE — Telephone Encounter (Signed)
Advised Hospice of this.

## 2016-07-30 NOTE — Procedures (Signed)
   US guided RLQ paracentesis  6 liter cloudy yellow fluid  Tolerated well

## 2016-07-30 NOTE — Discharge Summary (Signed)
[facility]  Physician Discharge Summary   Patient ID: Devin Noble MRN: [id number] DOB/AGE: 1956-07-16 60 y.o.  Admit date: 07/25/2016 Discharge date: 07/30/2016  PCP: No PCP Per Patient  DISCHARGE DIAGNOSES:  Principal Problem:   Peritonitis (HCC) Active Problems:   Protein-calorie malnutrition, severe   Iron deficiency anemia due to chronic blood loss   Alcoholic cirrhosis of liver with ascites (HCC)   Fall   AKI (acute kidney injury) (HCC)   Encephalopathy, hepatic (HCC)   SBP (spontaneous bacterial peritonitis) (HCC)   DNR (do not resuscitate)   Palliative care by specialist   RECOMMENDATIONS FOR OUTPATIENT FOLLOW UP: 1. Hospice to follow the patient at home 2. Gastroenterology to arrange outpatient paracentesis   DISCHARGE CONDITION: fair  Diet recommendation: As before  [facility] Weights   07/26/16 2059 07/27/16 2221 07/29/16 2033  Weight: 98 kg (216 lb 0.8 oz) 98.4 kg (216 lb 14.4 oz) 98.5 kg (217 lb 2.5 oz)    INITIAL HISTORY: 60 year old Caucasian male with a past medical history of alcoholic liver cirrhosis, refractory ascites with multiple paracentesis in the past, iron deficiency anemia, presented with shortness of breath and abdominal distention. Patient was hospitalized. He underwent paracentesis. Fluid analysis raised concern for SBP. Patient was seen by gastroenterology. He subsequently developed acute renal failure and nephrology was also consulted.  Consultants: Gastroenterology. Nephrology. Palliative medicine.  Procedures: Paracentesis 3  Antibiotics: Ceftriaxone  HOSPITAL COURSE:   Spontaneous bacterial Peritonitis  Patient underwent paracentesis. Cell count suggested SBP. Patient was started on ceftriaxone. Gastroenterology was consulted. Paracentesis was repeated and repeat cell count show improvement. He will be transitioned to oral ciprofloxacin, which she will need to take for long-term per GI.    Acute kidney injury    Patient's baseline creatinine is around 1.08. Patient had worsening of his creatinine after paracentesis. Concern was for hepatorenal syndrome. The patient was on diuretics at home. Patient was seen by nephrology. Albumin was initiated. Renal function has improved. Renal function is back to normal. Renal ultrasound was negative for any renal abnormalities. Discussed with nephrology regarding diuretics at home. At discharge, he will need to be placed on 40 mg of Lasix and 100 mg of spironolactone with close outpatient monitoring of labs. He has been noted to have borderline low blood pressures. So, he will also be discharged on midodrine.  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  Patient is status post paracentesis 3 during this hospitalization. He was given albumin infusions post paracentesis. Gi deferred TIPS to [facility] liver care. Overall prognosis appears to be poor. Palliative medicine consult was recommended by gastroenterology. Palliative medicine has seen the patient and after discussions with the patient and family and friends who take care of him, it has been decided that he will now go home with hospice services.   Hepatic encephalopathy Ammonia was 73. Lactulose TID, rifaximin added. Patient has been refusing to take lactulose as he does not want to have loose stools. Have continued to encourage him to take this medication. He may be unable to afford rifaximin. We will defer to GI as outpatient.  Iron deficiency anemia  No report of recent hematochezia or melena. GI suspects chronic blood loss from gastropathy.Chronically anemic with hgb in 7s. S/p 2u pRBC on 10/30. Continue PO iron supplementation.  Severe protein calorie malnutrition Nutrition eval  Fall and eye bruise Fall from deconditioning in [place] also likely  hepatic encephalopathy. Has  also has had some orthostasis recurrently this year from diuretics. PT has recommended SNF. However, patient wishes to go home. He appears to have adequate support system.  Right foot cellulitis. He had mild erythema over the dorsal right foot. Seems to be stable at this time.   Overall, stable. Long-term prognosis is poor. Patient electing to go home with hospice services. This has been arranged. Okay for discharge today.    PERTINENT LABS:  The results of significant diagnostics from this hospitalization (including imaging, microbiology, ancillary and laboratory) are listed below for reference.    Microbiology: Recent Results (from the past 240 hour(s))  Culture, body fluid-bottle     Status: None (Preliminary result)   Collection Time: 07/25/16  7:45 PM  Result Value Ref Range Status   Specimen Description BLOOD PERITONEAL  Final   Special Requests BOTTLES DRAWN AEROBIC ONLY 5CC  Final   Culture NO GROWTH 4 DAYS  Final   Report Status PENDING  Incomplete  Gram stain     Status: None   Collection Time: 07/25/16  7:45 PM  Result Value Ref Range Status   Specimen Description PERITONEAL  Final   Special Requests NONE  Final   Gram Stain   Final    MODERATE WBC PRESENT,BOTH PMN AND MONONUCLEAR NO ORGANISMS SEEN    Report Status 07/26/2016 FINAL  Final  Culture, blood (routine x 2)     Status: None (Preliminary result)   Collection Time: 07/26/16  6:38 PM  Result Value Ref Range Status   Specimen Description BLOOD LEFT ANTECUBITAL  Final   Special Requests BOTTLES DRAWN AEROBIC AND ANAEROBIC 6CC  Final   Culture NO GROWTH 3 DAYS  Final   Report Status PENDING  Incomplete  Culture, blood (routine x 2)     Status: None (Preliminary result)   Collection Time: 07/26/16  6:50 PM  Result Value Ref Range Status   Specimen Description BLOOD RIGHT HAND  Final   Special Requests BOTTLES DRAWN AEROBIC AND ANAEROBIC 5CC  Final   Culture NO GROWTH 3 DAYS  Final   Report Status PENDING   Incomplete     Labs: Basic Metabolic Panel:  Recent Labs Lab 07/26/16 0102 07/27/16 0512 07/28/16 0633 07/29/16 0519 07/30/16 0328  NA 133* 134* 133* 131* 130*  K 4.3 3.4* 3.8 3.5 3.6  CL 105 106 104 102 102  CO2 21* 21* 21* 21* 22  GLUCOSE 135* 99 96 91 82  BUN 34* 24* 22* 20 17  CREATININE 2.28* 1.50* 1.36* 1.19 1.15  CALCIUM 8.6* 8.8* 8.6* 8.3* 8.1*   Liver Function Tests:  Recent Labs Lab 07/25/16 1750 07/26/16 0102 07/27/16 0512 07/28/16 0633  AST 22 18 19 25   ALT 17 13* 11* 14*  ALKPHOS 80 64 51 49  BILITOT 2.8* 1.8* 4.7* 3.3*  PROT 6.8 6.3* 5.9* 5.7*  ALBUMIN 2.9* 3.5 3.9 3.3*    Recent Labs Lab 07/25/16 1750  LIPASE 34    Recent Labs Lab 07/26/16 0102  AMMONIA 73*   CBC:  Recent Labs Lab 07/25/16 1750 07/26/16 0102 07/27/16 0512 07/28/16 0633 07/29/16 0519 07/30/16 0328  WBC 8.3 3.7* 5.0 9.2 8.7 8.0  NEUTROABS 4.6  --  3.4 6.4 6.0 5.1  HGB 7.3* 6.0* 7.7* 8.1* 7.9* 8.4*  HCT 23.3* 18.6* 22.8* 24.1* 23.5* 25.2*  MCV 75.9* 76.2* 78.4 77.2* 77.8* 78.8  PLT 194 120* 89* 127* 117* 145*   BNP: BNP (last 3 results)  Recent Labs  07/25/16 1750  BNP 56.4    IMAGING STUDIES [place] Renal  Result Date: 07/26/2016 CLINICAL DATA:  Acute kidney injury, history hypertension, cirrhosis EXAM: RENAL / URINARY TRACT ULTRASOUND COMPLETE COMPARISON:  Abdominal ultrasound 01/08/2016 FINDINGS: Right Kidney: Length: 12.1 cm. Normal cortical thickness and echogenicity. No mass, hydronephrosis or shadowing calcification. Left Kidney: Length: 11.1 cm. Normal cortical thickness and echogenicity. No mass, hydronephrosis or shadowing calcification. Bladder: Decompressed by Foley catheter, unable to evaluate. Large volume ascites containing debris. LEFT pleural effusion noted. IMPRESSION: No significant renal sonographic abnormalities. Large volume ascites and LEFT pleural effusion. Electronically Signed   By: Ulyses SouthwardMark  Boles M.D.   On: 07/26/2016 18:01   [place] Abdomen  Limited  Result Date: 07/07/2016 CLINICAL DATA:  Cirrhosis, screening for hepatocellular carcinoma. Multiple paracenteses for large volume ascites removal. EXAM: US ABDOMEN LIMITED - RIGHT UPPER QUADRANT COMPARISON:  Abdominal and pelvic CT scan dated January 04, 2015. FINDINGS: Gallbladder: No gallstones visualized. There is wall thickening to 5.8 mm likely related to the large volume of ascites that is present. No sonographic Murphy sign noted by sonographer. Common bile duct: Diameter: 3.8 mm Liver: The hepatic echotexture is increased. The liver is shrunken. The surface contour is nodular. There is no discrete mass nor ductal dilation. IMPRESSION: Cirrhotic changes of the liver. No suspicious hepatic masses. Large volume of ascites. No gallstones. Gallbladder wall thickening to 5.8 mm consistent with a large amount of adjacent ascites. Electronically Signed   By: David  SwazilandJordan M.D.   On: 07/07/2016 10:00   [place] Paracentesis  Result Date: 07/30/2016 INDICATION: ascites EXAM: ULTRASOUND-GUIDED PARACENTESIS COMPARISON:  Previous paracentesis. MEDICATIONS: 10 cc 1% lidocaine COMPLICATIONS: None immediate. TECHNIQUE: Informed written consent was obtained from the patient after a discussion of the risks, benefits and alternatives to treatment. A timeout was performed prior to the initiation of the procedure. Initial ultrasound scanning demonstrates a large amount of ascites within the right lower abdominal quadrant. The right lower abdomen was prepped and draped in the usual sterile fashion. 1% lidocaine with epinephrine was used for local anesthesia. Under direct ultrasound guidance, a 19 gauge, 7-cm, Yueh catheter was introduced. An ultrasound image was saved for documentation purposed. the paracentesis was performed. The catheter was removed and a dressing was applied. The patient tolerated the procedure well without immediate post procedural complication. FINDINGS: A total of approximately 6 liters of yellow  fluid was removed. IMPRESSION: Successful ultrasound-guided paracentesis yielding 6 liters of peritoneal fluid. Read by Robet LeuPamela A Turpin [facility] Electronically Signed   By: Judie PetitM.  Shick M.D.   On: 07/30/2016 13:20   [place] Paracentesis  Result Date: 07/28/2016 INDICATION: Ascites secondary to alcoholic cirrhosis. Request for diagnostic and therapeutic paracentesis with a 9 liter limit. EXAM: ULTRASOUND GUIDED RIGHT LATERAL ABDOMEN PARACENTESIS MEDICATIONS: 1% Lidocaine. COMPLICATIONS: None immediate. PROCEDURE: Informed written consent was obtained from the patient after a discussion of the risks, benefits and alternatives to treatment. A timeout was performed prior to the initiation of the procedure. Initial ultrasound scanning demonstrates a large amount of ascites within the right lateral abdomen. The right lateral abdomen was prepped and draped in the usual sterile fashion. 1% lidocaine with epinephrine was used for local anesthesia. Following this, a 19 gauge, 7-cm, Yueh catheter was introduced. An ultrasound image was saved for documentation purposes. The paracentesis was performed. The catheter was removed and a dressing was applied. The patient tolerated the procedure well without immediate post procedural complication. FINDINGS: A total of approximately 9 liters of cloudy yellow fluid was  removed. Samples were sent to the laboratory as requested by the clinical team. IMPRESSION: Successful ultrasound-guided paracentesis yielding 9 liters of peritoneal fluid. Read by:  Corrin Parker, PA-C Electronically Signed   By: Gilmer Mor D.O.   On: 07/28/2016 11:46   US Paracentesis  Result Date: 07/14/2016 INDICATION: Alcoholic cirrhosis with recurrent abdominal ascites. Request for diagnostic and therapeutic paracentesis with a maximum of 8 liters. EXAM: ULTRASOUND GUIDED RIGHT LATERAL ABDOMEN PARACENTESIS MEDICATIONS: 1% Lidocaine. COMPLICATIONS: None immediate. PROCEDURE: Informed written consent was obtained from  the patient after a discussion of the risks, benefits and alternatives to treatment. A timeout was performed prior to the initiation of the procedure. Initial ultrasound scanning demonstrates a large amount of ascites within the right lateral abdomen. The right lateral abdomen was prepped and draped in the usual sterile fashion. 1% lidocaine with epinephrine was used for local anesthesia. Following this, a 19 gauge, 7-cm, Yueh catheter was introduced. An ultrasound image was saved for documentation purposes. The paracentesis was performed. The catheter was removed and a dressing was applied. The patient tolerated the procedure well without immediate post procedural complication. FINDINGS: A total of approximately 8 liters of milky light yellow fluid was removed. Samples were sent to the laboratory as requested by the clinical team. IMPRESSION: Successful ultrasound-guided paracentesis yielding 8 liters of peritoneal fluid. Read by:  Corrin Parker, PA-C Electronically Signed   By: Malachy Moan M.D.   On: 07/14/2016 15:20   US Paracentesis  Result Date: 06/30/2016 INDICATION: Alcoholic cirrhosis with recurrent abdominal ascites. Request is made for diagnostic and therapeutic paracentesis. Maximum is 8 L. EXAM: ULTRASOUND GUIDED DIAGNOSTIC AND THERAPEUTIC PARACENTESIS MEDICATIONS: 1% lidocaine COMPLICATIONS: None immediate. PROCEDURE: Informed written consent was obtained from the patient after a discussion of the risks, benefits and alternatives to treatment. A timeout was performed prior to the initiation of the procedure. Initial ultrasound scanning demonstrates a large amount of ascites within the right lower abdominal quadrant. The right lower abdomen was prepped and draped in the usual sterile fashion. 1% lidocaine was used for local anesthesia. Following this, a 19 gauge, 7-cm, Yueh catheter was introduced. An ultrasound image was saved for documentation purposes. The paracentesis was performed. The  catheter was removed and a dressing was applied. The patient tolerated the procedure well without immediate post procedural complication. FINDINGS: A total of approximately 8 L of chylous-colored fluid was removed. Samples were sent to the laboratory as requested by the clinical team. IMPRESSION: Successful ultrasound-guided paracentesis yielding 8 liters of peritoneal fluid. Read by: Barnetta Chapel, PA-C Electronically Signed   By: Simonne Come M.D.   On: 06/30/2016 15:56    DISCHARGE EXAMINATION: Vitals:   07/30/16 1313 07/30/16 1347 07/30/16 1413 07/30/16 1440  BP: 97/67 101/67 98/62 99/63   Pulse: (!) 104 100 (!) 102 (!) 102  Resp: 20 20 20 20   Temp: 98.2 F (36.8 C) 97.6 F (36.4 C) 98.3 F (36.8 C) 98.2 F (36.8 C)  TempSrc: Oral Oral Oral Oral  SpO2: 97% 98% 97% 98%  Weight:      Height:       General appearance: alert, cooperative, appears stated age and no distress Resp: clear to auscultation bilaterally Cardio: regular rate and rhythm, S1, S2 normal, no murmur, click, rub or gallop GI: Abdomen is distended. Soft. Nontender. Ascites is present. Neurologic: Mildly distracted, which appears to be his baseline. No obvious focal neurological deficits.  DISPOSITION: Home with hospice  Discharge Instructions    Call MD for:  extreme  fatigue    Complete by:  As directed    Call MD for:  persistant dizziness or light-headedness    Complete by:  As directed    Call MD for:  persistant nausea and vomiting    Complete by:  As directed    Call MD for:  severe uncontrolled pain    Complete by:  As directed    Call MD for:  temperature >100.4    Complete by:  As directed    Discharge instructions    Complete by:  As directed    Hospice services to follow at home. Dr. Marina Goodell to arrange for outpatient paracentesis.  You were cared for by a hospitalist during your hospital stay. If you have any questions about your discharge medications or the care you received while you were in the  hospital after you are discharged, you can call the unit and asked to speak with the hospitalist on call if the hospitalist that took care of you is not available. Once you are discharged, your primary care physician will handle any further medical issues. Please note that NO REFILLS for any discharge medications will be authorized once you are discharged, as it is imperative that you return to your primary care physician (or establish a relationship with a primary care physician if you do not have one) for your aftercare needs so that they can reassess your need for medications and monitor your lab values. If you do not have a primary care physician, you can call [phone] for a physician referral.   Increase activity slowly    Complete by:  As directed       ALLERGIES:  Allergies  Allergen Reactions  . Tramadol Hives and Itching     Current Discharge Medication List    START taking these medications   Details  ciprofloxacin (CIPRO) 500 MG tablet Take 1 tablet (500 mg total) by mouth daily with breakfast. Qty: 30 tablet, Refills: 1    erythromycin ophthalmic ointment Place into the right eye at bedtime. Qty: 3.5 g, Refills: 0    midodrine (PROAMATINE) 5 MG tablet Take 1 tablet (5 mg total) by mouth 2 (two) times daily with a meal. Qty: 60 tablet, Refills: 1    rifaximin (XIFAXAN) 550 MG TABS tablet Take 1 tablet (550 mg total) by mouth 2 (two) times daily. Qty: 60 tablet, Refills: 0      CONTINUE these medications which have CHANGED   Details  furosemide (LASIX) 20 MG tablet Take 2 tablets (40 mg total) by mouth daily. Qty: 60 tablet, Refills: 1    spironolactone (ALDACTONE) 100 MG tablet Take 1 tablet (100 mg total) by mouth daily. Qty: 30 tablet, Refills: 0      CONTINUE these medications which have NOT CHANGED   Details  ferrous sulfate 325 (65 FE) MG tablet Take 325 mg by mouth daily with breakfast.    lactulose (CHRONULAC) 10 GM/15ML solution Take 30 mLs by mouth 4  (four) times daily. Refills: 6    hydrOXYzine (VISTARIL) 25 MG capsule Take 25 mg by mouth at bedtime as needed for sleep. Refills: 1         Follow-up Information    Yancey Flemings, MD. Schedule an appointment as soon as possible for a visit in 1 week(s).   Specialty:  Gastroenterology Why:  And to arrange outpatient paracentesis. Contact information: [address] [place] [address] [phone]           TOTAL DISCHARGE TIME:  35 minutes  [facility]  [facility] Pager [phone]  07/30/2016, 2:56 PM

## 2016-07-30 NOTE — Clinical Social Work Note (Signed)
CSW received consult for SNF placement today. Was later informed by MD that patient will discharge home with hospice services. Call made to case manager, Gae GallopAngela Cole and informed her of consult.  CSW signing off, please reconsult if any other CSW services needed prior to discharge.  Genelle BalVanessa Henley Blyth, MSW, LCSW Licensed Clinical Social Worker Clinical Social Work Department [facility] [phone]

## 2016-07-30 NOTE — Progress Notes (Addendum)
Daily Rounding Note  07/30/2016, 8:59 AM  LOS: 5 days   SUBJECTIVE:   Chief complaint:  Abdominal swelling.   Underwent 6 liter paracentesis this AM and feels better.  Gearing up for discharge today.     Note palliative care consult: pt open to repeated paracentesis if logistically feasible to provide "more time".  Would like short term rehab.  Implement hospice services on return home (his goal).  OBJECTIVE:         Vital signs in last 24 hours:    Temp:  [98.2 F (36.8 C)-98.9 F (37.2 C)] 98.3 F (36.8 C) (11/03 0458) Pulse Rate:  [63-105] 96 (11/03 0458) Resp:  [18-20] 20 (11/03 0458) BP: (99-125)/(61-73) 125/66 (11/03 0458) SpO2:  [94 %-99 %] 94 % (11/03 0458) Weight:  [98.5 kg (217 lb 2.5 oz)] 98.5 kg (217 lb 2.5 oz) (11/02 2033) Last BM Date: 06/29/16 Filed Weights   07/26/16 2059 07/27/16 2221 07/29/16 2033  Weight: 98 kg (216 lb 0.8 oz) 98.4 kg (216 lb 14.4 oz) 98.5 kg (217 lb 2.5 oz)   General: alert, looks ill but comfortable.  Family in room   Heart: RRR. No mrg Chest: reduced BS on right, no adventitious sounds.  + cough, no dyspnea Abdomen: softer, less but still protuberant.  NT.  BS muted but present  Extremities: no CCE Neuro/Psych:  Oriented x 3.  Asterixis resolved.  Fully alert, approopriate.  Thought/speach process less slow.   Intake/Output from previous day: 11/02 0701 - 11/03 0700 In: 1540 [P.O.:1440; IV Piggyback:100] Out: 0   Intake/Output this shift: Total I/O In: 240 [P.O.:240] Out: -   Lab Results:  Recent Labs  07/28/16 0633 07/29/16 0519 07/30/16 0328  WBC 9.2 8.7 8.0  HGB 8.1* 7.9* 8.4*  HCT 24.1* 23.5* 25.2*  PLT 127* 117* 145*   BMET  Recent Labs  07/28/16 0633 07/29/16 0519 07/30/16 0328  NA 133* 131* 130*  K 3.8 3.5 3.6  CL 104 102 102  CO2 21* 21* 22  GLUCOSE 96 91 82  BUN 22* 20 17  CREATININE 1.36* 1.19 1.15  CALCIUM 8.6* 8.3* 8.1*   LFT  Recent  Labs  07/28/16 0633  PROT 5.7*  ALBUMIN 3.3*  AST 25  ALT 14*  ALKPHOS 49  BILITOT 3.3*  BILIDIR 1.0*  IBILI 2.3*   PT/INR No results for input(s): LABPROT, INR in the last 72 hours. Hepatitis Panel No results for input(s): HEPBSAG, HCVAB, HEPAIGM, HEPBIGM in the last 72 hours.  Studies/Results: [place] Paracentesis  Result Date: 07/28/2016 INDICATION: Ascites secondary to alcoholic cirrhosis. Request for diagnostic and therapeutic paracentesis with a 9 liter limit. EXAM: ULTRASOUND GUIDED RIGHT LATERAL ABDOMEN PARACENTESIS MEDICATIONS: 1% Lidocaine. COMPLICATIONS: None immediate. PROCEDURE: Informed written consent was obtained from the patient after a discussion of the risks, benefits and alternatives to treatment. A timeout was performed prior to the initiation of the procedure. Initial ultrasound scanning demonstrates a large amount of ascites within the right lateral abdomen. The right lateral abdomen was prepped and draped in the usual sterile fashion. 1% lidocaine with epinephrine was used for local anesthesia. Following this, a 19 gauge, 7-cm, Yueh catheter was introduced. An ultrasound image was saved for documentation purposes. The paracentesis was performed. The catheter was removed and a dressing was applied. The patient tolerated the procedure well without immediate post procedural complication. FINDINGS: A total of approximately 9 liters of cloudy yellow fluid was removed. Samples were sent  to the laboratory as requested by the clinical team. IMPRESSION: Successful ultrasound-guided paracentesis yielding 9 liters of peritoneal fluid. Read by:  Corrin ParkerWendy Blair, PA-C Electronically Signed   By: Gilmer MorJaime  Wagner D.O.   On: 07/28/2016 11:46   Scheduled Meds: . albumin human  100 g Intravenous Once  . cefTRIAXone (ROCEPHIN)  IV  2 g Intravenous Q24H  . erythromycin   Right Eye QHS  . feeding supplement (ENSURE ENLIVE)  237 mL Oral BID BM  . ferrous sulfate  325 mg Oral BID WC  . lactulose   30 g Oral Daily  . rifaximin  550 mg Oral BID   Continuous Infusions:  PRN Meds:.acetaminophen **OR** acetaminophen, hydrOXYzine, ondansetron **OR** ondansetron (ZOFRAN) IV, oxyCODONE   ASSESMENT:   * Decompensated alcoholic cirrhosis. MELD-Na 31.   * Refractory ascites requiring large volume paracentesis. Diuretic dosing has been challenging due to AKI, hypotension. Many recent paracenteses.  Re accumulating ascites after latest tap of 11/1.    * SBP on tap of 10/29 has resolve, Rocephin day 5.    * AKI. resolved. Diuretics on hold.  Renal added doses of Albumin through 11/1. Dr Allena KatzPatel suggest Lasix 40, Aldactone 100 mg at discharge.  Not sure BP or kidneys will tolerate.  Wonder if we should start now and observe renal, BP effect while inpt?   *Coagulopathy.   * Hyponatremia.    * AMS, hepatic encephalopathy: improved. Having at least 3 daily BMs.  Lacutlose adjusted to q AM as he refused additional doses. Rifaxamin day 4, Dr Rito EhrlichKrishnan just d/cd this and started the  Cipro.   * Recent falls with minor elbow and right orbital trauma.    *  Focal cellulitis dorsal right foot, this despite the Rocephin.   * Iron deficiency anemia. Not clear that he's taking iron twice a day as prescribed.  s/p PRBC x 2.  PO iron resumed, S/p single dose Parenteral iron. .  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   * Thrombocytopenia.  Improved  * Protein calorie malnutrition.Ensure in place   *  Debilitated.     PLAN   Start the Lasix 40 mg daily Aldactone 100 mg daily now.  Home today Labs [facility] next Tuesday.  He is to call Dr Marina GoodellPerry for follow up and for paracentesis as needed. Suspect he will need one next week. Cipro 500 mg daily, started today for long term SBP prophylaxis.  ? If pt will be able to pay for Rifaxamin?.  Advised pt and his step dtr that if not able to get Rifaximin, that he needs to up his Lactulose to TID.  With  Rifaximin, can stick with Lactulose 30g daily in AM.      Jennye MoccasinSarah Dalon Reichart  07/30/2016, 8:59 AM Pager: [phone]  Addendum:  Dr Rito EhrlichKrishnan discussed use of Midodrine with Dr Allena KatzPatel who says that it might be helpful.  So Dr Rito EhrlichKrishnan is starting Midodrine 5 mg BID.    Attending physician's note   I have taken an interval history, reviewed the chart and examined the patient. I agree with the Advanced Practitioner's note, impression and recommendations. Mgmt recommendations reviewed with SG and outlined above. GI signing off. Outpatient GI follow up with Dr. Yancey FlemingsJohn Yuma.   Claudette HeadMalcolm Stark, MD Clementeen GrahamFACG [phone] Mon-Fri 8a-5p [phone] after 5p, weekends, holidays

## 2016-07-30 NOTE — Discharge Instructions (Signed)
Alcoholic Liver Disease Alcoholic liver disease happens when the liver does not work the way it should. The condition is caused by drinking too much alcohol for many years.  HOME CARE  Do not drink alcohol.  Take medicines only as told by your doctor.  Take vitamins only as told by your doctor.  Follow any diet instructions that your doctor gave you. You may need to:  Eat foods that have thiamine. These include whole-wheat cereals, pork, and raw vegetables.  Eat foods that have folic acid. These include vegetables, fruits, meats, beans, nuts, and dairy foods.  Eat foods that are high in carbohydrates. These include yogurt, beans, potatoes, and rice. GET HELP IF:  You have a fever.  You are short of breath.  You have trouble breathing.  You have bright red blood in your poop (stool).  Your poop looks like tar.  You throw up (vomit) blood.  Your skin looks more yellow, pale, or dark.  You get headaches.  You have trouble thinking.  You have trouble balancing or walking.   This information is not intended to replace advice given to you by your health care provider. Make sure you discuss any questions you have with your health care provider.   Document Released: 07/11/2009 Document Revised: 01/28/2015 Document Reviewed: 08/15/2014 Elsevier Interactive Patient Education 2016 Elsevier Inc.    Ascites Ascites is a collection of excess fluid in the abdomen. Ascites can range from mild to severe. It can get worse without treatment. CAUSES Possible causes include:  Cirrhosis. This is the most common cause of ascites.  Infection or inflammation in the abdomen.  Cancer in the abdomen.  Heart failure.  Kidney disease.  Inflammation of the pancreas.  Clots in the veins of the liver. SIGNS AND SYMPTOMS Signs and symptoms may include:  A feeling of fullness in your abdomen. This is common.  An increase in the size of your abdomen or your waist.  Swelling in  your legs.  Swelling of the scrotum in men.  Difficulty breathing.  Abdominal pain.  Sudden weight gain. If the condition is mild, you may not have symptoms. DIAGNOSIS To make a diagnosis, your health care provider will:  Ask about your medical history.  Perform a physical exam.  Order imaging tests, such as an ultrasound or CT scan of your abdomen. TREATMENT Treatment depends on the cause of the ascites. It may include:  Taking a pill to make you urinate. This is called a water pill (diuretic pill).  Strictly reducing your salt (sodium) intake. Salt can cause extra fluid to be kept in the body, and this makes ascites worse.  Having a procedure to remove fluid from your abdomen (paracentesis).  Having a procedure to transfer fluid from your abdomen into a vein.  Having a procedure that connects two of the major veins within your liver and relieves pressure on your liver (TIPS procedure). Ascites may go away or improve with treatment of the condition that caused it.  HOME CARE INSTRUCTIONS  Keep track of your weight. To do this, weigh yourself at the same time every day and record your weight.  Keep track of how much you drink and any changes in the amount you urinate.  Follow any instructions that your health care provider gives you about how much to drink.  Try not to eat salty (high-sodium) foods.  Take medicines only as directed by your health care provider.  Keep all follow-up visits as directed by your health care provider.  This is important.  Report any changes in your health to your health care provider, especially if you develop new symptoms or your symptoms get worse. SEEK MEDICAL CARE IF:  Your gain more than 3 pounds in 3 days.  Your abdominal size or your waist size increases.  You have new swelling in your legs.  The swelling in your legs gets worse. SEEK IMMEDIATE MEDICAL CARE IF:  You develop a fever.  You develop confusion.  You develop new  or worsening difficulty breathing.  You develop new or worsening abdominal pain.  You develop new or worsening swelling in the scrotum (in men).   This information is not intended to replace advice given to you by your health care provider. Make sure you discuss any questions you have with your health care provider.   Document Released: 09/13/2005 Document Revised: 10/04/2014 Document Reviewed: 04/12/2014 Elsevier Interactive Patient Education [employer]2016 Elsevier Inc.

## 2016-07-30 NOTE — Telephone Encounter (Signed)
Please advise 

## 2016-07-30 NOTE — Progress Notes (Signed)
Palliative Medicine RN Note: Rec'd a call from PMT NP Lorinda CreedMary Larach, who saw the patient yesterday. She is not on today, but she got a call from the patient's daughter.  Family and pt have changed goals. They no longer wish to have pt placed at a facility; they want him to go home with hospice after his paracentesis.   Placed verbal orders from [facility] in [facility] chart; left message for SW Erie NoeVanessa to provide update.  Margret ChanceMelanie G. Arush Gatliff, RN, BSN, [facility], [place] 07/30/2016 9:34 AM Cell [phone] 8:00-4:00 Monday-Friday Office [phone]

## 2016-07-30 NOTE — Progress Notes (Signed)
CM received consult: Please set up for home hospice. CM spoke with pt @ beside regarding discharge plan and confirmed plan is to d/c to home with hospice care. CM provided pt with choice list and [facility]. CM was informed pt will receive 24/7 supervision from Bonnie(sgo) and Angie [phone] , Bonnie's daughter once d/c. CM called and made referral with Sandra([facility]) @ [phone] for home hospice. CM to f/u with disposition needs. Gae Gallopngela Kateland Leisinger RN,BSN,CM [phone]

## 2016-07-31 DIAGNOSIS — D649 Anemia, unspecified: Secondary | ICD-10-CM | POA: Diagnosis not present

## 2016-07-31 DIAGNOSIS — I951 Orthostatic hypotension: Secondary | ICD-10-CM | POA: Diagnosis not present

## 2016-07-31 DIAGNOSIS — K219 Gastro-esophageal reflux disease without esophagitis: Secondary | ICD-10-CM | POA: Diagnosis not present

## 2016-07-31 DIAGNOSIS — I10 Essential (primary) hypertension: Secondary | ICD-10-CM | POA: Diagnosis not present

## 2016-07-31 DIAGNOSIS — R0600 Dyspnea, unspecified: Secondary | ICD-10-CM | POA: Diagnosis not present

## 2016-07-31 DIAGNOSIS — K7031 Alcoholic cirrhosis of liver with ascites: Secondary | ICD-10-CM | POA: Diagnosis not present

## 2016-07-31 DIAGNOSIS — R188 Other ascites: Secondary | ICD-10-CM | POA: Diagnosis not present

## 2016-07-31 DIAGNOSIS — G2581 Restless legs syndrome: Secondary | ICD-10-CM | POA: Diagnosis not present

## 2016-07-31 DIAGNOSIS — E871 Hypo-osmolality and hyponatremia: Secondary | ICD-10-CM | POA: Diagnosis not present

## 2016-07-31 DIAGNOSIS — K7291 Hepatic failure, unspecified with coma: Secondary | ICD-10-CM | POA: Diagnosis not present

## 2016-07-31 DIAGNOSIS — I8500 Esophageal varices without bleeding: Secondary | ICD-10-CM | POA: Diagnosis not present

## 2016-07-31 DIAGNOSIS — D696 Thrombocytopenia, unspecified: Secondary | ICD-10-CM | POA: Diagnosis not present

## 2016-07-31 DIAGNOSIS — K652 Spontaneous bacterial peritonitis: Secondary | ICD-10-CM | POA: Diagnosis not present

## 2016-07-31 LAB — CULTURE, BLOOD (ROUTINE X 2)
CULTURE: NO GROWTH
Culture: NO GROWTH

## 2016-07-31 LAB — CULTURE, BODY FLUID-BOTTLE: CULTURE: NO GROWTH

## 2016-07-31 LAB — CULTURE, BODY FLUID W GRAM STAIN -BOTTLE

## 2016-08-02 DIAGNOSIS — K7031 Alcoholic cirrhosis of liver with ascites: Secondary | ICD-10-CM | POA: Diagnosis not present

## 2016-08-02 DIAGNOSIS — K7291 Hepatic failure, unspecified with coma: Secondary | ICD-10-CM | POA: Diagnosis not present

## 2016-08-02 DIAGNOSIS — I8500 Esophageal varices without bleeding: Secondary | ICD-10-CM | POA: Diagnosis not present

## 2016-08-02 DIAGNOSIS — K652 Spontaneous bacterial peritonitis: Secondary | ICD-10-CM | POA: Diagnosis not present

## 2016-08-02 DIAGNOSIS — D649 Anemia, unspecified: Secondary | ICD-10-CM | POA: Diagnosis not present

## 2016-08-02 DIAGNOSIS — R188 Other ascites: Secondary | ICD-10-CM | POA: Diagnosis not present

## 2016-08-03 DIAGNOSIS — K7291 Hepatic failure, unspecified with coma: Secondary | ICD-10-CM | POA: Diagnosis not present

## 2016-08-03 DIAGNOSIS — D649 Anemia, unspecified: Secondary | ICD-10-CM | POA: Diagnosis not present

## 2016-08-03 DIAGNOSIS — I8500 Esophageal varices without bleeding: Secondary | ICD-10-CM | POA: Diagnosis not present

## 2016-08-03 DIAGNOSIS — K7031 Alcoholic cirrhosis of liver with ascites: Secondary | ICD-10-CM | POA: Diagnosis not present

## 2016-08-03 DIAGNOSIS — K652 Spontaneous bacterial peritonitis: Secondary | ICD-10-CM | POA: Diagnosis not present

## 2016-08-03 DIAGNOSIS — R188 Other ascites: Secondary | ICD-10-CM | POA: Diagnosis not present

## 2016-08-05 DIAGNOSIS — K7291 Hepatic failure, unspecified with coma: Secondary | ICD-10-CM | POA: Diagnosis not present

## 2016-08-05 DIAGNOSIS — I8500 Esophageal varices without bleeding: Secondary | ICD-10-CM | POA: Diagnosis not present

## 2016-08-05 DIAGNOSIS — K7031 Alcoholic cirrhosis of liver with ascites: Secondary | ICD-10-CM | POA: Diagnosis not present

## 2016-08-05 DIAGNOSIS — R188 Other ascites: Secondary | ICD-10-CM | POA: Diagnosis not present

## 2016-08-05 DIAGNOSIS — K652 Spontaneous bacterial peritonitis: Secondary | ICD-10-CM | POA: Diagnosis not present

## 2016-08-05 DIAGNOSIS — D649 Anemia, unspecified: Secondary | ICD-10-CM | POA: Diagnosis not present

## 2016-08-06 ENCOUNTER — Ambulatory Visit (HOSPITAL_COMMUNITY)
Admission: RE | Admit: 2016-08-06 | Discharge: 2016-08-06 | Disposition: A | Discharge status: Home / Self Care | Source: Ambulatory Visit | Attending: Interventional Radiology | Admitting: Interventional Radiology

## 2016-08-06 ENCOUNTER — Other Ambulatory Visit (HOSPITAL_COMMUNITY): Payer: Self-pay | Admitting: Internal Medicine

## 2016-08-06 ENCOUNTER — Encounter (HOSPITAL_COMMUNITY): Payer: Self-pay | Admitting: Radiology

## 2016-08-06 ENCOUNTER — Telehealth (HOSPITAL_COMMUNITY): Payer: Self-pay | Admitting: *Deleted

## 2016-08-06 DIAGNOSIS — K652 Spontaneous bacterial peritonitis: Secondary | ICD-10-CM | POA: Diagnosis not present

## 2016-08-06 DIAGNOSIS — K7460 Unspecified cirrhosis of liver: Secondary | ICD-10-CM | POA: Diagnosis not present

## 2016-08-06 DIAGNOSIS — K7031 Alcoholic cirrhosis of liver with ascites: Secondary | ICD-10-CM

## 2016-08-06 DIAGNOSIS — I8500 Esophageal varices without bleeding: Secondary | ICD-10-CM | POA: Diagnosis not present

## 2016-08-06 DIAGNOSIS — D649 Anemia, unspecified: Secondary | ICD-10-CM | POA: Diagnosis not present

## 2016-08-06 DIAGNOSIS — K7291 Hepatic failure, unspecified with coma: Secondary | ICD-10-CM | POA: Diagnosis not present

## 2016-08-06 DIAGNOSIS — R188 Other ascites: Secondary | ICD-10-CM | POA: Diagnosis not present

## 2016-08-06 HISTORY — PX: IR GENERIC HISTORICAL: IMG1180011

## 2016-08-06 MED ORDER — LIDOCAINE HCL 1 % IJ SOLN
INTRAMUSCULAR | Status: DC | PRN
Start: 2016-08-06 — End: 2016-08-07
  Administered 2016-08-06: 10 mL via INTRADERMAL

## 2016-08-06 MED ORDER — LIDOCAINE HCL 1 % IJ SOLN
INTRAMUSCULAR | Status: AC
  Filled 2016-08-06: qty 20

## 2016-08-06 NOTE — Procedures (Signed)
Ultrasound-guided  therapeutic paracentesis performed yielding 9.6  liters of turbid, yellow fluid. No immediate complications.

## 2016-08-10 ENCOUNTER — Other Ambulatory Visit (HOSPITAL_COMMUNITY): Payer: Self-pay | Admitting: Internal Medicine

## 2016-08-10 ENCOUNTER — Ambulatory Visit: Payer: Medicare Other | Admitting: Internal Medicine

## 2016-08-10 DIAGNOSIS — K7291 Hepatic failure, unspecified with coma: Secondary | ICD-10-CM | POA: Diagnosis not present

## 2016-08-10 DIAGNOSIS — R188 Other ascites: Secondary | ICD-10-CM | POA: Diagnosis not present

## 2016-08-10 DIAGNOSIS — K7031 Alcoholic cirrhosis of liver with ascites: Secondary | ICD-10-CM | POA: Diagnosis not present

## 2016-08-10 DIAGNOSIS — K652 Spontaneous bacterial peritonitis: Secondary | ICD-10-CM | POA: Diagnosis not present

## 2016-08-10 DIAGNOSIS — D649 Anemia, unspecified: Secondary | ICD-10-CM | POA: Diagnosis not present

## 2016-08-10 DIAGNOSIS — K7030 Alcoholic cirrhosis of liver without ascites: Secondary | ICD-10-CM

## 2016-08-10 DIAGNOSIS — I8500 Esophageal varices without bleeding: Secondary | ICD-10-CM | POA: Diagnosis not present

## 2016-08-11 ENCOUNTER — Ambulatory Visit (HOSPITAL_COMMUNITY)
Admission: RE | Admit: 2016-08-11 | Discharge: 2016-08-11 | Disposition: A | Discharge status: Home / Self Care | Source: Ambulatory Visit | Attending: Internal Medicine | Admitting: Internal Medicine

## 2016-08-11 DIAGNOSIS — K7031 Alcoholic cirrhosis of liver with ascites: Secondary | ICD-10-CM | POA: Diagnosis not present

## 2016-08-11 DIAGNOSIS — K652 Spontaneous bacterial peritonitis: Secondary | ICD-10-CM | POA: Diagnosis not present

## 2016-08-11 DIAGNOSIS — K7030 Alcoholic cirrhosis of liver without ascites: Secondary | ICD-10-CM

## 2016-08-11 DIAGNOSIS — D649 Anemia, unspecified: Secondary | ICD-10-CM | POA: Diagnosis not present

## 2016-08-11 DIAGNOSIS — R188 Other ascites: Secondary | ICD-10-CM | POA: Diagnosis not present

## 2016-08-11 DIAGNOSIS — I8500 Esophageal varices without bleeding: Secondary | ICD-10-CM | POA: Diagnosis not present

## 2016-08-11 DIAGNOSIS — K7291 Hepatic failure, unspecified with coma: Secondary | ICD-10-CM | POA: Diagnosis not present

## 2016-08-11 MED ORDER — LIDOCAINE HCL 1 % IJ SOLN
INTRAMUSCULAR | Status: AC
  Filled 2016-08-11: qty 20

## 2016-08-12 DIAGNOSIS — K7031 Alcoholic cirrhosis of liver with ascites: Secondary | ICD-10-CM | POA: Diagnosis not present

## 2016-08-12 DIAGNOSIS — R188 Other ascites: Secondary | ICD-10-CM | POA: Diagnosis not present

## 2016-08-12 DIAGNOSIS — I8500 Esophageal varices without bleeding: Secondary | ICD-10-CM | POA: Diagnosis not present

## 2016-08-12 DIAGNOSIS — D649 Anemia, unspecified: Secondary | ICD-10-CM | POA: Diagnosis not present

## 2016-08-12 DIAGNOSIS — K7291 Hepatic failure, unspecified with coma: Secondary | ICD-10-CM | POA: Diagnosis not present

## 2016-08-12 DIAGNOSIS — K652 Spontaneous bacterial peritonitis: Secondary | ICD-10-CM | POA: Diagnosis not present

## 2016-08-13 DIAGNOSIS — D500 Iron deficiency anemia secondary to blood loss (chronic): Secondary | ICD-10-CM | POA: Diagnosis not present

## 2016-08-13 DIAGNOSIS — Z9181 History of falling: Secondary | ICD-10-CM | POA: Diagnosis not present

## 2016-08-13 DIAGNOSIS — R0602 Shortness of breath: Secondary | ICD-10-CM | POA: Diagnosis not present

## 2016-08-13 DIAGNOSIS — K7291 Hepatic failure, unspecified with coma: Secondary | ICD-10-CM | POA: Diagnosis not present

## 2016-08-13 DIAGNOSIS — R188 Other ascites: Secondary | ICD-10-CM | POA: Diagnosis not present

## 2016-08-13 DIAGNOSIS — K652 Spontaneous bacterial peritonitis: Secondary | ICD-10-CM | POA: Diagnosis not present

## 2016-08-13 DIAGNOSIS — K7031 Alcoholic cirrhosis of liver with ascites: Secondary | ICD-10-CM | POA: Diagnosis not present

## 2016-08-13 DIAGNOSIS — R601 Generalized edema: Secondary | ICD-10-CM | POA: Diagnosis not present

## 2016-08-13 DIAGNOSIS — I8500 Esophageal varices without bleeding: Secondary | ICD-10-CM | POA: Diagnosis not present

## 2016-08-13 DIAGNOSIS — D649 Anemia, unspecified: Secondary | ICD-10-CM | POA: Diagnosis not present

## 2016-08-13 DIAGNOSIS — K219 Gastro-esophageal reflux disease without esophagitis: Secondary | ICD-10-CM | POA: Diagnosis not present

## 2016-08-13 DIAGNOSIS — D696 Thrombocytopenia, unspecified: Secondary | ICD-10-CM | POA: Diagnosis not present

## 2016-08-13 DIAGNOSIS — L299 Pruritus, unspecified: Secondary | ICD-10-CM | POA: Diagnosis not present

## 2016-08-17 ENCOUNTER — Other Ambulatory Visit (HOSPITAL_COMMUNITY): Payer: Self-pay | Admitting: Adult Health

## 2016-08-17 DIAGNOSIS — D649 Anemia, unspecified: Secondary | ICD-10-CM | POA: Diagnosis not present

## 2016-08-17 DIAGNOSIS — K652 Spontaneous bacterial peritonitis: Secondary | ICD-10-CM | POA: Diagnosis not present

## 2016-08-17 DIAGNOSIS — I8500 Esophageal varices without bleeding: Secondary | ICD-10-CM | POA: Diagnosis not present

## 2016-08-17 DIAGNOSIS — K7291 Hepatic failure, unspecified with coma: Secondary | ICD-10-CM | POA: Diagnosis not present

## 2016-08-17 DIAGNOSIS — R188 Other ascites: Secondary | ICD-10-CM

## 2016-08-17 DIAGNOSIS — K7031 Alcoholic cirrhosis of liver with ascites: Secondary | ICD-10-CM | POA: Diagnosis not present

## 2016-08-17 DIAGNOSIS — K7469 Other cirrhosis of liver: Secondary | ICD-10-CM

## 2016-08-18 ENCOUNTER — Ambulatory Visit (HOSPITAL_COMMUNITY)
Admission: RE | Admit: 2016-08-18 | Discharge: 2016-08-18 | Disposition: A | Discharge status: Home / Self Care | Source: Ambulatory Visit | Attending: Adult Health | Admitting: Adult Health

## 2016-08-18 DIAGNOSIS — I8500 Esophageal varices without bleeding: Secondary | ICD-10-CM | POA: Diagnosis not present

## 2016-08-18 DIAGNOSIS — K652 Spontaneous bacterial peritonitis: Secondary | ICD-10-CM | POA: Diagnosis not present

## 2016-08-18 DIAGNOSIS — D649 Anemia, unspecified: Secondary | ICD-10-CM | POA: Diagnosis not present

## 2016-08-18 DIAGNOSIS — K7469 Other cirrhosis of liver: Secondary | ICD-10-CM

## 2016-08-18 DIAGNOSIS — K7031 Alcoholic cirrhosis of liver with ascites: Secondary | ICD-10-CM | POA: Diagnosis not present

## 2016-08-18 DIAGNOSIS — K7291 Hepatic failure, unspecified with coma: Secondary | ICD-10-CM | POA: Diagnosis not present

## 2016-08-18 DIAGNOSIS — R188 Other ascites: Secondary | ICD-10-CM | POA: Diagnosis not present

## 2016-08-18 NOTE — Procedures (Signed)
Ultrasound-guided therapeutic paracentesis due to alcoholic cirrhosis. Successful paracentesis performed in the right mid abdomen yielding 12L of turbid, milky-yellow fluid.  No immediate complications.   Loyce DysKacie Dinorah Masullo, MS RD PA-C

## 2016-08-20 DIAGNOSIS — D649 Anemia, unspecified: Secondary | ICD-10-CM | POA: Diagnosis not present

## 2016-08-20 DIAGNOSIS — I8500 Esophageal varices without bleeding: Secondary | ICD-10-CM | POA: Diagnosis not present

## 2016-08-20 DIAGNOSIS — K7291 Hepatic failure, unspecified with coma: Secondary | ICD-10-CM | POA: Diagnosis not present

## 2016-08-20 DIAGNOSIS — K7031 Alcoholic cirrhosis of liver with ascites: Secondary | ICD-10-CM | POA: Diagnosis not present

## 2016-08-20 DIAGNOSIS — K652 Spontaneous bacterial peritonitis: Secondary | ICD-10-CM | POA: Diagnosis not present

## 2016-08-20 DIAGNOSIS — R188 Other ascites: Secondary | ICD-10-CM | POA: Diagnosis not present

## 2016-08-23 ENCOUNTER — Other Ambulatory Visit (HOSPITAL_COMMUNITY): Payer: Self-pay | Admitting: Adult Health

## 2016-08-23 DIAGNOSIS — K7031 Alcoholic cirrhosis of liver with ascites: Secondary | ICD-10-CM

## 2016-08-24 DIAGNOSIS — I8500 Esophageal varices without bleeding: Secondary | ICD-10-CM | POA: Diagnosis not present

## 2016-08-24 DIAGNOSIS — K7031 Alcoholic cirrhosis of liver with ascites: Secondary | ICD-10-CM | POA: Diagnosis not present

## 2016-08-24 DIAGNOSIS — R188 Other ascites: Secondary | ICD-10-CM | POA: Diagnosis not present

## 2016-08-24 DIAGNOSIS — K652 Spontaneous bacterial peritonitis: Secondary | ICD-10-CM | POA: Diagnosis not present

## 2016-08-24 DIAGNOSIS — D649 Anemia, unspecified: Secondary | ICD-10-CM | POA: Diagnosis not present

## 2016-08-24 DIAGNOSIS — K7291 Hepatic failure, unspecified with coma: Secondary | ICD-10-CM | POA: Diagnosis not present

## 2016-08-25 DIAGNOSIS — K7291 Hepatic failure, unspecified with coma: Secondary | ICD-10-CM | POA: Diagnosis not present

## 2016-08-25 DIAGNOSIS — D649 Anemia, unspecified: Secondary | ICD-10-CM | POA: Diagnosis not present

## 2016-08-25 DIAGNOSIS — I8500 Esophageal varices without bleeding: Secondary | ICD-10-CM | POA: Diagnosis not present

## 2016-08-25 DIAGNOSIS — R188 Other ascites: Secondary | ICD-10-CM | POA: Diagnosis not present

## 2016-08-25 DIAGNOSIS — K7031 Alcoholic cirrhosis of liver with ascites: Secondary | ICD-10-CM | POA: Diagnosis not present

## 2016-08-25 DIAGNOSIS — K652 Spontaneous bacterial peritonitis: Secondary | ICD-10-CM | POA: Diagnosis not present

## 2016-08-27 ENCOUNTER — Other Ambulatory Visit (HOSPITAL_COMMUNITY): Payer: Self-pay | Admitting: Adult Health

## 2016-08-27 DIAGNOSIS — R0600 Dyspnea, unspecified: Secondary | ICD-10-CM | POA: Diagnosis not present

## 2016-08-27 DIAGNOSIS — K7031 Alcoholic cirrhosis of liver with ascites: Secondary | ICD-10-CM | POA: Diagnosis not present

## 2016-08-27 DIAGNOSIS — I951 Orthostatic hypotension: Secondary | ICD-10-CM | POA: Diagnosis not present

## 2016-08-27 DIAGNOSIS — G2581 Restless legs syndrome: Secondary | ICD-10-CM | POA: Diagnosis not present

## 2016-08-27 DIAGNOSIS — I10 Essential (primary) hypertension: Secondary | ICD-10-CM | POA: Diagnosis not present

## 2016-08-27 DIAGNOSIS — K652 Spontaneous bacterial peritonitis: Secondary | ICD-10-CM | POA: Diagnosis not present

## 2016-08-27 DIAGNOSIS — R188 Other ascites: Secondary | ICD-10-CM

## 2016-08-27 DIAGNOSIS — K219 Gastro-esophageal reflux disease without esophagitis: Secondary | ICD-10-CM | POA: Diagnosis not present

## 2016-08-27 DIAGNOSIS — D649 Anemia, unspecified: Secondary | ICD-10-CM | POA: Diagnosis not present

## 2016-08-27 DIAGNOSIS — E871 Hypo-osmolality and hyponatremia: Secondary | ICD-10-CM | POA: Diagnosis not present

## 2016-08-27 DIAGNOSIS — I8500 Esophageal varices without bleeding: Secondary | ICD-10-CM | POA: Diagnosis not present

## 2016-08-27 DIAGNOSIS — K7291 Hepatic failure, unspecified with coma: Secondary | ICD-10-CM | POA: Diagnosis not present

## 2016-08-27 DIAGNOSIS — D696 Thrombocytopenia, unspecified: Secondary | ICD-10-CM | POA: Diagnosis not present

## 2016-08-30 ENCOUNTER — Ambulatory Visit (HOSPITAL_COMMUNITY)

## 2016-08-30 ENCOUNTER — Ambulatory Visit (HOSPITAL_COMMUNITY)
Admission: RE | Admit: 2016-08-30 | Discharge: 2016-08-30 | Disposition: A | Discharge status: Home / Self Care | Source: Ambulatory Visit | Attending: Adult Health | Admitting: Adult Health

## 2016-08-30 DIAGNOSIS — K7031 Alcoholic cirrhosis of liver with ascites: Secondary | ICD-10-CM | POA: Insufficient documentation

## 2016-08-30 DIAGNOSIS — R188 Other ascites: Secondary | ICD-10-CM

## 2016-08-30 MED ORDER — LIDOCAINE HCL 1 % IJ SOLN
INTRAMUSCULAR | Status: AC
  Filled 2016-08-30: qty 20

## 2016-08-30 NOTE — Procedures (Signed)
  US guided LLQ paracentesis  11 L max per MD Collected without complication  tolerated well

## 2016-09-03 ENCOUNTER — Other Ambulatory Visit (HOSPITAL_COMMUNITY): Payer: Self-pay | Admitting: Adult Health

## 2016-09-03 DIAGNOSIS — R188 Other ascites: Secondary | ICD-10-CM

## 2016-09-06 ENCOUNTER — Other Ambulatory Visit: Payer: Self-pay | Admitting: Internal Medicine

## 2016-09-06 ENCOUNTER — Ambulatory Visit (HOSPITAL_COMMUNITY)
Admission: RE | Admit: 2016-09-06 | Discharge: 2016-09-06 | Disposition: A | Discharge status: Home / Self Care | Source: Ambulatory Visit | Attending: Adult Health | Admitting: Adult Health

## 2016-09-06 DIAGNOSIS — K7031 Alcoholic cirrhosis of liver with ascites: Secondary | ICD-10-CM | POA: Insufficient documentation

## 2016-09-06 DIAGNOSIS — R188 Other ascites: Secondary | ICD-10-CM | POA: Diagnosis present

## 2016-09-06 NOTE — Procedures (Signed)
Ultrasound-guided  therapeutic paracentesis performed yielding 11 liters (maximum ordered) of chylous/milky fluid. No immediate complications.

## 2016-09-14 ENCOUNTER — Other Ambulatory Visit (HOSPITAL_COMMUNITY): Payer: Self-pay | Admitting: Adult Health

## 2016-09-14 DIAGNOSIS — K7031 Alcoholic cirrhosis of liver with ascites: Secondary | ICD-10-CM

## 2016-09-15 ENCOUNTER — Ambulatory Visit (HOSPITAL_COMMUNITY)
Admission: RE | Admit: 2016-09-15 | Discharge: 2016-09-15 | Disposition: A | Discharge status: Home / Self Care | Source: Ambulatory Visit | Attending: Adult Health | Admitting: Adult Health

## 2016-09-15 DIAGNOSIS — K7031 Alcoholic cirrhosis of liver with ascites: Secondary | ICD-10-CM | POA: Diagnosis not present

## 2016-09-15 DIAGNOSIS — R188 Other ascites: Secondary | ICD-10-CM | POA: Diagnosis not present

## 2016-09-15 NOTE — Procedures (Signed)
Ultrasound-guided therapeutic paracentesis performed yielding 13 liters of chylous/milky colored fluid. No immediate complications.

## 2016-09-22 ENCOUNTER — Other Ambulatory Visit (HOSPITAL_COMMUNITY): Payer: Self-pay | Admitting: Adult Health

## 2016-09-22 DIAGNOSIS — K7031 Alcoholic cirrhosis of liver with ascites: Secondary | ICD-10-CM

## 2016-09-23 ENCOUNTER — Ambulatory Visit (HOSPITAL_COMMUNITY)
Admission: RE | Admit: 2016-09-23 | Discharge: 2016-09-23 | Disposition: A | Discharge status: Home / Self Care | Source: Ambulatory Visit | Attending: Adult Health | Admitting: Adult Health

## 2016-09-23 DIAGNOSIS — K7031 Alcoholic cirrhosis of liver with ascites: Secondary | ICD-10-CM | POA: Insufficient documentation

## 2016-09-23 NOTE — Procedures (Signed)
Ultrasound-guided therapeutic paracentesis performed yielding 13 liters of chylous-colored fluid. No immediate complications.  Alvy Alsop E 3:08 PM 09/23/2016

## 2016-09-27 DIAGNOSIS — K7031 Alcoholic cirrhosis of liver with ascites: Secondary | ICD-10-CM | POA: Diagnosis not present

## 2016-09-27 DIAGNOSIS — K652 Spontaneous bacterial peritonitis: Secondary | ICD-10-CM | POA: Diagnosis not present

## 2016-09-27 DIAGNOSIS — E871 Hypo-osmolality and hyponatremia: Secondary | ICD-10-CM | POA: Diagnosis not present

## 2016-09-27 DIAGNOSIS — D649 Anemia, unspecified: Secondary | ICD-10-CM | POA: Diagnosis not present

## 2016-09-27 DIAGNOSIS — K7291 Hepatic failure, unspecified with coma: Secondary | ICD-10-CM | POA: Diagnosis not present

## 2016-09-27 DIAGNOSIS — K219 Gastro-esophageal reflux disease without esophagitis: Secondary | ICD-10-CM | POA: Diagnosis not present

## 2016-09-27 DIAGNOSIS — I8500 Esophageal varices without bleeding: Secondary | ICD-10-CM | POA: Diagnosis not present

## 2016-09-27 DIAGNOSIS — D696 Thrombocytopenia, unspecified: Secondary | ICD-10-CM | POA: Diagnosis not present

## 2016-09-27 DIAGNOSIS — I10 Essential (primary) hypertension: Secondary | ICD-10-CM | POA: Diagnosis not present

## 2016-09-27 DIAGNOSIS — G2581 Restless legs syndrome: Secondary | ICD-10-CM | POA: Diagnosis not present

## 2016-09-27 DIAGNOSIS — I951 Orthostatic hypotension: Secondary | ICD-10-CM | POA: Diagnosis not present

## 2016-09-27 DIAGNOSIS — R0600 Dyspnea, unspecified: Secondary | ICD-10-CM | POA: Diagnosis not present

## 2016-09-27 DIAGNOSIS — R188 Other ascites: Secondary | ICD-10-CM | POA: Diagnosis not present

## 2016-09-28 ENCOUNTER — Other Ambulatory Visit (HOSPITAL_COMMUNITY): Payer: Self-pay | Admitting: Adult Health

## 2016-09-28 DIAGNOSIS — D649 Anemia, unspecified: Secondary | ICD-10-CM | POA: Diagnosis not present

## 2016-09-28 DIAGNOSIS — K7291 Hepatic failure, unspecified with coma: Secondary | ICD-10-CM | POA: Diagnosis not present

## 2016-09-28 DIAGNOSIS — I8500 Esophageal varices without bleeding: Secondary | ICD-10-CM | POA: Diagnosis not present

## 2016-09-28 DIAGNOSIS — K7031 Alcoholic cirrhosis of liver with ascites: Secondary | ICD-10-CM | POA: Diagnosis not present

## 2016-09-28 DIAGNOSIS — R188 Other ascites: Secondary | ICD-10-CM | POA: Diagnosis not present

## 2016-09-28 DIAGNOSIS — K652 Spontaneous bacterial peritonitis: Secondary | ICD-10-CM | POA: Diagnosis not present

## 2016-09-30 ENCOUNTER — Ambulatory Visit (HOSPITAL_COMMUNITY)
Admission: RE | Admit: 2016-09-30 | Discharge: 2016-09-30 | Disposition: A | Discharge status: Home / Self Care | Source: Ambulatory Visit | Attending: Adult Health | Admitting: Adult Health

## 2016-09-30 DIAGNOSIS — K7031 Alcoholic cirrhosis of liver with ascites: Secondary | ICD-10-CM | POA: Diagnosis not present

## 2016-09-30 DIAGNOSIS — K652 Spontaneous bacterial peritonitis: Secondary | ICD-10-CM | POA: Diagnosis not present

## 2016-09-30 DIAGNOSIS — R188 Other ascites: Secondary | ICD-10-CM | POA: Diagnosis not present

## 2016-09-30 DIAGNOSIS — D649 Anemia, unspecified: Secondary | ICD-10-CM | POA: Diagnosis not present

## 2016-09-30 DIAGNOSIS — K7291 Hepatic failure, unspecified with coma: Secondary | ICD-10-CM | POA: Diagnosis not present

## 2016-09-30 DIAGNOSIS — I8500 Esophageal varices without bleeding: Secondary | ICD-10-CM | POA: Diagnosis not present

## 2016-09-30 NOTE — Procedures (Signed)
Successful US guided therapeutic paracentesis from right lateral abdomen.  Yielded 10.8L of chylous-colored fluid.  No immediate complications.  Pt tolerated well.   Specimen was no sent for labs.  Hoyt KochKacie Sue-Ellen Skylier Kretschmer PA-C 09/30/2016 3:10 PM

## 2016-10-01 DIAGNOSIS — K652 Spontaneous bacterial peritonitis: Secondary | ICD-10-CM | POA: Diagnosis not present

## 2016-10-01 DIAGNOSIS — K7291 Hepatic failure, unspecified with coma: Secondary | ICD-10-CM | POA: Diagnosis not present

## 2016-10-01 DIAGNOSIS — K7031 Alcoholic cirrhosis of liver with ascites: Secondary | ICD-10-CM | POA: Diagnosis not present

## 2016-10-01 DIAGNOSIS — I8500 Esophageal varices without bleeding: Secondary | ICD-10-CM | POA: Diagnosis not present

## 2016-10-01 DIAGNOSIS — D649 Anemia, unspecified: Secondary | ICD-10-CM | POA: Diagnosis not present

## 2016-10-01 DIAGNOSIS — R188 Other ascites: Secondary | ICD-10-CM | POA: Diagnosis not present

## 2016-10-04 DIAGNOSIS — K7031 Alcoholic cirrhosis of liver with ascites: Secondary | ICD-10-CM | POA: Diagnosis not present

## 2016-10-04 DIAGNOSIS — K652 Spontaneous bacterial peritonitis: Secondary | ICD-10-CM | POA: Diagnosis not present

## 2016-10-04 DIAGNOSIS — I8500 Esophageal varices without bleeding: Secondary | ICD-10-CM | POA: Diagnosis not present

## 2016-10-04 DIAGNOSIS — R188 Other ascites: Secondary | ICD-10-CM | POA: Diagnosis not present

## 2016-10-04 DIAGNOSIS — K7291 Hepatic failure, unspecified with coma: Secondary | ICD-10-CM | POA: Diagnosis not present

## 2016-10-04 DIAGNOSIS — D649 Anemia, unspecified: Secondary | ICD-10-CM | POA: Diagnosis not present

## 2016-10-05 ENCOUNTER — Other Ambulatory Visit (HOSPITAL_COMMUNITY): Payer: Self-pay | Admitting: Adult Health

## 2016-10-05 DIAGNOSIS — K7291 Hepatic failure, unspecified with coma: Secondary | ICD-10-CM | POA: Diagnosis not present

## 2016-10-05 DIAGNOSIS — I8500 Esophageal varices without bleeding: Secondary | ICD-10-CM | POA: Diagnosis not present

## 2016-10-05 DIAGNOSIS — K652 Spontaneous bacterial peritonitis: Secondary | ICD-10-CM | POA: Diagnosis not present

## 2016-10-05 DIAGNOSIS — D649 Anemia, unspecified: Secondary | ICD-10-CM | POA: Diagnosis not present

## 2016-10-05 DIAGNOSIS — K7031 Alcoholic cirrhosis of liver with ascites: Secondary | ICD-10-CM

## 2016-10-05 DIAGNOSIS — R188 Other ascites: Secondary | ICD-10-CM | POA: Diagnosis not present

## 2016-10-06 ENCOUNTER — Ambulatory Visit (HOSPITAL_COMMUNITY)
Admission: RE | Admit: 2016-10-06 | Discharge: 2016-10-06 | Disposition: A | Discharge status: Home / Self Care | Source: Ambulatory Visit | Attending: Adult Health | Admitting: Adult Health

## 2016-10-06 DIAGNOSIS — K652 Spontaneous bacterial peritonitis: Secondary | ICD-10-CM | POA: Diagnosis not present

## 2016-10-06 DIAGNOSIS — D649 Anemia, unspecified: Secondary | ICD-10-CM | POA: Diagnosis not present

## 2016-10-06 DIAGNOSIS — R188 Other ascites: Secondary | ICD-10-CM | POA: Diagnosis not present

## 2016-10-06 DIAGNOSIS — K7291 Hepatic failure, unspecified with coma: Secondary | ICD-10-CM | POA: Diagnosis not present

## 2016-10-06 DIAGNOSIS — K7031 Alcoholic cirrhosis of liver with ascites: Secondary | ICD-10-CM | POA: Diagnosis not present

## 2016-10-06 DIAGNOSIS — I8500 Esophageal varices without bleeding: Secondary | ICD-10-CM | POA: Diagnosis not present

## 2016-10-06 NOTE — Procedures (Signed)
Ultrasound-guided  therapeutic paracentesis performed yielding 11 liters of chylous/milky fluid. No immediate complications.

## 2016-10-07 DIAGNOSIS — D649 Anemia, unspecified: Secondary | ICD-10-CM | POA: Diagnosis not present

## 2016-10-07 DIAGNOSIS — K7031 Alcoholic cirrhosis of liver with ascites: Secondary | ICD-10-CM | POA: Diagnosis not present

## 2016-10-07 DIAGNOSIS — R188 Other ascites: Secondary | ICD-10-CM | POA: Diagnosis not present

## 2016-10-07 DIAGNOSIS — I8500 Esophageal varices without bleeding: Secondary | ICD-10-CM | POA: Diagnosis not present

## 2016-10-07 DIAGNOSIS — K7291 Hepatic failure, unspecified with coma: Secondary | ICD-10-CM | POA: Diagnosis not present

## 2016-10-07 DIAGNOSIS — K652 Spontaneous bacterial peritonitis: Secondary | ICD-10-CM | POA: Diagnosis not present

## 2016-10-08 DIAGNOSIS — K7291 Hepatic failure, unspecified with coma: Secondary | ICD-10-CM | POA: Diagnosis not present

## 2016-10-08 DIAGNOSIS — R188 Other ascites: Secondary | ICD-10-CM | POA: Diagnosis not present

## 2016-10-08 DIAGNOSIS — I8500 Esophageal varices without bleeding: Secondary | ICD-10-CM | POA: Diagnosis not present

## 2016-10-08 DIAGNOSIS — K7031 Alcoholic cirrhosis of liver with ascites: Secondary | ICD-10-CM | POA: Diagnosis not present

## 2016-10-08 DIAGNOSIS — D649 Anemia, unspecified: Secondary | ICD-10-CM | POA: Diagnosis not present

## 2016-10-08 DIAGNOSIS — K652 Spontaneous bacterial peritonitis: Secondary | ICD-10-CM | POA: Diagnosis not present

## 2016-10-09 DIAGNOSIS — K7031 Alcoholic cirrhosis of liver with ascites: Secondary | ICD-10-CM | POA: Diagnosis not present

## 2016-10-09 DIAGNOSIS — K652 Spontaneous bacterial peritonitis: Secondary | ICD-10-CM | POA: Diagnosis not present

## 2016-10-09 DIAGNOSIS — K7291 Hepatic failure, unspecified with coma: Secondary | ICD-10-CM | POA: Diagnosis not present

## 2016-10-09 DIAGNOSIS — R188 Other ascites: Secondary | ICD-10-CM | POA: Diagnosis not present

## 2016-10-09 DIAGNOSIS — D649 Anemia, unspecified: Secondary | ICD-10-CM | POA: Diagnosis not present

## 2016-10-09 DIAGNOSIS — I8500 Esophageal varices without bleeding: Secondary | ICD-10-CM | POA: Diagnosis not present

## 2016-10-10 DIAGNOSIS — R188 Other ascites: Secondary | ICD-10-CM | POA: Diagnosis not present

## 2016-10-10 DIAGNOSIS — K652 Spontaneous bacterial peritonitis: Secondary | ICD-10-CM | POA: Diagnosis not present

## 2016-10-10 DIAGNOSIS — K7031 Alcoholic cirrhosis of liver with ascites: Secondary | ICD-10-CM | POA: Diagnosis not present

## 2016-10-10 DIAGNOSIS — D649 Anemia, unspecified: Secondary | ICD-10-CM | POA: Diagnosis not present

## 2016-10-10 DIAGNOSIS — K7291 Hepatic failure, unspecified with coma: Secondary | ICD-10-CM | POA: Diagnosis not present

## 2016-10-10 DIAGNOSIS — I8500 Esophageal varices without bleeding: Secondary | ICD-10-CM | POA: Diagnosis not present

## 2016-10-11 DIAGNOSIS — R188 Other ascites: Secondary | ICD-10-CM | POA: Diagnosis not present

## 2016-10-11 DIAGNOSIS — K7291 Hepatic failure, unspecified with coma: Secondary | ICD-10-CM | POA: Diagnosis not present

## 2016-10-11 DIAGNOSIS — K7031 Alcoholic cirrhosis of liver with ascites: Secondary | ICD-10-CM | POA: Diagnosis not present

## 2016-10-11 DIAGNOSIS — D649 Anemia, unspecified: Secondary | ICD-10-CM | POA: Diagnosis not present

## 2016-10-11 DIAGNOSIS — K652 Spontaneous bacterial peritonitis: Secondary | ICD-10-CM | POA: Diagnosis not present

## 2016-10-11 DIAGNOSIS — I8500 Esophageal varices without bleeding: Secondary | ICD-10-CM | POA: Diagnosis not present

## 2016-10-15 DIAGNOSIS — K7291 Hepatic failure, unspecified with coma: Secondary | ICD-10-CM | POA: Diagnosis not present

## 2016-10-15 DIAGNOSIS — R188 Other ascites: Secondary | ICD-10-CM | POA: Diagnosis not present

## 2016-10-15 DIAGNOSIS — D649 Anemia, unspecified: Secondary | ICD-10-CM | POA: Diagnosis not present

## 2016-10-15 DIAGNOSIS — K7031 Alcoholic cirrhosis of liver with ascites: Secondary | ICD-10-CM | POA: Diagnosis not present

## 2016-10-15 DIAGNOSIS — I8500 Esophageal varices without bleeding: Secondary | ICD-10-CM | POA: Diagnosis not present

## 2016-10-15 DIAGNOSIS — K652 Spontaneous bacterial peritonitis: Secondary | ICD-10-CM | POA: Diagnosis not present

## 2016-10-18 DIAGNOSIS — K7291 Hepatic failure, unspecified with coma: Secondary | ICD-10-CM | POA: Diagnosis not present

## 2016-10-18 DIAGNOSIS — D649 Anemia, unspecified: Secondary | ICD-10-CM | POA: Diagnosis not present

## 2016-10-18 DIAGNOSIS — I8500 Esophageal varices without bleeding: Secondary | ICD-10-CM | POA: Diagnosis not present

## 2016-10-18 DIAGNOSIS — K7031 Alcoholic cirrhosis of liver with ascites: Secondary | ICD-10-CM | POA: Diagnosis not present

## 2016-10-18 DIAGNOSIS — R188 Other ascites: Secondary | ICD-10-CM | POA: Diagnosis not present

## 2016-10-18 DIAGNOSIS — K652 Spontaneous bacterial peritonitis: Secondary | ICD-10-CM | POA: Diagnosis not present

## 2016-10-20 DIAGNOSIS — K7291 Hepatic failure, unspecified with coma: Secondary | ICD-10-CM | POA: Diagnosis not present

## 2016-10-20 DIAGNOSIS — K7031 Alcoholic cirrhosis of liver with ascites: Secondary | ICD-10-CM | POA: Diagnosis not present

## 2016-10-20 DIAGNOSIS — D649 Anemia, unspecified: Secondary | ICD-10-CM | POA: Diagnosis not present

## 2016-10-20 DIAGNOSIS — K652 Spontaneous bacterial peritonitis: Secondary | ICD-10-CM | POA: Diagnosis not present

## 2016-10-20 DIAGNOSIS — R188 Other ascites: Secondary | ICD-10-CM | POA: Diagnosis not present

## 2016-10-20 DIAGNOSIS — I8500 Esophageal varices without bleeding: Secondary | ICD-10-CM | POA: Diagnosis not present

## 2016-10-21 DIAGNOSIS — I8500 Esophageal varices without bleeding: Secondary | ICD-10-CM | POA: Diagnosis not present

## 2016-10-21 DIAGNOSIS — D649 Anemia, unspecified: Secondary | ICD-10-CM | POA: Diagnosis not present

## 2016-10-21 DIAGNOSIS — K652 Spontaneous bacterial peritonitis: Secondary | ICD-10-CM | POA: Diagnosis not present

## 2016-10-21 DIAGNOSIS — K7031 Alcoholic cirrhosis of liver with ascites: Secondary | ICD-10-CM | POA: Diagnosis not present

## 2016-10-21 DIAGNOSIS — K7291 Hepatic failure, unspecified with coma: Secondary | ICD-10-CM | POA: Diagnosis not present

## 2016-10-21 DIAGNOSIS — R188 Other ascites: Secondary | ICD-10-CM | POA: Diagnosis not present

## 2016-10-24 DIAGNOSIS — K7031 Alcoholic cirrhosis of liver with ascites: Secondary | ICD-10-CM | POA: Diagnosis not present

## 2016-10-24 DIAGNOSIS — R188 Other ascites: Secondary | ICD-10-CM | POA: Diagnosis not present

## 2016-10-24 DIAGNOSIS — K7291 Hepatic failure, unspecified with coma: Secondary | ICD-10-CM | POA: Diagnosis not present

## 2016-10-24 DIAGNOSIS — K652 Spontaneous bacterial peritonitis: Secondary | ICD-10-CM | POA: Diagnosis not present

## 2016-10-24 DIAGNOSIS — D649 Anemia, unspecified: Secondary | ICD-10-CM | POA: Diagnosis not present

## 2016-10-24 DIAGNOSIS — I8500 Esophageal varices without bleeding: Secondary | ICD-10-CM | POA: Diagnosis not present

## 2016-10-25 DIAGNOSIS — I8500 Esophageal varices without bleeding: Secondary | ICD-10-CM | POA: Diagnosis not present

## 2016-10-25 DIAGNOSIS — D649 Anemia, unspecified: Secondary | ICD-10-CM | POA: Diagnosis not present

## 2016-10-25 DIAGNOSIS — K652 Spontaneous bacterial peritonitis: Secondary | ICD-10-CM | POA: Diagnosis not present

## 2016-10-25 DIAGNOSIS — R188 Other ascites: Secondary | ICD-10-CM | POA: Diagnosis not present

## 2016-10-25 DIAGNOSIS — K7291 Hepatic failure, unspecified with coma: Secondary | ICD-10-CM | POA: Diagnosis not present

## 2016-10-25 DIAGNOSIS — K7031 Alcoholic cirrhosis of liver with ascites: Secondary | ICD-10-CM | POA: Diagnosis not present

## 2016-10-26 DIAGNOSIS — K7291 Hepatic failure, unspecified with coma: Secondary | ICD-10-CM | POA: Diagnosis not present

## 2016-10-26 DIAGNOSIS — D649 Anemia, unspecified: Secondary | ICD-10-CM | POA: Diagnosis not present

## 2016-10-26 DIAGNOSIS — R188 Other ascites: Secondary | ICD-10-CM | POA: Diagnosis not present

## 2016-10-26 DIAGNOSIS — K7031 Alcoholic cirrhosis of liver with ascites: Secondary | ICD-10-CM | POA: Diagnosis not present

## 2016-10-26 DIAGNOSIS — I8500 Esophageal varices without bleeding: Secondary | ICD-10-CM | POA: Diagnosis not present

## 2016-10-26 DIAGNOSIS — K652 Spontaneous bacterial peritonitis: Secondary | ICD-10-CM | POA: Diagnosis not present

## 2016-10-27 DIAGNOSIS — K7291 Hepatic failure, unspecified with coma: Secondary | ICD-10-CM | POA: Diagnosis not present

## 2016-10-27 DIAGNOSIS — I8500 Esophageal varices without bleeding: Secondary | ICD-10-CM | POA: Diagnosis not present

## 2016-10-27 DIAGNOSIS — K7031 Alcoholic cirrhosis of liver with ascites: Secondary | ICD-10-CM | POA: Diagnosis not present

## 2016-10-27 DIAGNOSIS — R188 Other ascites: Secondary | ICD-10-CM | POA: Diagnosis not present

## 2016-10-27 DIAGNOSIS — K652 Spontaneous bacterial peritonitis: Secondary | ICD-10-CM | POA: Diagnosis not present

## 2016-10-27 DIAGNOSIS — D649 Anemia, unspecified: Secondary | ICD-10-CM | POA: Diagnosis not present

## 2016-10-28 DEATH — deceased

## 2017-05-12 IMAGING — US IR PARACENTESIS
1 series · 1 of 1 positions shown · non-contrast
Comparison: none

INDICATION: Cirrhosis, recurrent ascites. Request made for therapeutic
paracentesis.

[Series 1: ir (id) (id)/(id)/(id) ir · 1 of 1 slices shown]
[im 1/1]
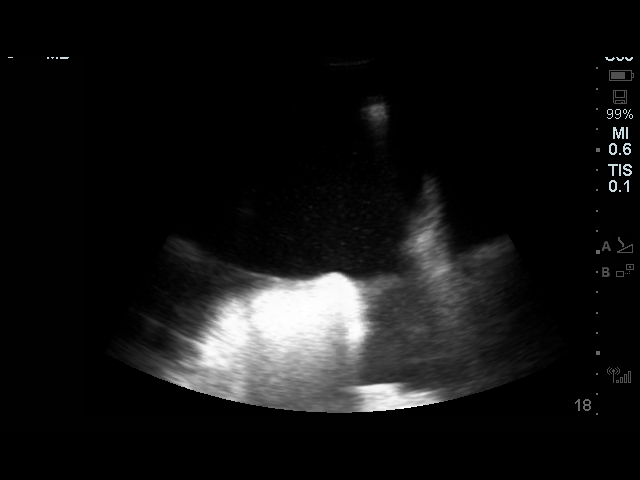

[1 of 1 positions shown; findings below may reference images not displayed]

EXAM:
ULTRASOUND GUIDED THERAPEUTIC PARACENTESIS

MEDICATIONS:
None.

COMPLICATIONS:
None immediate.

PROCEDURE:
Informed written consent was obtained from the patient after a
discussion of the risks, benefits and alternatives to treatment. A
timeout was performed prior to the initiation of the procedure.

Initial ultrasound scanning demonstrates a large amount of ascites
within the right lower abdominal quadrant. The right lower abdomen
was prepped and draped in the usual sterile fashion. 1% lidocaine
was used for local anesthesia.

Following this, a Yueh catheter was introduced. An ultrasound image
was saved for documentation purposes. The paracentesis was
performed. The catheter was removed and a dressing was applied. The
patient tolerated the procedure well without immediate post
procedural complication.
FINDINGS: A total of approximately 9.6 liters of turbid, yellow fluid was
removed.
IMPRESSION: Successful ultrasound-guided therapeutic paracentesis yielding
liters of peritoneal fluid.

## 2017-05-15 IMAGING — US US PARACENTESIS
1 series · 6 of 6 positions shown · non-contrast
Comparison: none

INDICATION: 60-year-old male with a history of cirrhosis and recurrent abdominal
ascites. Presents today for a diagnostic and therapeutic
paracentesis. He does have an 8 L maximum.

[Series 1: us paracentesis · 0.26mm/px · 6 of 6 slices shown]
[im 1/6]
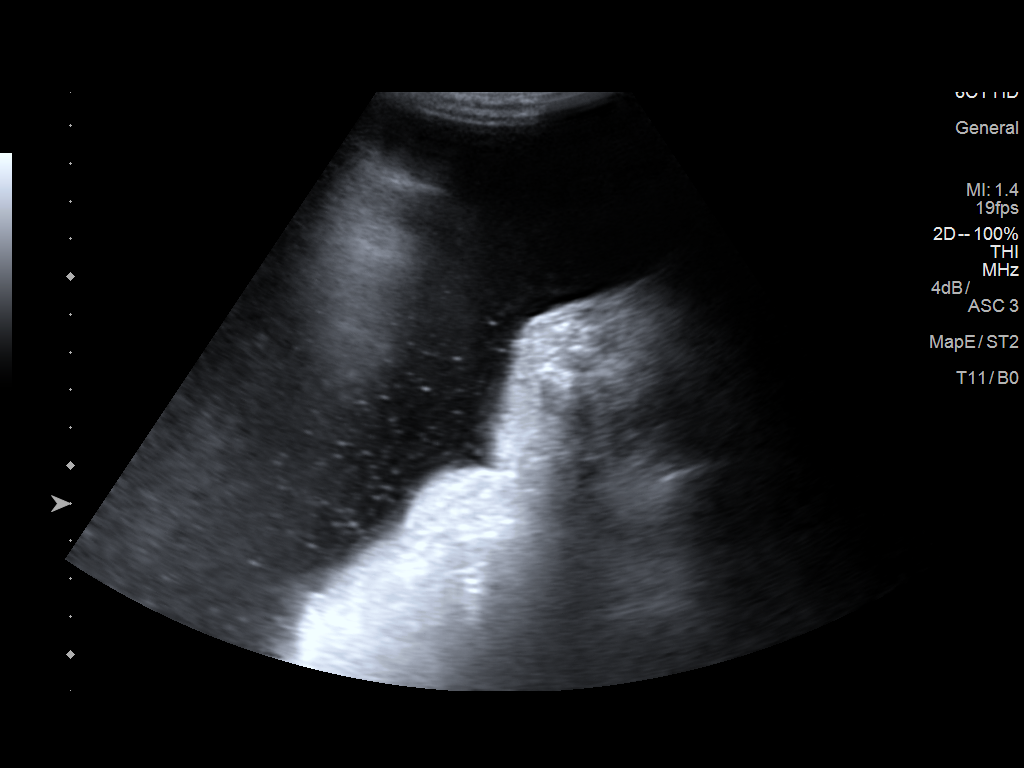
[im 2/6]
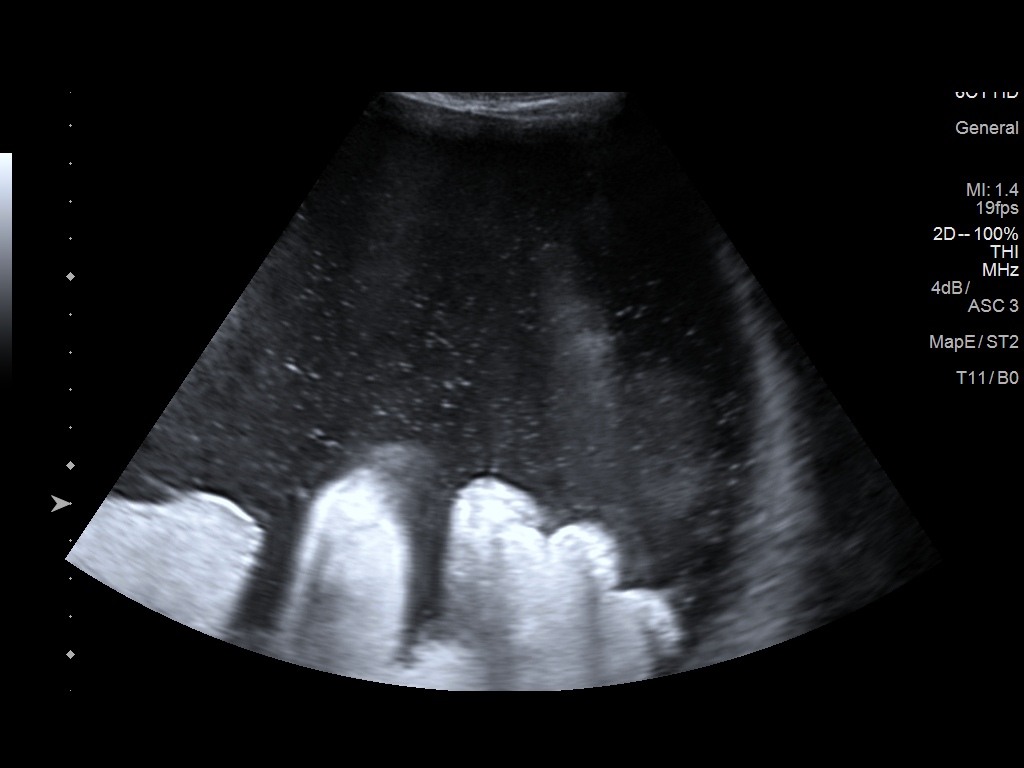
[im 3/6]
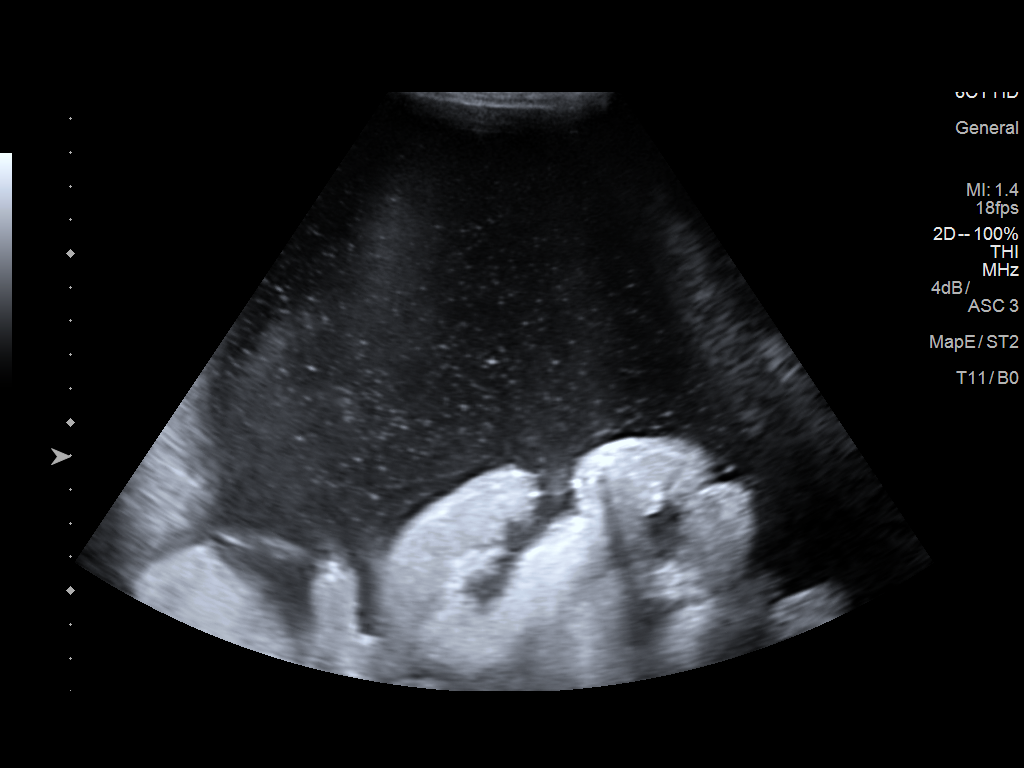
[im 4/6]
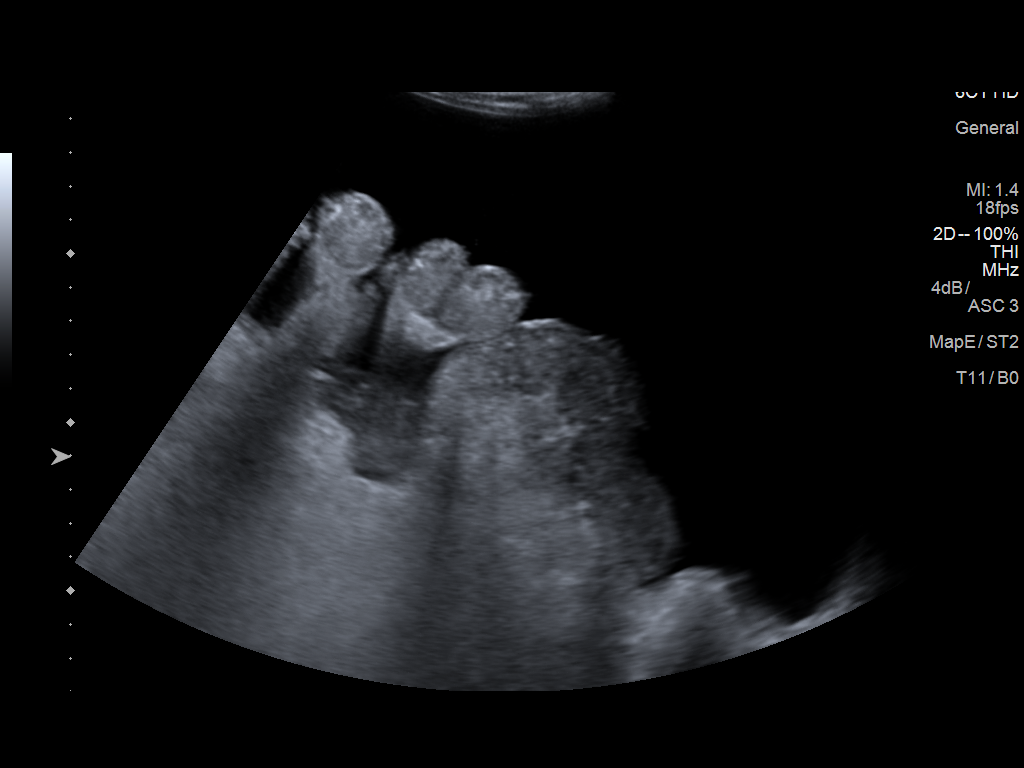
[im 5/6]
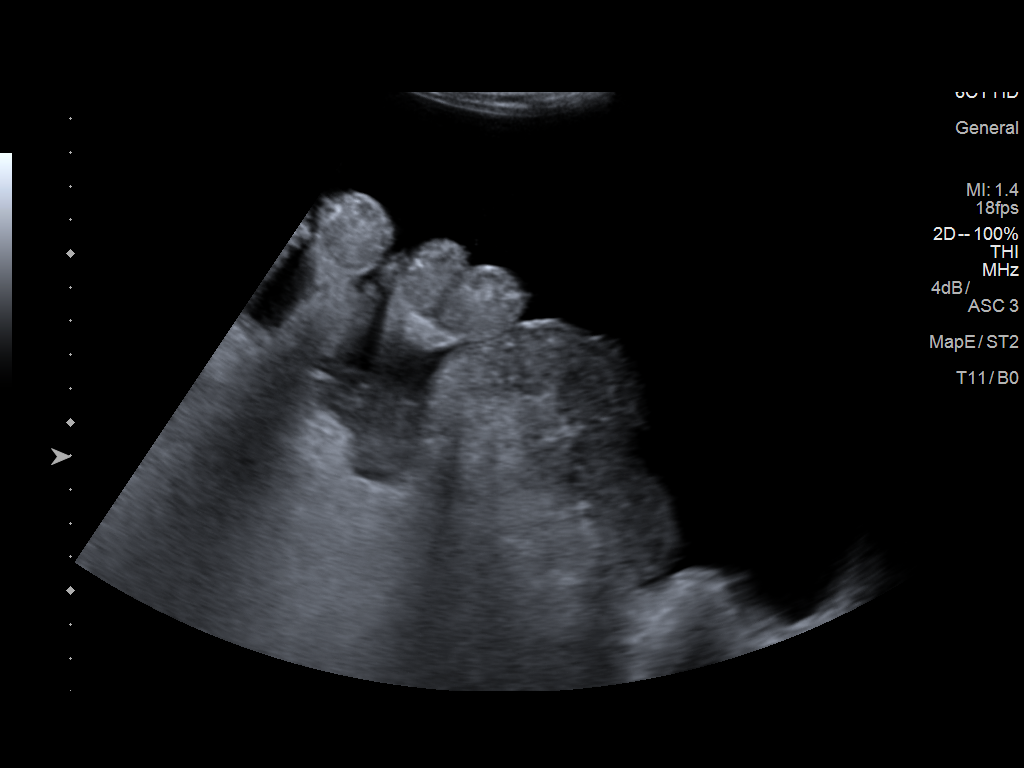
[im 6/6]
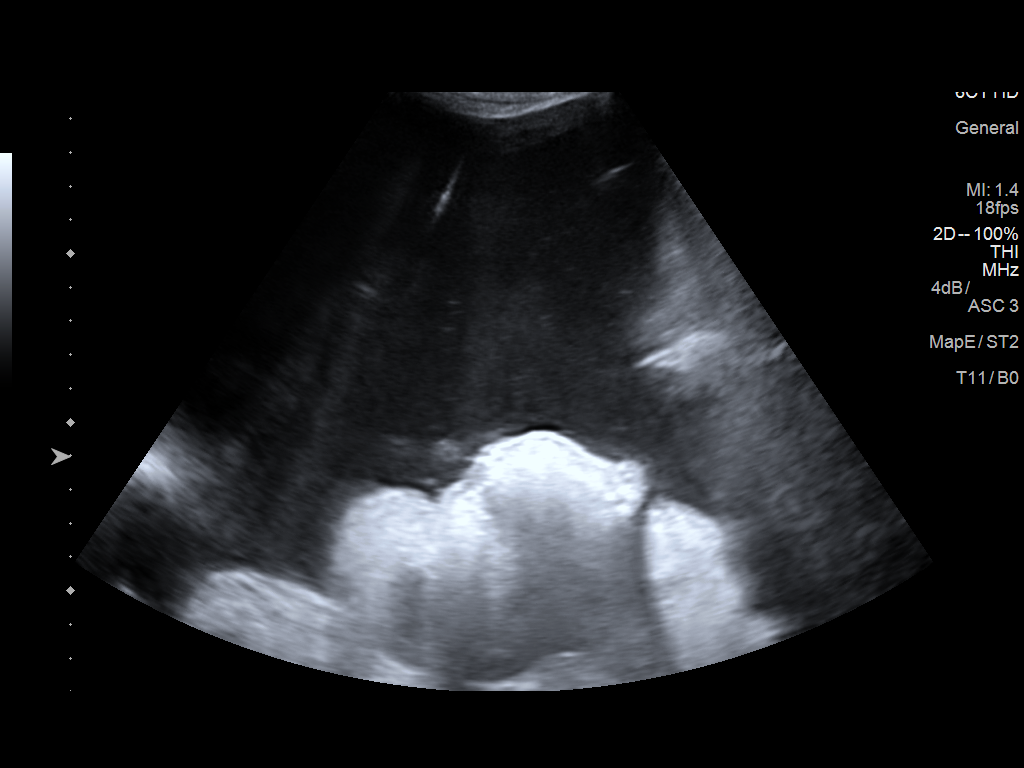

[6 of 6 positions shown; findings below may reference images not displayed]

EXAM:
ULTRASOUND GUIDED DIAGNOSTIC AND THERAPEUTIC PARACENTESIS

MEDICATIONS:
1% lidocaine

COMPLICATIONS:
None immediate.

PROCEDURE:
Informed written consent was obtained from the patient after a
discussion of the risks, benefits and alternatives to treatment. A
timeout was performed prior to the initiation of the procedure.

Initial ultrasound scanning demonstrates a large amount of ascites
within the right lower abdominal quadrant. The right lower abdomen
was prepped and draped in the usual sterile fashion. 1% lidocaine
was used for local anesthesia.

Following this, a 19 gauge, 7-cm, Yueh catheter was introduced. An
ultrasound image was saved for documentation purposes. The
paracentesis was performed. The catheter was removed and a dressing
was applied. The patient tolerated the procedure well without
immediate post procedural complication.
FINDINGS: A total of approximately 8 L of chylous fluid was removed. Samples
were sent to the laboratory as requested by the clinical team.
IMPRESSION: Successful ultrasound-guided paracentesis yielding 8 liters of
peritoneal fluid. He will receive IV albumin after his procedure.

## 2017-07-01 IMAGING — US US PARACENTESIS
1 series · 3 of 3 positions shown · non-contrast
Comparison: Previous paracentesis

MEDICATIONS:
10 cc 1% lidocaine

COMPLICATIONS:
None immediate.

INDICATION: Recurrent ascites

EXAM:
ULTRASOUND-GUIDED PARACENTESIS
TECHNIQUE: Informed written consent was obtained from the patient after a
discussion of the risks, benefits and alternatives to treatment. A
timeout was performed prior to the initiation of the procedure.

[Series 1: us paracentesis · 0.26mm/px · 3 of 3 slices shown]
[im 1/3]
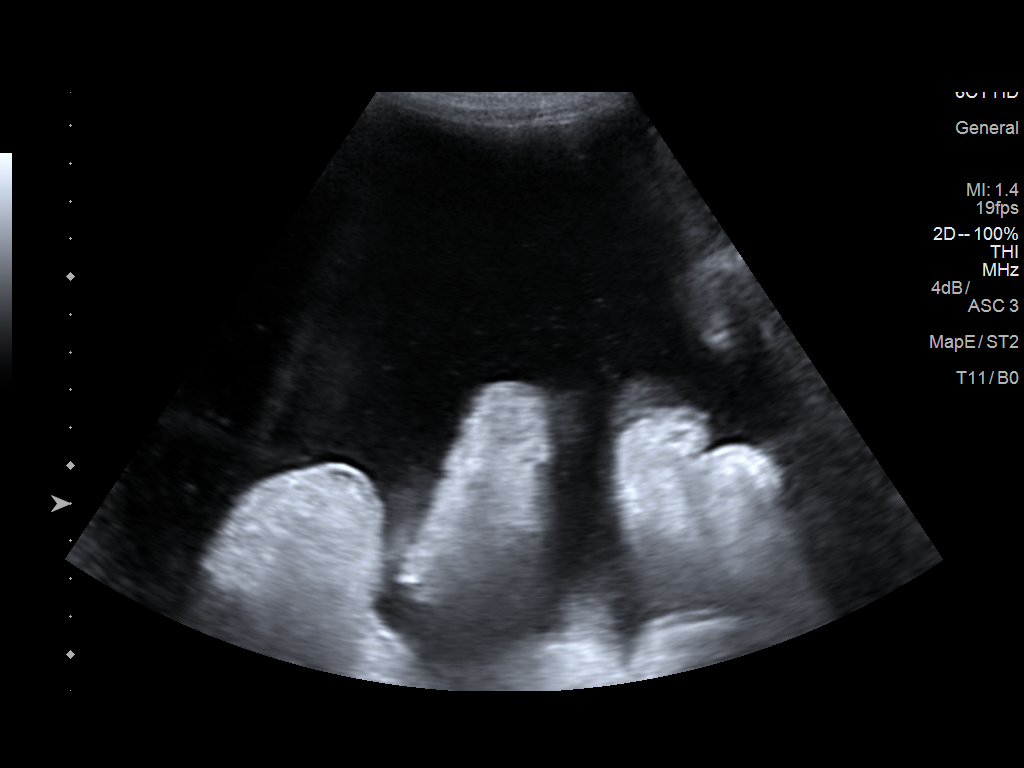
[im 2/3]
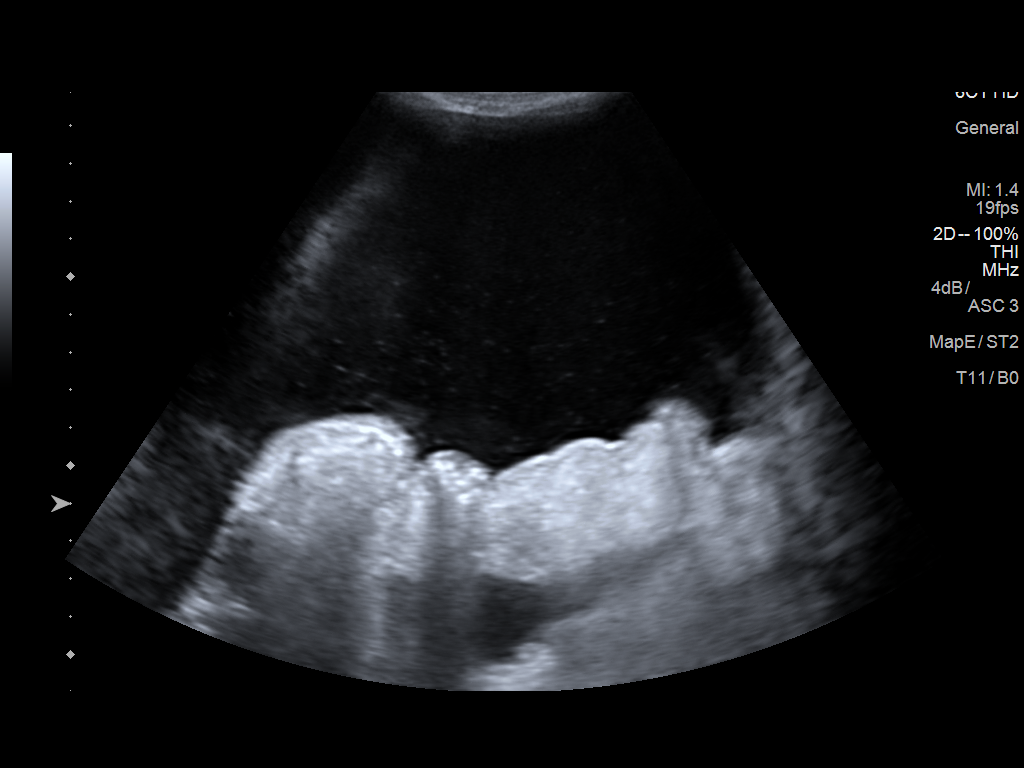
[im 3/3]
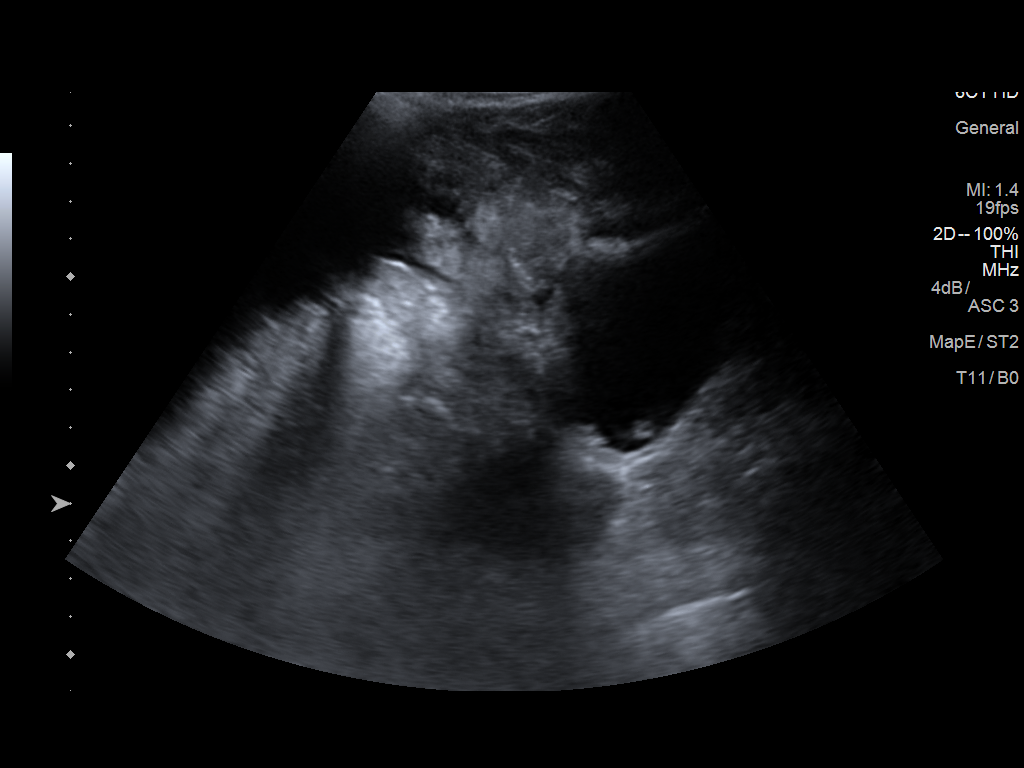

[3 of 3 positions shown; findings below may reference images not displayed]

Initial ultrasound scanning demonstrates a large amount of ascites
within the right lower abdominal quadrant. The right lower abdomen
was prepped and draped in the usual sterile fashion. 1% lidocaine
with epinephrine was used for local anesthesia. Under direct
ultrasound guidance, a 19 gauge, 7-cm, Yueh catheter was introduced.
An ultrasound image was saved for documentation purposed. The
paracentesis was performed. The catheter was removed and a dressing
was applied. The patient tolerated the procedure well without
immediate post procedural complication.
FINDINGS: A total of approximately 10 liters of cloudy yellow fluid was
removed. Samples were sent to the laboratory as requested by the
clinical team.
IMPRESSION: Successful ultrasound-guided paracentesis yielding 10 liters of
peritoneal fluid.

Maximum per LADENA.

Post procedure IV Albumin; 8gr/L= 80 gr IV Albumin post

Read by:

Nomasibulele Moatshe

## 2017-09-25 IMAGING — US US PARACENTESIS
1 series · 2 of 2 positions shown · non-contrast
Comparison: none

INDICATION: Alcoholic cirrhosis with recurrent abdominal ascites. Request is
made for diagnostic and therapeutic paracentesis. Maximum is 8 L.

[Series 1: us paracentesis · 0.26mm/px · 2 of 2 slices shown]
[im 1/2]
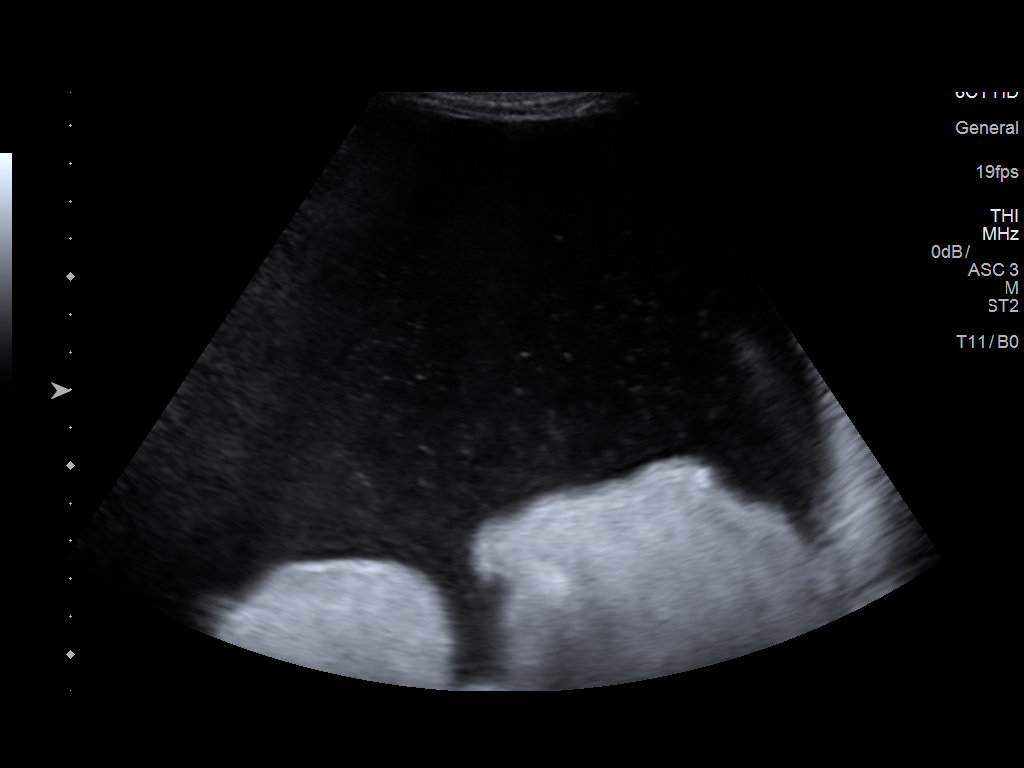
[im 2/2]
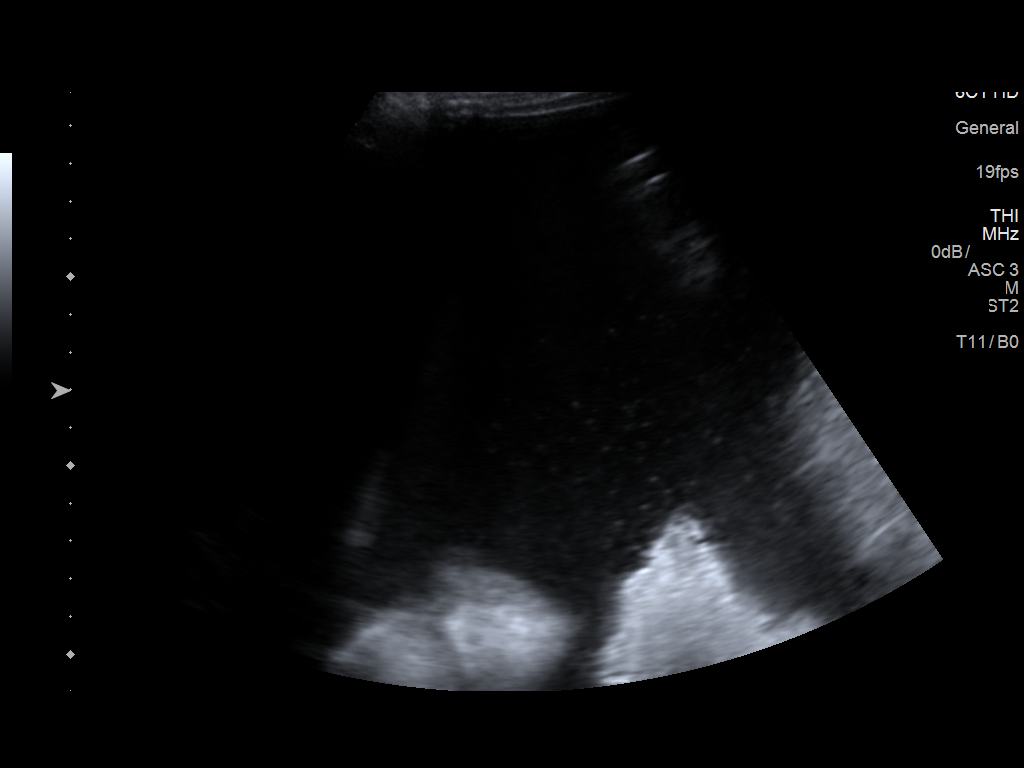

[2 of 2 positions shown; findings below may reference images not displayed]

EXAM:
ULTRASOUND GUIDED DIAGNOSTIC AND THERAPEUTIC PARACENTESIS

MEDICATIONS:
1% lidocaine

COMPLICATIONS:
None immediate.

PROCEDURE:
Informed written consent was obtained from the patient after a
discussion of the risks, benefits and alternatives to treatment. A
timeout was performed prior to the initiation of the procedure.

Initial ultrasound scanning demonstrates a large amount of ascites
within the right lower abdominal quadrant. The right lower abdomen
was prepped and draped in the usual sterile fashion. 1% lidocaine
was used for local anesthesia.

Following this, a 19 gauge, 7-cm, Yueh catheter was introduced. An
ultrasound image was saved for documentation purposes. The
paracentesis was performed. The catheter was removed and a dressing
was applied. The patient tolerated the procedure well without
immediate post procedural complication.
FINDINGS: A total of approximately 8 L of chylous-colored fluid was removed.
Samples were sent to the laboratory as requested by the clinical
team.
IMPRESSION: Successful ultrasound-guided paracentesis yielding 8 liters of
peritoneal fluid.

## 2017-12-02 IMAGING — US US PARACENTESIS
1 series · 6 of 6 positions shown · non-contrast
Comparison: none

INDICATION: Cirrhosis, recurrent ascites. Request made for therapeutic
paracentesis up to 11 liters.

[Series 1: us paracentesis · 0.28mm/px · 6 of 6 slices shown]
[im 1/6]
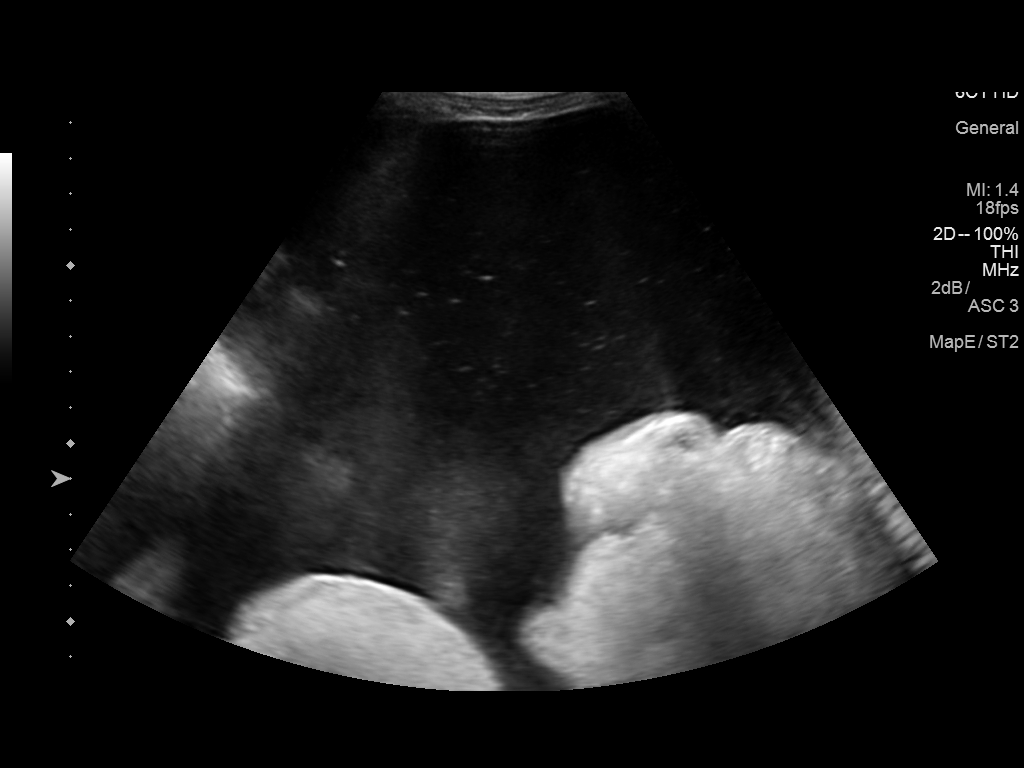
[im 2/6]
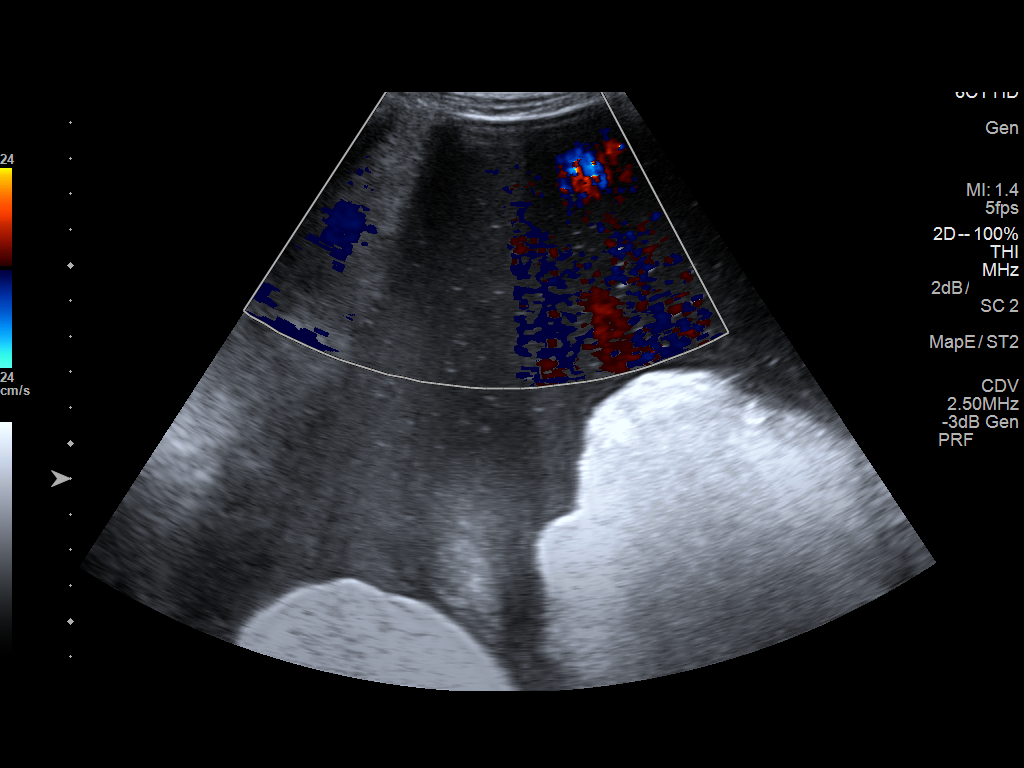
[im 3/6]
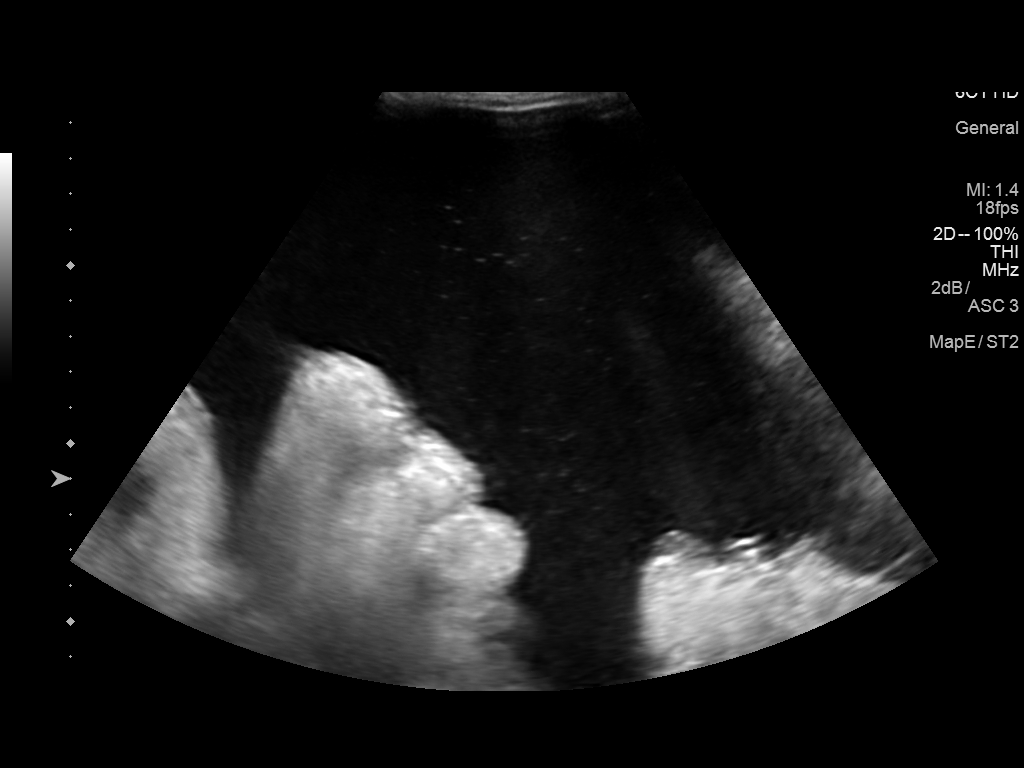
[im 4/6]
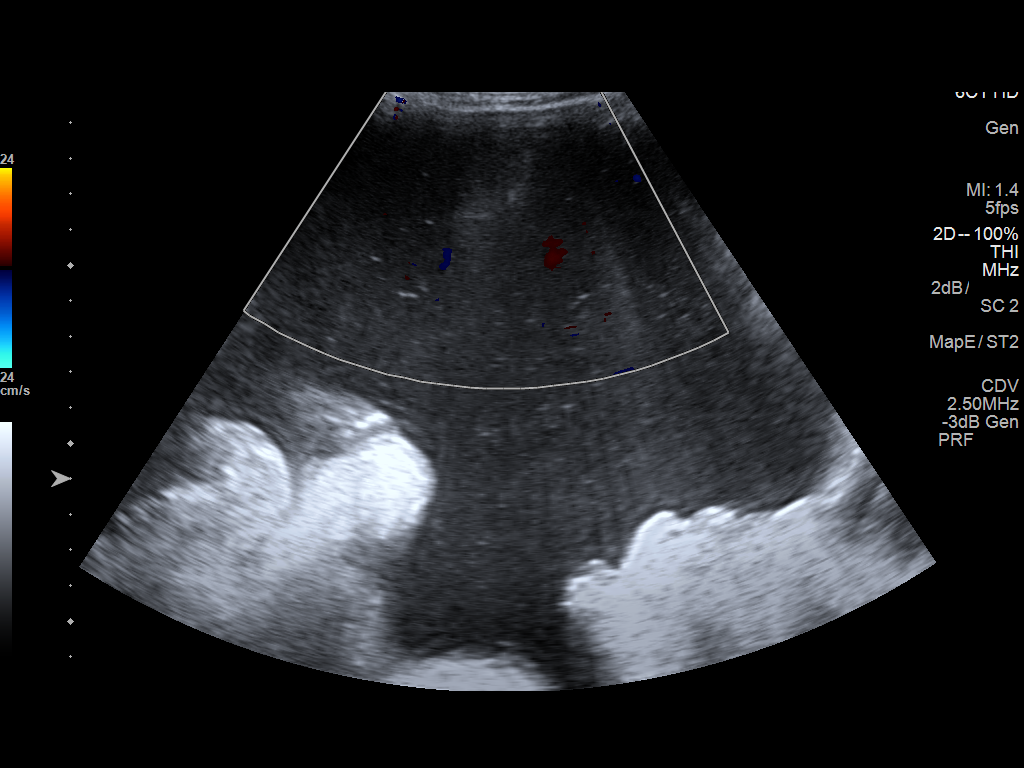
[im 5/6]
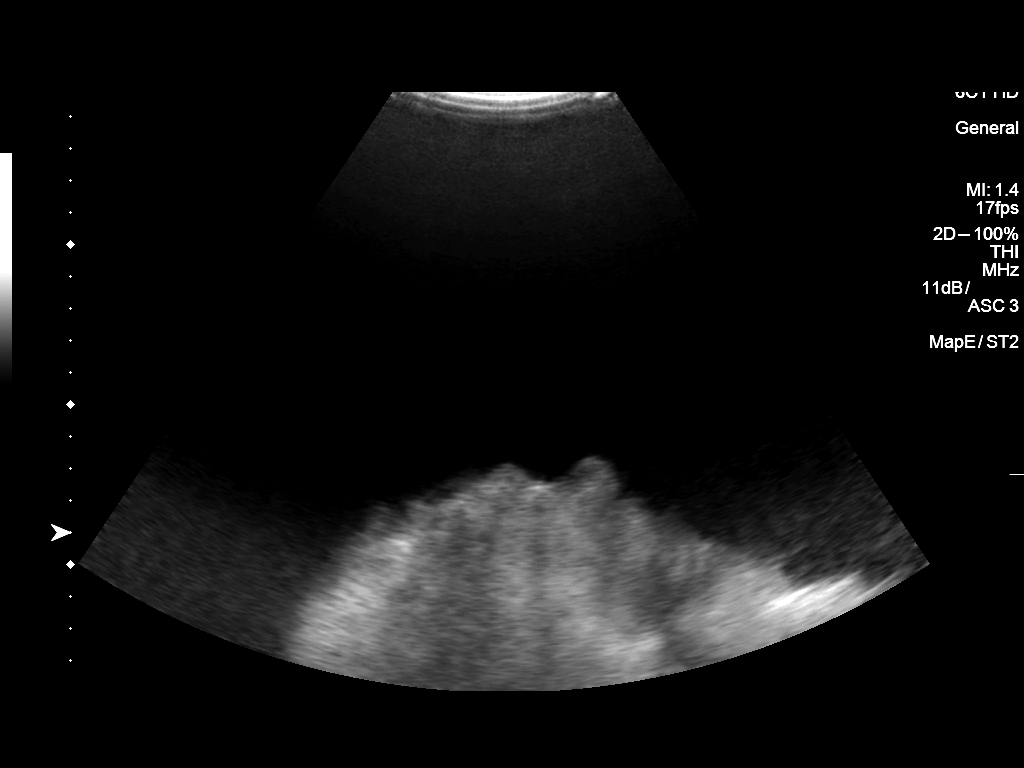
[im 6/6]
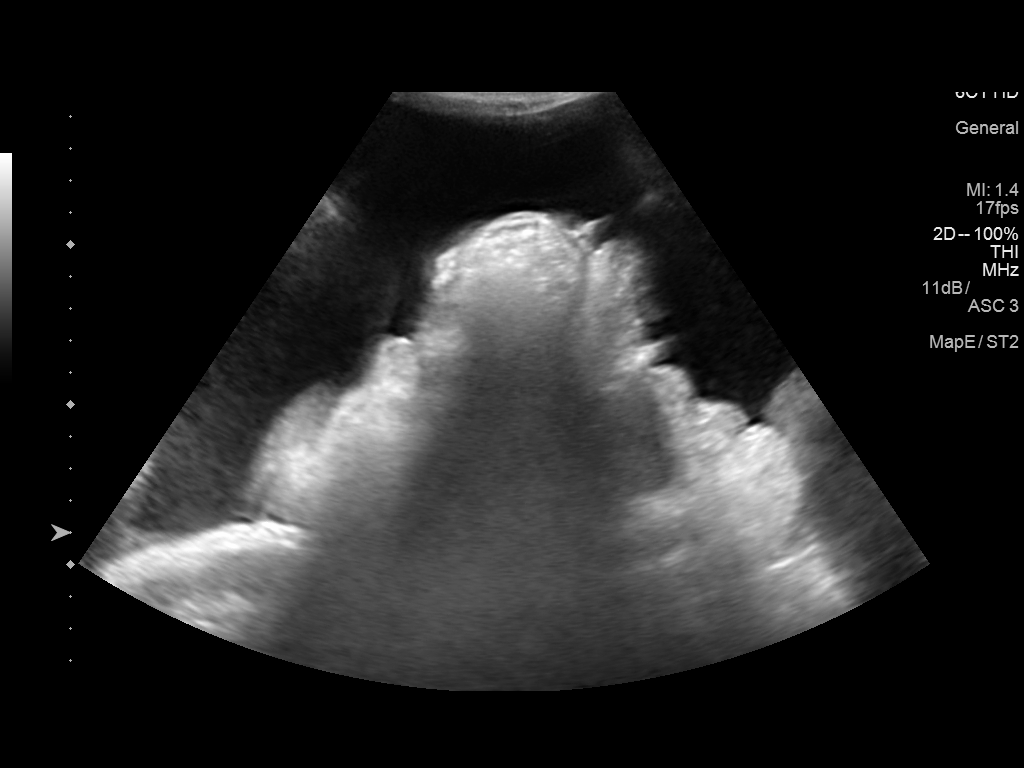

[6 of 6 positions shown; findings below may reference images not displayed]

EXAM:
ULTRASOUND GUIDED THERAPEUTIC PARACENTESIS

MEDICATIONS:
None.

COMPLICATIONS:
None immediate.

PROCEDURE:
Informed written consent was obtained from the patient after a
discussion of the risks, benefits and alternatives to treatment. A
timeout was performed prior to the initiation of the procedure.

Initial ultrasound scanning demonstrates a large amount of ascites
within the left lower abdominal quadrant. The left lower abdomen was
prepped and draped in the usual sterile fashion. 1% lidocaine was
used for local anesthesia.

Following this, a Yueh catheter was introduced. An ultrasound image
was saved for documentation purposes. The paracentesis was
performed. The catheter was removed and a dressing was applied. The
patient tolerated the procedure well without immediate post
procedural complication.
FINDINGS: A total of approximately 11 liters of chylous/milky fluid was
removed.
IMPRESSION: Successful ultrasound-guided therapeutic paracentesis yielding 11
liters of peritoneal fluid.
